# Patient Record
Sex: Female | Born: 1945
Health system: Southern US, Community
[De-identification: ages and names within clinical notes are randomized; demographics above are authoritative.]

## PROBLEM LIST (undated history)

## (undated) DIAGNOSIS — M1990 Unspecified osteoarthritis, unspecified site: Secondary | ICD-10-CM

## (undated) DIAGNOSIS — K219 Gastro-esophageal reflux disease without esophagitis: Secondary | ICD-10-CM

## (undated) DIAGNOSIS — R232 Flushing: Secondary | ICD-10-CM

## (undated) DIAGNOSIS — Z923 Personal history of irradiation: Secondary | ICD-10-CM

## (undated) DIAGNOSIS — E785 Hyperlipidemia, unspecified: Secondary | ICD-10-CM

## (undated) DIAGNOSIS — R7989 Other specified abnormal findings of blood chemistry: Secondary | ICD-10-CM

## (undated) DIAGNOSIS — L409 Psoriasis, unspecified: Secondary | ICD-10-CM

## (undated) DIAGNOSIS — F329 Major depressive disorder, single episode, unspecified: Secondary | ICD-10-CM

## (undated) DIAGNOSIS — C50919 Malignant neoplasm of unspecified site of unspecified female breast: Secondary | ICD-10-CM

## (undated) DIAGNOSIS — Z9289 Personal history of other medical treatment: Secondary | ICD-10-CM

## (undated) DIAGNOSIS — F32A Depression, unspecified: Secondary | ICD-10-CM

## (undated) DIAGNOSIS — I499 Cardiac arrhythmia, unspecified: Secondary | ICD-10-CM

## (undated) DIAGNOSIS — F419 Anxiety disorder, unspecified: Secondary | ICD-10-CM

## (undated) DIAGNOSIS — K589 Irritable bowel syndrome without diarrhea: Secondary | ICD-10-CM

## (undated) DIAGNOSIS — R12 Heartburn: Secondary | ICD-10-CM

## (undated) DIAGNOSIS — I10 Essential (primary) hypertension: Secondary | ICD-10-CM

## (undated) DIAGNOSIS — R61 Generalized hyperhidrosis: Secondary | ICD-10-CM

## (undated) DIAGNOSIS — R0989 Other specified symptoms and signs involving the circulatory and respiratory systems: Secondary | ICD-10-CM

## (undated) DIAGNOSIS — Z9889 Other specified postprocedural states: Secondary | ICD-10-CM

## (undated) HISTORY — PX: ABDOMINAL HYSTERECTOMY: SHX81

## (undated) HISTORY — PX: DILATION AND CURETTAGE OF UTERUS: SHX78

## (undated) HISTORY — DX: Irritable bowel syndrome, unspecified: K58.9

## (undated) HISTORY — PX: UPPER GASTROINTESTINAL ENDOSCOPY: SHX188

## (undated) HISTORY — DX: Personal history of other medical treatment: Z92.89

## (undated) HISTORY — DX: Unspecified osteoarthritis, unspecified site: M19.90

## (undated) HISTORY — DX: Gastro-esophageal reflux disease without esophagitis: K21.9

## (undated) HISTORY — DX: Other specified symptoms and signs involving the circulatory and respiratory systems: R09.89

## (undated) HISTORY — DX: Anxiety disorder, unspecified: F41.9

## (undated) HISTORY — DX: Flushing: R23.2

## (undated) HISTORY — PX: CATARACT EXTRACTION: SUR2

## (undated) HISTORY — DX: Major depressive disorder, single episode, unspecified: F32.9

## (undated) HISTORY — DX: Malignant neoplasm of unspecified site of unspecified female breast: C50.919

## (undated) HISTORY — DX: Depression, unspecified: F32.A

## (undated) HISTORY — DX: Psoriasis, unspecified: L40.9

## (undated) HISTORY — DX: Generalized hyperhidrosis: R61

## (undated) HISTORY — PX: COLONOSCOPY: SHX174

## (undated) HISTORY — PX: EYE SURGERY: SHX253

## (undated) HISTORY — DX: Heartburn: R12

## (undated) HISTORY — DX: Personal history of irradiation: Z92.3

## (undated) HISTORY — DX: Other specified abnormal findings of blood chemistry: R79.89

## (undated) HISTORY — DX: Other specified postprocedural states: Z98.890

## (undated) HISTORY — DX: Hyperlipidemia, unspecified: E78.5

## (undated) HISTORY — PX: TONSILLECTOMY AND ADENOIDECTOMY: SUR1326

## (undated) HISTORY — PX: HAND SURGERY: SHX662

---

## 2002-06-22 ENCOUNTER — Encounter
Admission: RE | Admit: 2002-06-22 | Discharge: 2002-09-16 | Payer: Self-pay | Admitting: Physical Medicine and Rehabilitation

## 2002-10-28 ENCOUNTER — Encounter
Admission: RE | Admit: 2002-10-28 | Discharge: 2002-12-30 | Payer: Self-pay | Admitting: Physical Medicine and Rehabilitation

## 2003-02-02 ENCOUNTER — Ambulatory Visit (HOSPITAL_COMMUNITY): Admission: RE | Admit: 2003-02-02 | Discharge: 2003-02-02 | Payer: Self-pay | Admitting: Gastroenterology

## 2003-12-14 ENCOUNTER — Encounter
Admission: RE | Admit: 2003-12-14 | Discharge: 2004-03-13 | Payer: Self-pay | Admitting: Physical Medicine and Rehabilitation

## 2004-04-25 ENCOUNTER — Encounter (INDEPENDENT_AMBULATORY_CARE_PROVIDER_SITE_OTHER): Payer: Self-pay | Admitting: *Deleted

## 2004-04-25 ENCOUNTER — Ambulatory Visit (HOSPITAL_COMMUNITY): Admission: RE | Admit: 2004-04-25 | Discharge: 2004-04-25 | Payer: Self-pay | Admitting: Gastroenterology

## 2007-06-09 ENCOUNTER — Other Ambulatory Visit: Admission: RE | Admit: 2007-06-09 | Discharge: 2007-06-09 | Payer: Self-pay | Admitting: Family Medicine

## 2007-07-07 ENCOUNTER — Encounter: Admission: RE | Admit: 2007-07-07 | Discharge: 2007-07-07 | Payer: Self-pay | Admitting: Family Medicine

## 2008-08-10 ENCOUNTER — Encounter: Admission: RE | Admit: 2008-08-10 | Discharge: 2008-08-10 | Payer: Self-pay | Admitting: Family Medicine

## 2008-08-16 ENCOUNTER — Encounter: Admission: RE | Admit: 2008-08-16 | Discharge: 2008-08-16 | Payer: Self-pay | Admitting: Family Medicine

## 2008-08-16 ENCOUNTER — Other Ambulatory Visit: Admission: RE | Admit: 2008-08-16 | Discharge: 2008-08-16 | Payer: Self-pay | Admitting: Family Medicine

## 2008-10-19 HISTORY — PX: NM MYOVIEW LTD: HXRAD82

## 2009-02-03 ENCOUNTER — Emergency Department (HOSPITAL_COMMUNITY): Admission: EM | Admit: 2009-02-03 | Discharge: 2009-02-03 | Payer: Self-pay | Admitting: Emergency Medicine

## 2009-08-18 ENCOUNTER — Encounter: Admission: RE | Admit: 2009-08-18 | Discharge: 2009-08-18 | Payer: Self-pay | Admitting: Family Medicine

## 2010-08-03 NOTE — Op Note (Signed)
NAMECLEMENCE, [name]                           ACCOUNT NO.:  [id number]   MEDICAL RECORD NO.:  [id number]                   PATIENT TYPE:  AMB   LOCATION:  ENDO                                 FACILITY:  [facility]   PHYSICIAN:  Graylin Shiver, M.D.                DATE OF BIRTH:  Mar 14, 1946   DATE OF PROCEDURE:  02/02/2003  DATE OF DISCHARGE:                                 OPERATIVE REPORT   PROCEDURE PERFORMED:  Colonoscopy.   INDICATIONS FOR PROCEDURE:  Intermittent rectal bleeding.   Informed consent was obtained after explanation of the risks of bleeding,  infection, and perforation.   PREMEDICATIONS:  Fentanyl 80 mcg  IV, Versed 7 mg IV.   DESCRIPTION OF PROCEDURE:  With the patient in the left lateral decubitus  position, a rectal exam was performed and no masses were felt.  The Olympus  pediatric adjustable colonoscope was inserted into the rectum and advanced  around a very tortuous colon to the cecum.  Cecal landmarks were identified.  The cecum and ascending colon were normal.  The transverse colon normal.  The descending colon, sigmoid and rectum were normal.  The patient tolerated  the procedure well without complications.   IMPRESSION:  Normal colonoscopy to the cecum.                                               Graylin Shiver, M.D.    SFG/MEDQ  D:  02/02/2003  T:  02/03/2003  Job:  [id number]   cc:   Al Decant. Janey Greaser, MD  [address]  [place]  [place] [address]  Fax: [phone]

## 2010-08-03 NOTE — Op Note (Signed)
[name], [name]               ACCOUNT NO.:  [id number]   MEDICAL RECORD NO.:  [id number]          PATIENT TYPE:  AMB   LOCATION:  ENDO                         FACILITY:  [facility]   PHYSICIAN:  Graylin Shiver, M.D.   DATE OF BIRTH:  Mar 11, 1946   DATE OF PROCEDURE:  04/25/2004  DATE OF DISCHARGE:                                 OPERATIVE REPORT   INDICATIONS:  Chest pain, rule out upper GI source.   Informed consent was obtained after explanation of the risks of bleeding,  infection and perforation.   PREMEDICATIONS:  Fentanyl 40 mcg IV, Versed 4 milligrams IV.   PROCEDURE:  With the patient in the left lateral decubitus position, the  Olympus gastroscope was inserted into the oropharynx and passed into the  esophagus. It was advanced down the esophagus and into the stomach and into  the duodenum. The second portion and bulb of the duodenum were normal. The  stomach showed a mild erythematous appearance to the mucosa compatible with  gastritis. No ulcers or erosions were seen. Biopsies were obtained for  histological inspection. No lesions were seen in the fundus or cardia of the  stomach on retroflexion of gastroscope. Scope was straightened and brought  back into the esophagus. The esophagogastric junction was at 40 cm, it  looked normal. The esophageal mucosa looked normal. She tolerated the  procedure well without complications.   IMPRESSION:  Mild gastritis.   I see nothing specifically on this examination to explain the patient's  chest pain. We will continue her on the proton pump inhibitors and also  Zelnorm which she is taking and follow her up in the office to see how she  is doing clinically.      SFG/MEDQ  D:  04/25/2004  T:  04/25/2004  Job:  [id number]   cc:   Al Decant. Janey Greaser, MD  [address]  [place]  [place] [address]  Fax: [phone]

## 2010-09-03 ENCOUNTER — Other Ambulatory Visit: Payer: Self-pay | Admitting: Family Medicine

## 2010-09-03 DIAGNOSIS — Z1231 Encounter for screening mammogram for malignant neoplasm of breast: Secondary | ICD-10-CM

## 2010-09-04 ENCOUNTER — Other Ambulatory Visit: Payer: Self-pay | Admitting: Family Medicine

## 2010-09-04 DIAGNOSIS — M8580 Other specified disorders of bone density and structure, unspecified site: Secondary | ICD-10-CM

## 2010-09-06 ENCOUNTER — Ambulatory Visit
Admission: RE | Admit: 2010-09-06 | Discharge: 2010-09-06 | Disposition: A | Discharge status: Home / Self Care | Payer: BC Managed Care – PPO | Source: Ambulatory Visit | Attending: Family Medicine | Admitting: Family Medicine

## 2010-09-06 DIAGNOSIS — M8580 Other specified disorders of bone density and structure, unspecified site: Secondary | ICD-10-CM

## 2010-09-06 DIAGNOSIS — Z1231 Encounter for screening mammogram for malignant neoplasm of breast: Secondary | ICD-10-CM

## 2011-03-19 DIAGNOSIS — Z923 Personal history of irradiation: Secondary | ICD-10-CM

## 2011-03-19 HISTORY — DX: Personal history of irradiation: Z92.3

## 2011-07-30 ENCOUNTER — Other Ambulatory Visit: Payer: Self-pay | Admitting: Family Medicine

## 2011-07-30 DIAGNOSIS — Z1231 Encounter for screening mammogram for malignant neoplasm of breast: Secondary | ICD-10-CM

## 2011-09-09 ENCOUNTER — Ambulatory Visit
Admission: RE | Admit: 2011-09-09 | Discharge: 2011-09-09 | Disposition: A | Discharge status: Home / Self Care | Payer: BC Managed Care – PPO | Source: Ambulatory Visit | Attending: Family Medicine | Admitting: Family Medicine

## 2011-09-09 DIAGNOSIS — Z1231 Encounter for screening mammogram for malignant neoplasm of breast: Secondary | ICD-10-CM

## 2011-09-11 ENCOUNTER — Other Ambulatory Visit: Payer: Self-pay | Admitting: Family Medicine

## 2011-09-11 DIAGNOSIS — R928 Other abnormal and inconclusive findings on diagnostic imaging of breast: Secondary | ICD-10-CM

## 2011-09-12 ENCOUNTER — Ambulatory Visit
Admission: RE | Admit: 2011-09-12 | Discharge: 2011-09-12 | Disposition: A | Discharge status: Home / Self Care | Payer: BC Managed Care – PPO | Source: Ambulatory Visit | Attending: Family Medicine | Admitting: Family Medicine

## 2011-09-12 ENCOUNTER — Other Ambulatory Visit: Payer: Self-pay | Admitting: Family Medicine

## 2011-09-12 DIAGNOSIS — R928 Other abnormal and inconclusive findings on diagnostic imaging of breast: Secondary | ICD-10-CM

## 2011-09-24 ENCOUNTER — Ambulatory Visit
Admission: RE | Admit: 2011-09-24 | Discharge: 2011-09-24 | Disposition: A | Discharge status: Home / Self Care | Payer: BC Managed Care – PPO | Source: Ambulatory Visit | Attending: Family Medicine | Admitting: Family Medicine

## 2011-09-24 DIAGNOSIS — R928 Other abnormal and inconclusive findings on diagnostic imaging of breast: Secondary | ICD-10-CM

## 2011-09-25 ENCOUNTER — Other Ambulatory Visit: Payer: Self-pay | Admitting: Family Medicine

## 2011-09-25 DIAGNOSIS — R928 Other abnormal and inconclusive findings on diagnostic imaging of breast: Secondary | ICD-10-CM

## 2011-09-26 ENCOUNTER — Ambulatory Visit
Admission: RE | Admit: 2011-09-26 | Discharge: 2011-09-26 | Disposition: A | Discharge status: Home / Self Care | Payer: BC Managed Care – PPO | Source: Ambulatory Visit | Attending: Family Medicine | Admitting: Family Medicine

## 2011-09-26 ENCOUNTER — Other Ambulatory Visit: Payer: Self-pay | Admitting: Family Medicine

## 2011-09-26 DIAGNOSIS — C50912 Malignant neoplasm of unspecified site of left female breast: Secondary | ICD-10-CM

## 2011-09-26 DIAGNOSIS — R928 Other abnormal and inconclusive findings on diagnostic imaging of breast: Secondary | ICD-10-CM

## 2011-09-27 ENCOUNTER — Other Ambulatory Visit: Payer: Self-pay | Admitting: *Deleted

## 2011-09-27 ENCOUNTER — Telehealth: Payer: Self-pay | Admitting: *Deleted

## 2011-09-27 DIAGNOSIS — C50419 Malignant neoplasm of upper-outer quadrant of unspecified female breast: Secondary | ICD-10-CM

## 2011-09-27 NOTE — Telephone Encounter (Signed)
Confirmed [facility] for 10/02/11 at 1200 .  Instructions and contact information given.

## 2011-09-30 ENCOUNTER — Ambulatory Visit
Admission: RE | Admit: 2011-09-30 | Discharge: 2011-09-30 | Disposition: A | Discharge status: Home / Self Care | Payer: BC Managed Care – PPO | Source: Ambulatory Visit | Attending: Family Medicine | Admitting: Family Medicine

## 2011-09-30 DIAGNOSIS — C50912 Malignant neoplasm of unspecified site of left female breast: Secondary | ICD-10-CM

## 2011-09-30 MED ORDER — GADOBENATE DIMEGLUMINE 529 MG/ML IV SOLN
12.0000 mL | Freq: Once | INTRAVENOUS | Status: AC | PRN
  Administered 2011-09-30: 12 mL via INTRAVENOUS

## 2011-10-02 ENCOUNTER — Encounter: Payer: Self-pay | Admitting: *Deleted

## 2011-10-02 ENCOUNTER — Encounter (INDEPENDENT_AMBULATORY_CARE_PROVIDER_SITE_OTHER): Payer: Self-pay | Admitting: General Surgery

## 2011-10-02 ENCOUNTER — Ambulatory Visit: Payer: BC Managed Care – PPO | Attending: General Surgery | Admitting: Physical Therapy

## 2011-10-02 ENCOUNTER — Ambulatory Visit (HOSPITAL_BASED_OUTPATIENT_CLINIC_OR_DEPARTMENT_OTHER): Payer: BC Managed Care – PPO

## 2011-10-02 ENCOUNTER — Other Ambulatory Visit: Payer: BC Managed Care – PPO | Admitting: Lab

## 2011-10-02 ENCOUNTER — Ambulatory Visit: Payer: BC Managed Care – PPO

## 2011-10-02 ENCOUNTER — Telehealth: Payer: Self-pay | Admitting: *Deleted

## 2011-10-02 ENCOUNTER — Encounter: Payer: Self-pay | Admitting: Oncology

## 2011-10-02 ENCOUNTER — Ambulatory Visit (HOSPITAL_BASED_OUTPATIENT_CLINIC_OR_DEPARTMENT_OTHER): Payer: BC Managed Care – PPO | Admitting: Oncology

## 2011-10-02 ENCOUNTER — Ambulatory Visit
Admission: RE | Admit: 2011-10-02 | Discharge: 2011-10-02 | Disposition: A | Discharge status: Home / Self Care | Payer: BC Managed Care – PPO | Source: Ambulatory Visit | Attending: Radiation Oncology | Admitting: Radiation Oncology

## 2011-10-02 ENCOUNTER — Ambulatory Visit (HOSPITAL_BASED_OUTPATIENT_CLINIC_OR_DEPARTMENT_OTHER): Payer: BC Managed Care – PPO | Admitting: General Surgery

## 2011-10-02 VITALS — BP 147/78 | HR 88 | Temp 97.9°F

## 2011-10-02 DIAGNOSIS — IMO0001 Reserved for inherently not codable concepts without codable children: Secondary | ICD-10-CM | POA: Insufficient documentation

## 2011-10-02 DIAGNOSIS — C50919 Malignant neoplasm of unspecified site of unspecified female breast: Secondary | ICD-10-CM | POA: Insufficient documentation

## 2011-10-02 DIAGNOSIS — M25619 Stiffness of unspecified shoulder, not elsewhere classified: Secondary | ICD-10-CM | POA: Insufficient documentation

## 2011-10-02 DIAGNOSIS — C50419 Malignant neoplasm of upper-outer quadrant of unspecified female breast: Secondary | ICD-10-CM

## 2011-10-02 DIAGNOSIS — M25519 Pain in unspecified shoulder: Secondary | ICD-10-CM | POA: Insufficient documentation

## 2011-10-02 LAB — COMPREHENSIVE METABOLIC PANEL WITH GFR
ALT: 10 U/L (ref 0–35)
AST: 15 U/L (ref 0–37)
Albumin: 3.8 g/dL (ref 3.5–5.2)
Alkaline Phosphatase: 73 U/L (ref 39–117)
BUN: 14 mg/dL (ref 6–23)
CO2: 28 meq/L (ref 19–32)
Calcium: 9.4 mg/dL (ref 8.4–10.5)
Chloride: 101 meq/L (ref 96–112)
Creatinine, Ser: 0.8 mg/dL (ref 0.50–1.10)
Glucose, Bld: 92 mg/dL (ref 70–99)
Potassium: 3.7 meq/L (ref 3.5–5.3)
Sodium: 139 meq/L (ref 135–145)
Total Bilirubin: 0.2 mg/dL — ABNORMAL LOW (ref 0.3–1.2)
Total Protein: 7.2 g/dL (ref 6.0–8.3)

## 2011-10-02 LAB — CBC WITH DIFFERENTIAL/PLATELET
EOS%: 6.3 % (ref 0.0–7.0)
Eosinophils Absolute: 0.4 10*3/uL (ref 0.0–0.5)
MCHC: 34 g/dL (ref 31.5–36.0)
MCV: 94.1 fL (ref 79.5–101.0)
Platelets: 293 10*3/uL (ref 145–400)
RBC: 4.3 10*6/uL (ref 3.70–5.45)

## 2011-10-02 MED ORDER — VENLAFAXINE HCL ER 37.5 MG PO CP24: 37.5000 mg | ORAL_CAPSULE | Freq: Every day | ORAL | Status: DC

## 2011-10-02 NOTE — Telephone Encounter (Signed)
Call from pt confirming appt time. Reviewed with pt, to arrive at 12:00pm  appt with Financial, lab then she will be seen by Dr.Khan, Dr. Mitzi Hansen & Dr. Carolynne Edouard. Pt confirmed appt time, verbalized understanding.

## 2011-10-02 NOTE — Telephone Encounter (Signed)
gave patient appointment for 11-05-2011 starting at 1:30pm printed out calendar and gave to the patient

## 2011-10-02 NOTE — Progress Notes (Signed)
[name] [id number] October 01, 1945 66 y.o. 10/02/2011 4:25 PM  CC Dr. Chevis Pretty  Dr. Dorothy Puffer Dr. Catha Gosselin Dr. [name]  REASON FOR CONSULTATION:  66 year old female with new diagnosis of invasive ductal carcinoma grade one of the left breast found on routine screening mammogram. Patient was seen in the Multidisciplinary Breast Clinic for discussion of her treatment options.   STAGE:   Cancer of upper-outer quadrant of female breast   Primary site: Breast (Left)   Staging method: AJCC 7th Edition   Clinical: Stage IA (T66mic, N0, cM0)   Summary: Stage IA (T72mic, N0, cM0)  REFERRING PHYSICIAN: Dr. Chevis Pretty  HISTORY OF PRESENT ILLNESS:  [name] is a 66 y.o. female.  With medical history significant for IBS arthritis ocular disease and gastroesophageal reflux disease. Patient recently presented for an annual screening mammogram on 09/10/2011. On this mammogram patient was found to have a new small cluster of pleomorphic microcalcifications located within the upper outer quadrant of the left breast measuring 3 mm. Because of this biopsy was recommended. Patient went on to have a core needle biopsy performed. The pathology showed invasive ductal carcinoma with mucinous features atypical ductal hyperplasia with calcifications and fibrocystic changes with calcifications. Prognostic markers could not be obtained to 2 small sample size. Patient went on to have MRI of the breasts performed on 09/30/2011. The MRI showed foci of nonspecific enhancement bilaterally there was about biopsy clip and a 1.4 cm postbiopsy hematoma in the upper outer quadrant of the left breast posteriorly no significant residual enhancement was seen in the upper outer quadrant of the left breast no suspicious mass or enhancement in either breast no axillary or internal mammary adenopathy.Patient has been on hormone replacement therapy and it was recommended today that she continue this as well.  Patient is  now seen in the multidisciplinary breast clinic for discussion of treatment options. Her case was presented at the multidisciplinary breast conference all of her pathology and images were reviewed at the conference and add pre-clinic conference.   Past Medical History: Past Medical History  Diagnosis Date  . IBS (irritable bowel syndrome)   . Arthritis   . Breast cancer   . GERD (gastroesophageal reflux disease)   . H/O colonoscopy   . H/O bone density study   . Night sweats   . Sinus complaint   . Heartburn   . Psoriasis   . Anxiety   . Depression   . Hot flashes     Past Surgical History: Past Surgical History  Procedure Date  . Cataract extraction   . Tonsillectomy and adenoidectomy   . Dilation and curettage of uterus   . Abdominal hysterectomy   . Hand surgery     RSD    Family History:Maternal cousin had colon cancer at the age of 34 and a paternal grandmother with breast cancer unknown stage. No family history on file.  Social History History  Substance Use Topics  . Smoking status: Former Smoker -- 1.0 packs/day  . Smokeless tobacco: Not on file  . Alcohol Use: 4.2 oz/week    7 Glasses of wine per week    Allergies: No Known Allergies  Current Medications: Current Outpatient Prescriptions  Medication Sig Dispense Refill  . BIOTIN PO Take 550 mg by mouth.      Marland Kitchen buPROPion (WELLBUTRIN SR) 150 MG 12 hr tablet Take 150 mg by mouth 2 (two) times daily.      . calcium carbonate 200 MG capsule Take 250  mg by mouth 2 (two) times daily with a meal.      . co-enzyme Q-10 30 MG capsule Take 300 mg by mouth daily.      . diclofenac sodium (VOLTAREN) 1 % GEL Apply 1 application topically.      Marland Kitchen estradiol (ESTRACE) 1 MG tablet Take 1 mg by mouth daily.      Marland Kitchen ezetimibe (ZETIA) 10 MG tablet Take 10 mg by mouth daily.      Marland Kitchen gabapentin (NEURONTIN) 100 MG capsule Take 100 mg by mouth as needed.      Marland Kitchen ibuprofen (ADVIL,MOTRIN) 200 MG tablet Take 200 mg by mouth every  6 (six) hours as needed.      Marland Kitchen Linaclotide (LINZESS) 145 MCG CAPS Take by mouth daily.      . Multiple Vitamin (MULTIVITAMIN) tablet Take 1 tablet by mouth daily.      . Omega-3 Fatty Acids (FISH OIL PO) Take 1 each by mouth.      Marland Kitchen omeprazole (PRILOSEC) 20 MG capsule Take 20 mg by mouth daily.      . quinapril-hydrochlorothiazide (ACCURETIC) 10-12.5 MG per tablet Take 1 tablet by mouth daily.      . rifaximin (XIFAXAN) 550 MG TABS Take 550 mg by mouth.      . tretinoin (RETIN-A) 0.05 % cream Apply topically at bedtime.      Marland Kitchen venlafaxine XR (EFFEXOR-XR) 37.5 MG 24 hr capsule Take 1 capsule (37.5 mg total) by mouth daily.  30 capsule  6    OB/GYN History:Menarche at age 9 patient underwent a hysterectomy at age 68 due to endometriosis. After that she began hormone replacement therapy and her discontinuation date is 10/02/2011. Patient has had 2 full term pregnancies starting at the age of 71.  Fertility Discussion:Patient is postmenopausal Prior History of Cancer:No prior history of cancers  Health Maintenance:  Colonoscopy yes Bone DensityYes yes in June 2012 Last PAP smearYes on June 2013  Patient began having her first mammogram starting at the age of 44 and yearly thereafter.  ECOG PERFORMANCE STATUS: 1 - Symptomatic but completely ambulatory  Genetic Counseling/testing: Patient is a postmenopausal female without significant family history of breast cancer and therefore genetic counseling and testing is not recommended at this time.  REVIEW OF SYSTEMS:  Constitutional: positive for night sweats Ears, nose, mouth, throat, and face: positive for patient has 2 retinal surgeries and has a macular hole as well as to cataract extractions she also has some gum disease and she does have some ringing in the ears at times. Respiratory: negative Cardiovascular: negative Gastrointestinal: positive for reflux symptoms and irritable bowel syndrome Integument/breast: positive for breast  tenderness and redness and ecchymosis at the site of biopsy Hematologic/lymphatic: negative Musculoskeletal:positive for arthralgias, back pain and stiff joints Neurological: positive for memory problems, weakness and numbness at times Behavioral/Psych: positive for anxiety and depression Endocrine: negative since coming off of the hormone replacement therapy patient has developed significant hot flashes. Patient also has complex regional pain syndrome (RSD)  PHYSICAL EXAMINATION: Blood pressure 147/78, pulse 88, temperature 97.9 F (36.6 C), temperature source Oral.  ZOX:WRUEA, well developed and anxious SKIN: skin color, texture, turgor are normal [name]: Normocephalic EYES: PERRLA, EOMI, Conjunctiva are pink and non-injected EARS: External ears normal OROPHARYNX:no exudate, no erythema, lips, buccal mucosa, and tongue normal and dentition normal  NECK: supple, no adenopathy, no bruits, no JVD, thyroid normal size, non-tender, without nodularity LYMPH:  no palpable lymphadenopathy, no hepatosplenomegaly BREAST:right breast normal without mass,  skin or nipple changes or axillary nodes, Left breast reveals area of hematoma is palpable. LUNGS: clear to auscultation and percussion HEART: regular rate & rhythm, no murmurs and no gallops ABDOMEN:abdomen soft, non-tender, normal bowel sounds and no masses or organomegaly BACK: Back symmetric, no curvature., No CVA tenderness EXTREMITIES:no edema, no clubbing, no cyanosis  NEURO: alert & oriented x 3 with fluent speech, no focal motor/sensory deficits, gait normal, reflexes normal and symmetric     STUDIES/RESULTS: Mr Breast Bilateral W Wo Contrast  09/30/2011  *RADIOLOGY REPORT*  Clinical Data: New diagnosis left sided breast cancer (small focus IDC with mucinous features and ADH/calcifications at the biopsy site).  BILATERAL BREAST MRI WITH AND WITHOUT CONTRAST  Technique: Multiplanar, multisequence MR images of both breasts were obtained  prior to and following the intravenous administration of 12ml of Multihance.  Three dimensional images were evaluated at the independent DynaCad workstation.  Comparison:  Multiple prior mammograms most recent screening mammogram  09/10/2011  Findings: Foci of nonspecific enhancement are seen bilaterally. Biopsy clip and a 1.4 cm postbiopsy hematoma and are seen in the upper-outer quadrant of the left breast, posteriorly.  No significant residual enhancement is seen in the upper outer quadrant of the left breast. No other suspicious mass or enhancement is seen in either breast.  There is no axillary or internal mammary adenopathy.  IMPRESSION: Known malignancy, left breast without significant residual enhancement seen on MRI.  No MRI specific evidence of malignancy, right breast.  RECOMMENDATION: Recommend treatment planning, left breast.  THREE-DIMENSIONAL MR IMAGE RENDERING ON INDEPENDENT WORKSTATION:  Three-dimensional MR images were rendered by post-processing of the original MR data on an independent workstation.  The three- dimensional MR images were interpreted, and findings were reported in the accompanying complete MRI report for this study.  BI-RADS CATEGORY 6:  Known biopsy-proven malignancy - appropriate action should be taken.  Original Report Authenticated By: Hiram Gash, M.D.   Mm Digital Diag Ltd L  09/12/2011  *RADIOLOGY REPORT*  Clinical Data:  Recall from screening mammography.  DIGITAL DIAGNOSTIC LEFT BREAST PILL MAMMOGRAM  Comparison:  None.  Findings:  IMPRESSION:  RECOMMENDATION:  *RADIOLOGY REPORT*  Clinical Data:  Recall from screening mammography.  DIGITAL DIAGNOSTIC LEFT BREAST MAMMOGRAM  Comparison:  09/09/2011, 09/12/2010, 09/06/2010, 08/18/2009, 08/16/2008 and 08/10/2008, 07/07/2007.  Findings:  There is a new small cluster of pleomorphic microcalcifications located within the upper-outer quadrant of the left breast which measures 3 mm in greatest dimension. Given the change  and appearance tissue sampling is recommended.  I have discussed stereotactic core biopsy with the patient.  This will be scheduled per patient preference.  A second smaller cluster of punctate calcifications is present more posteriorly located which appears stable when compared to prior studies.  IMPRESSION: Tiny (3 mm) cluster of calcifications located within the upper- outer quadrant of the left breast as discussed above.  Tissue sampling is recommended and stereotactic core biopsy will be scheduled.  RECOMMENDATION: Recommend stereotactic core biopsy of left breast calcifications.  BI-RADS CATEGORY 4:  Suspicious abnormality - biopsy should be considered.  Original Report Authenticated By: Rolla Plate, M.D.   Mm Digital Screening  09/10/2011  *RADIOLOGY REPORT*  Clinical Data: Screening.  DIGITAL SCREENING MAMMOGRAM WITH CAD  DIGITAL BREAST TOMOSYNTHESIS  Digital breast tomosynthesis images are acquired in two projections.  These images are reviewed in combination with the digital mammogram, confirming the findings below.  Comparison:  Prior exams  Findings: Two views of each breast demonstrate heterogeneously dense tissue .  In the left breast, calcifications warrant further evaluation with magnified views.  In the right breast, no mass or malignant type calcifications are identified.  Images were processed with CAD.  IMPRESSION: Further evaluation is suggested for calcifications in the left breast.  RECOMMENDATION: Diagnostic mammogram of the left breast. (Code:FI-L-50M)  BI-RADS CATEGORY 0:  Incomplete.  Need additional imaging evaluation and/or prior mammograms for comparison.  Original Report Authenticated By: Valentina Gu Radiologist Eval And Mgmt  09/26/2011  *RADIOLOGY REPORT*  ESTABLISHED PATIENT OFFICE VISIT - LEVEL II ([id number])  Chief Complaint:  The patient returns with her husband today for pathology results of of left breast biopsy.  History:  Calcifications identified in the left breast on  recent screening mammogram.  Biopsy is recommended.The patient reports no problems at the biopsy site.  Exam:  The biopsy site is clean and dry.  A new Band-Aid was applied.  There is no bruising or hematoma.  Pathology: Pathology results show invasive ductal carcinoma with mucinous features.  Atypical ductal hyperplasia with calcifications, and fibrocystic changes with calcifications.  The pathologist notes that the carcinoma identified on biopsy is quite small.  Assessment and Plan:  The patient will be seen in the multidisciplinary breast cancer clinic at [facility] on 10/02/2011.  Breast MRI scheduled for 09/30/2011.  Their questions were answered, and the patient was given an Public house manager.  Original Report Authenticated By: Britta Mccreedy, M.D.     LABS:    Chemistry   No results found for this basename: NA, K, CL, CO2, BUN, CREATININE, GLU   No results found for this basename: CALCIUM, ALKPHOS, AST, ALT, BILITOT      Lab Results  Component Value Date   WBC 6.7 10/02/2011   HGB 13.8 10/02/2011   HCT 40.4 10/02/2011   MCV 94.1 10/02/2011   PLT 293 10/02/2011    PATHOLOGY:  Collection Date: 09/24/2011 Patient Name: [name], [name] Received Date: 09/24/2011 Case No: [id number] Physician cc: Joycelyn Rua, MD MRN #: [id number] Chart #: [id number] DOB: 1945/11/20 Age: 56 Gender: F Client Name: [facility] Physician: Stoney Bang, MD REPORT OF SURGICAL PATHOLOGY ADDITIONAL INFORMATION: PROGNOSTIC INDICATORS - ACIS Results There is insufficient carcinoma present for analysis. All controls stained appropriately Pecola Leisure MD Pathologist, Electronic Signature ( Signed 10/02/2011) CHROMOGENIC IN-SITU HYBRIDIZATION Interpretation: There is insufficient carcinoma present for Her2 analysis. Reference range: Ratio: HER2:CEP17 < 1.8 - gene amplification not observed Ratio: HER2:CEP 17 1.8-2.2 - equivocal result Ratio: HER2:CEP17 > 2.2 -  gene amplification observed Pecola Leisure MD Pathologist, Electronic Signature ( Signed 10/01/2011) FINAL DIAGNOSIS 1 of 3 FINAL for [name], [name] ([id number]) Diagnosis Breast, left, needle core biopsy, UOQ - INVASIVE DUCTAL CARCINOMA WITH MUCINOUS FEATURES. - ATYPICAL HYPERPLASIA WITH ASSOCIATED CALCIFICATION. - FIBROCYSTIC CHANGES WITH ASSOCIATED CALCIFICATION. - SEE COMMENT. Microscopic Comment Although definitive grading of breast carcinoma is best done on excision, the features of the tumor from the left upper outer quadrant needle biopsy are compatible with a grade I breast carcinoma with mucinous features. Very little tumor is present on histologic examination. A breast prognostic profile will be attempted, although, the paucity of tumor may be quantitatively insufficient. The findings are called to [facility] on 09/26/11. Both Dr. Colonel Bald and Dr. Frederica Kuster have seen this case in consultation with agreement that there is invasive ductal carcinoma present. (RAH:gt, 09/26/11) Zandra Abts MD Pathologist, Electronic Signature (Case signed 09/26/2011)    ASSESSMENT    66 year old female with  new diagnosis of small invasive ductal carcinoma grade 1 of the left breast. Patient had screening mammogram performed that showed a small 3 mm area of calcifications. Patient's tumors is grade 1. Prognostic markers were not performed as there was not enough tissue. The ligaments he did arise in the setting of atypical ductal hyperplasia. Patient today is seen in the multidisciplinary breast clinic To discuss her treatment options. She was seen by surgery as well as radiation oncology. Her case was also presented at the multidisciplinary breast conference. The consensus was her guidelines that patient would undergo lumpectomy followed by radiation therapy. If there was enough tissue we would do a prognostic panel on that including Estrogen receptor progesterone receptor HER-2/neu.  Based on on those results I would recommend antiestrogen therapy if the estrogen receptor were positive.  I have discussed the rationale with the patient as well as her family regarding this. Also of note patient does have invasive cancer arising in the setting of atypical ductal hyperplasia. She certainly would be in the high-risk category and even if her prognostic markers were negative or could not be performed due to lack of tissue she still would be eligible for chemoprevention with tamoxifen. And we did extensively discussed the rationale for this.  PLAN:    #1 patient will proceed with lumpectomy and sentinel lymph node biopsy.  #2 once her final pathology is back I will plan on seeing her in followup in my clinic.  #3 patient will be seen by Dr. Mitzi Hansen as well post surgery.  #4 patient is recommended to discontinue hormone replacement therapy.  #5 to treat her hot flashes I recommended a trial of Effexor 37.5 mg on a daily basis. Patient is also on BuSpar and we will wean this down.     Thank you so much for allowing me to participate in the care of [facility]. I will continue to follow up the patient with you and assist in her care.  All questions were answered. The patient knows to call the clinic with any problems, questions or concerns. We can certainly see the patient much sooner if necessary.  I spent 60 minutes counseling the patient face to face. The total time spent in the appointment was 60 minutes.  Drue Second, MD Medical/Oncology [facility] [phone] (beeper) [phone] (Office)  10/02/2011, 4:25 PM

## 2011-10-02 NOTE — Patient Instructions (Addendum)
1. Surgery first  2. I will see you back in 4 weeks

## 2011-10-02 NOTE — Progress Notes (Addendum)
Subjective:     Patient ID: [name], female   DOB: Jan 12, 1946, 66 y.o.   MRN: [id number]  HPI We are asked to see the patient in consultation by Dr. Azucena Kuba to evaluate her for a left breast cancer. The patient is a 66 year old white female who has been having some soreness in the upper outer portion of her left breast off and on for a number of years. She recently went for a routine screening mammogram at which time a 3 mm area of calcification was seen in the upper outer left breast. This was biopsied and came back as invasive ductal cancer of low grade. She denies any discharge from her nipple on either side. She is taking estrogen. She has not had any breast problems in the past. She has not had any family history of breast problems either.  Review of Systems  Constitutional: Negative.   HENT: Negative.   Eyes: Negative.   Respiratory: Negative.   Cardiovascular: Negative.   Gastrointestinal: Negative.   Genitourinary: Negative.   Musculoskeletal: Negative.   Skin: Negative.   Neurological: Negative.   Hematological: Negative.   Psychiatric/Behavioral: Negative.        Objective:   Physical Exam  Constitutional: She is oriented to person, place, and time. She appears well-developed and well-nourished.  HENT:  Head: Normocephalic and atraumatic.  Eyes: Conjunctivae and EOM are normal. Pupils are equal, round, and reactive to light.  Neck: Normal range of motion. Neck supple.  Cardiovascular: Normal rate, regular rhythm and normal heart sounds.   Pulmonary/Chest: Effort normal and breath sounds normal.       There is no significant palpable mass in either breast. There is no palpable axillary or supraclavicular cervical lymphadenopathy.  Abdominal: Soft. Bowel sounds are normal. She exhibits no mass. There is no tenderness.  Musculoskeletal: Normal range of motion.  Lymphadenopathy:    She has no cervical adenopathy.  Neurological: She is alert and oriented to person, place,  and time.  Skin: Skin is warm and dry.  Psychiatric: She has a normal mood and affect. Her behavior is normal.       Assessment:     The patient has what appears to be a very small early stage breast cancer in the upper left breast. I've discussed in detail the options for her treatment and she has elected to have breast conservation and sentinel node mapping. I think this is a good choice. I have discussed her in detail the risks and benefits of the operation to remove the cancer as well as some of the technical aspects and she understands and wishes to proceed.    Plan:     Plan for left breast wire localized lumpectomy and sentinel node mapping

## 2011-10-03 ENCOUNTER — Other Ambulatory Visit (INDEPENDENT_AMBULATORY_CARE_PROVIDER_SITE_OTHER): Payer: Self-pay | Admitting: General Surgery

## 2011-10-03 ENCOUNTER — Telehealth (INDEPENDENT_AMBULATORY_CARE_PROVIDER_SITE_OTHER): Payer: Self-pay | Admitting: General Surgery

## 2011-10-03 DIAGNOSIS — C50419 Malignant neoplasm of upper-outer quadrant of unspecified female breast: Secondary | ICD-10-CM

## 2011-10-03 NOTE — Telephone Encounter (Signed)
Spoke with pt and went ahead and gave her PO appt.  It is on 8/20 at 10:00.  I also sent a reminder card in the mail.  She was fine with this.

## 2011-10-04 ENCOUNTER — Encounter: Payer: Self-pay | Admitting: *Deleted

## 2011-10-04 NOTE — Progress Notes (Signed)
Mailed after appt letter to pt. 

## 2011-10-05 NOTE — Progress Notes (Signed)
[facility] Radiation Oncology NEW PATIENT EVALUATION  Name: [name] MRN: [id number]  Date:   10/02/2011           DOB: 05/25/1945  Status: outpatient   CC: No primary provider on file.  Robyne Askew, MD    REFERRING PHYSICIAN: Robyne Askew, MD   DIAGNOSIS: The encounter diagnosis was Cancer of upper-outer quadrant of female breast.    HISTORY OF PRESENT ILLNESS:  [name] is a 66 y.o. female who is seen today regarding a new diagnosis of invasive ductal carcinoma of the left breast. The patient was noted to have some suspicious microcalcifications within the upper outer quadrant of the left breast on recent screening mammogram. This area was quite small at 3 mm. A biopsy was recommended and the pathology showed an invasive ductal carcinoma with mucinous features. The sample was too small for prognostic markers to be obtained. Therefore these are not available. The patient did proceed with an MRI scan of the breasts bilaterally. This showed some postbiopsy changes consistent with a hematoma. This was present within the upper outer quadrant. No other suspicious masses are present in either breast and no adenopathy. The patient's case was discussed at multidisciplinary breast clinic this morning and she is seen today for discussion of possible adjuvant radiotherapy as part of her overall treatment plan.  PREVIOUS RADIATION THERAPY: No   PAST MEDICAL HISTORY:  has a past medical history of IBS (irritable bowel syndrome); Arthritis; Breast cancer; GERD (gastroesophageal reflux disease); H/O colonoscopy; H/O bone density study; Night sweats; Sinus complaint; Heartburn; Psoriasis; Anxiety; Depression; and Hot flashes.     PAST SURGICAL HISTORY:  Past Surgical History  Procedure Date  . Cataract extraction   . Tonsillectomy and adenoidectomy   . Dilation and curettage of uterus   . Abdominal hysterectomy   . Hand surgery     RSD     FAMILY HISTORY: family  history is not on file.   SOCIAL HISTORY:  reports that she has quit smoking. She does not have any smokeless tobacco history on file. She reports that she drinks about 4.2 ounces of alcohol per week. She reports that she does not use illicit drugs.   ALLERGIES: Review of patient's allergies indicates no known allergies.   MEDICATIONS:  Current Outpatient Prescriptions  Medication Sig Dispense Refill  . BIOTIN PO Take 550 mg by mouth.      Marland Kitchen buPROPion (WELLBUTRIN SR) 150 MG 12 hr tablet Take 150 mg by mouth 2 (two) times daily.      . calcium carbonate 200 MG capsule Take 250 mg by mouth 2 (two) times daily with a meal.      . co-enzyme Q-10 30 MG capsule Take 300 mg by mouth daily.      . diclofenac sodium (VOLTAREN) 1 % GEL Apply 1 application topically.      Marland Kitchen estradiol (ESTRACE) 1 MG tablet Take 1 mg by mouth daily.      Marland Kitchen ezetimibe (ZETIA) 10 MG tablet Take 10 mg by mouth daily.      Marland Kitchen gabapentin (NEURONTIN) 100 MG capsule Take 100 mg by mouth as needed.      Marland Kitchen ibuprofen (ADVIL,MOTRIN) 200 MG tablet Take 200 mg by mouth every 6 (six) hours as needed.      Marland Kitchen Linaclotide (LINZESS) 145 MCG CAPS Take by mouth daily.      . Multiple Vitamin (MULTIVITAMIN) tablet Take 1 tablet by mouth daily.      Marland Kitchen  Omega-3 Fatty Acids (FISH OIL PO) Take 1 each by mouth.      Marland Kitchen omeprazole (PRILOSEC) 20 MG capsule Take 20 mg by mouth daily.      . quinapril-hydrochlorothiazide (ACCURETIC) 10-12.5 MG per tablet Take 1 tablet by mouth daily.      . rifaximin (XIFAXAN) 550 MG TABS Take 550 mg by mouth.      . tretinoin (RETIN-A) 0.05 % cream Apply topically at bedtime.      Marland Kitchen venlafaxine XR (EFFEXOR-XR) 37.5 MG 24 hr capsule Take 1 capsule (37.5 mg total) by mouth daily.  30 capsule  6     REVIEW OF SYSTEMS:  A comprehensive review of systems was negative.    PHYSICAL EXAM:  vitals were not taken for this visit.  General: Well-developed, in no acute distress HEENT: Normocephalic, atraumatic; oral  cavity clear Neck: Supple without any lymphadenopathy Cardiovascular: Regular rate and rhythm Respiratory: Clear to auscultation bilaterally Breasts: Post biopsy change present within the upper outer quadrant of the left breast. No axillary adenopathy on the left. No suspicious findings within the right breast or right axilla. GI: Soft, nontender, normal bowel sounds Extremities: No edema present Neuro: No focal deficits    LABORATORY DATA:  Lab Results  Component Value Date   WBC 6.7 10/02/2011   HGB 13.8 10/02/2011   HCT 40.4 10/02/2011   MCV 94.1 10/02/2011   PLT 293 10/02/2011   Lab Results  Component Value Date   NA 139 10/02/2011   K 3.7 10/02/2011   CL 101 10/02/2011   CO2 28 10/02/2011   Lab Results  Component Value Date   ALT 10 10/02/2011   AST 15 10/02/2011   ALKPHOS 73 10/02/2011   BILITOT 0.2* 10/02/2011      IMPRESSION: Pleasant 66 year old female with a stage I invasive ductal carcinoma of the left breast.   PLAN: The patient's case has been discussed at breast conference and she is seen today in multidisciplinary rest clinic. Our recommendation is that she is a good candidate for breast conservation treatment and the patient is interested in a lumpectomy. I would recommend adjuvant radiotherapy after this treatment. The patient has also seen Dr. Park Breed regarding systemic treatment options. She has taken hormonal replacement therapy and it is recommended that this is discontinued. Further workup will be required on the surgical specimen and Dr. Dionne Milo will be able to make a final decision postoperatively, especially since prognostic markers were not able to be completed on the original biopsy specimens.  I would therefore like to see the patient back postoperatively to review her case at that time and to review her final surgical results. We will then further discuss radiation treatment and coordinate this as appropriate. All of her questions were answered and we did discuss  in some detail what is typical for external beam radiation treatment and the typical side effects and risks.   I spent 60 minutes minutes face to face with the patient and more than 50% of that time was spent in counseling and/or coordination of care.

## 2011-10-07 ENCOUNTER — Telehealth: Payer: Self-pay | Admitting: *Deleted

## 2011-10-07 NOTE — Telephone Encounter (Signed)
Spoke to pt concerning [facility] from 7/17.  Further discussed her treatment care plan and dx.  Informed pt that we will have more information on ER/PR status after surgery.  Confirmed future appts and surgery date.  Encourage pt to call with further needs.  Received verbal understanding.  Contact information given.

## 2011-10-07 NOTE — Telephone Encounter (Signed)
Left vm for pt to return call regarding BMDC from 10/02/11. 

## 2011-10-10 ENCOUNTER — Other Ambulatory Visit (HOSPITAL_COMMUNITY): Payer: BC Managed Care – PPO

## 2011-10-14 MED ORDER — EPOETIN ALFA 20000 UNIT/ML IJ SOLN
INTRAMUSCULAR | Status: AC
  Filled 2011-10-14: qty 1

## 2011-10-15 ENCOUNTER — Encounter (HOSPITAL_BASED_OUTPATIENT_CLINIC_OR_DEPARTMENT_OTHER): Payer: Self-pay | Admitting: *Deleted

## 2011-10-15 NOTE — Progress Notes (Signed)
To come in for bmet- Sees dr little for palpitations-called for ekg

## 2011-10-17 ENCOUNTER — Encounter (HOSPITAL_BASED_OUTPATIENT_CLINIC_OR_DEPARTMENT_OTHER)
Admission: RE | Admit: 2011-10-17 | Discharge: 2011-10-17 | Disposition: A | Discharge status: Home / Self Care | Payer: BC Managed Care – PPO | Source: Ambulatory Visit | Attending: General Surgery | Admitting: General Surgery

## 2011-10-17 LAB — BASIC METABOLIC PANEL
Calcium: 9.2 mg/dL (ref 8.4–10.5)
Chloride: 98 mEq/L (ref 96–112)
Creatinine, Ser: 0.73 mg/dL (ref 0.50–1.10)
GFR calc Af Amer: >90 mL/min (ref >90)
Sodium: 137 mEq/L (ref 135–145)

## 2011-10-18 ENCOUNTER — Encounter (HOSPITAL_BASED_OUTPATIENT_CLINIC_OR_DEPARTMENT_OTHER): Payer: Self-pay | Admitting: *Deleted

## 2011-10-18 ENCOUNTER — Encounter (HOSPITAL_BASED_OUTPATIENT_CLINIC_OR_DEPARTMENT_OTHER)
Admission: RE | Disposition: A | Discharge status: Home / Self Care | Payer: Self-pay | Source: Ambulatory Visit | Attending: General Surgery

## 2011-10-18 ENCOUNTER — Ambulatory Visit (HOSPITAL_BASED_OUTPATIENT_CLINIC_OR_DEPARTMENT_OTHER)
Admission: RE | Admit: 2011-10-18 | Discharge: 2011-10-18 | Disposition: A | Discharge status: Home / Self Care | Payer: BC Managed Care – PPO | Source: Ambulatory Visit | Attending: General Surgery | Admitting: General Surgery

## 2011-10-18 ENCOUNTER — Ambulatory Visit
Admission: RE | Admit: 2011-10-18 | Discharge: 2011-10-18 | Disposition: A | Discharge status: Home / Self Care | Payer: BC Managed Care – PPO | Source: Ambulatory Visit | Attending: General Surgery | Admitting: General Surgery

## 2011-10-18 ENCOUNTER — Ambulatory Visit (HOSPITAL_BASED_OUTPATIENT_CLINIC_OR_DEPARTMENT_OTHER): Payer: BC Managed Care – PPO | Admitting: Certified Registered"

## 2011-10-18 ENCOUNTER — Ambulatory Visit (HOSPITAL_COMMUNITY)
Admission: RE | Admit: 2011-10-18 | Discharge: 2011-10-18 | Disposition: A | Discharge status: Home / Self Care | Payer: BC Managed Care – PPO | Source: Ambulatory Visit | Attending: General Surgery | Admitting: General Surgery

## 2011-10-18 ENCOUNTER — Encounter (HOSPITAL_BASED_OUTPATIENT_CLINIC_OR_DEPARTMENT_OTHER): Payer: Self-pay | Admitting: Certified Registered"

## 2011-10-18 DIAGNOSIS — C50419 Malignant neoplasm of upper-outer quadrant of unspecified female breast: Secondary | ICD-10-CM

## 2011-10-18 DIAGNOSIS — C50919 Malignant neoplasm of unspecified site of unspecified female breast: Secondary | ICD-10-CM

## 2011-10-18 DIAGNOSIS — K219 Gastro-esophageal reflux disease without esophagitis: Secondary | ICD-10-CM | POA: Insufficient documentation

## 2011-10-18 DIAGNOSIS — I10 Essential (primary) hypertension: Secondary | ICD-10-CM | POA: Insufficient documentation

## 2011-10-18 DIAGNOSIS — D0590 Unspecified type of carcinoma in situ of unspecified breast: Secondary | ICD-10-CM

## 2011-10-18 HISTORY — DX: Essential (primary) hypertension: I10

## 2011-10-18 HISTORY — PX: BREAST SURGERY: SHX581

## 2011-10-18 HISTORY — DX: Cardiac arrhythmia, unspecified: I49.9

## 2011-10-18 HISTORY — DX: Malignant neoplasm of unspecified site of unspecified female breast: C50.919

## 2011-10-18 LAB — POCT HEMOGLOBIN-HEMACUE: Hemoglobin: 13 g/dL (ref 12.0–15.0)

## 2011-10-18 SURGERY — BREAST LUMPECTOMY WITH NEEDLE LOCALIZATION AND AXILLARY SENTINEL LYMPH NODE BX
Anesthesia: General | Site: Breast | Laterality: Left | Wound class: Clean

## 2011-10-18 MED ORDER — ONDANSETRON HCL 4 MG/2ML IJ SOLN
INTRAMUSCULAR | Status: DC | PRN
  Administered 2011-10-18: 4 mg via INTRAVENOUS

## 2011-10-18 MED ORDER — MIDAZOLAM HCL 2 MG/2ML IJ SOLN: 0.5000 mg | Freq: Once | INTRAMUSCULAR | Status: DC | PRN

## 2011-10-18 MED ORDER — TECHNETIUM TC 99M SULFUR COLLOID FILTERED
1.0000 | Freq: Once | INTRAVENOUS | Status: AC | PRN
  Administered 2011-10-18: 1 via INTRADERMAL

## 2011-10-18 MED ORDER — LACTATED RINGERS IV SOLN
INTRAVENOUS | Status: DC
  Administered 2011-10-18 (×2): via INTRAVENOUS

## 2011-10-18 MED ORDER — MEPERIDINE HCL 25 MG/ML IJ SOLN: 6.2500 mg | INTRAMUSCULAR | Status: DC | PRN

## 2011-10-18 MED ORDER — FENTANYL CITRATE 0.05 MG/ML IJ SOLN
INTRAMUSCULAR | Status: DC | PRN
  Administered 2011-10-18: 50 ug via INTRAVENOUS

## 2011-10-18 MED ORDER — LIDOCAINE HCL (CARDIAC) 20 MG/ML IV SOLN
INTRAVENOUS | Status: DC | PRN
  Administered 2011-10-18: 40 mg via INTRAVENOUS

## 2011-10-18 MED ORDER — FENTANYL CITRATE 0.05 MG/ML IJ SOLN
50.0000 ug | INTRAMUSCULAR | Status: DC | PRN
  Administered 2011-10-18: 100 ug via INTRAVENOUS

## 2011-10-18 MED ORDER — HYDROCODONE-ACETAMINOPHEN 5-325 MG PO TABS: 1.0000 | ORAL_TABLET | Freq: Four times a day (QID) | ORAL | Status: AC | PRN

## 2011-10-18 MED ORDER — PROPOFOL 10 MG/ML IV EMUL
INTRAVENOUS | Status: DC | PRN
  Administered 2011-10-18: 130 mg via INTRAVENOUS

## 2011-10-18 MED ORDER — HYDROMORPHONE HCL PF 1 MG/ML IJ SOLN
0.2500 mg | INTRAMUSCULAR | Status: DC | PRN
  Administered 2011-10-18: 0.5 mg via INTRAVENOUS
  Administered 2011-10-18: 0.25 mg via INTRAVENOUS
  Administered 2011-10-18 (×2): 0.5 mg via INTRAVENOUS

## 2011-10-18 MED ORDER — CHLORHEXIDINE GLUCONATE 4 % EX LIQD: 1.0000 "application " | Freq: Once | CUTANEOUS | Status: DC

## 2011-10-18 MED ORDER — SODIUM CHLORIDE 0.9 % IJ SOLN
INTRAMUSCULAR | Status: DC | PRN
  Administered 2011-10-18: 10:00:00 via INTRAMUSCULAR

## 2011-10-18 MED ORDER — BUPIVACAINE HCL (PF) 0.25 % IJ SOLN
INTRAMUSCULAR | Status: DC | PRN
  Administered 2011-10-18: 25 mL

## 2011-10-18 MED ORDER — PROMETHAZINE HCL 25 MG/ML IJ SOLN: 6.2500 mg | INTRAMUSCULAR | Status: DC | PRN

## 2011-10-18 MED ORDER — CEFAZOLIN SODIUM-DEXTROSE 2-3 GM-% IV SOLR
2.0000 g | INTRAVENOUS | Status: AC
  Administered 2011-10-18: 2 g via INTRAVENOUS

## 2011-10-18 MED ORDER — DEXAMETHASONE SODIUM PHOSPHATE 4 MG/ML IJ SOLN
INTRAMUSCULAR | Status: DC | PRN
  Administered 2011-10-18: 8 mg via INTRAVENOUS

## 2011-10-18 MED ORDER — MIDAZOLAM HCL 2 MG/2ML IJ SOLN
1.0000 mg | INTRAMUSCULAR | Status: DC | PRN
  Administered 2011-10-18: 2 mg via INTRAVENOUS

## 2011-10-18 MED ORDER — SUCCINYLCHOLINE CHLORIDE 20 MG/ML IJ SOLN
INTRAMUSCULAR | Status: DC | PRN
  Administered 2011-10-18: 100 mg via INTRAVENOUS

## 2011-10-18 SURGICAL SUPPLY — 52 items
ADH SKN CLS APL DERMABOND .7 (GAUZE/BANDAGES/DRESSINGS) ×2
APPLIER CLIP 11 MED OPEN (CLIP) ×2
APR CLP MED 11 20 MLT OPN (CLIP) ×1
BLADE SURG 10 STRL SS (BLADE) ×2 IMPLANT
BLADE SURG 15 STRL LF DISP TIS (BLADE) ×1 IMPLANT
BLADE SURG 15 STRL SS (BLADE) ×2
CANISTER SUCTION 1200CC (MISCELLANEOUS) ×2 IMPLANT
CHLORAPREP W/TINT 26ML (MISCELLANEOUS) ×2 IMPLANT
CLIP APPLIE 11 MED OPEN (CLIP) IMPLANT
CLOTH BEACON ORANGE TIMEOUT ST (SAFETY) ×2 IMPLANT
COVER MAYO STAND STRL (DRAPES) ×2 IMPLANT
COVER PROBE W GEL 5X96 (DRAPES) ×2 IMPLANT
COVER TABLE BACK 60X90 (DRAPES) ×2 IMPLANT
DECANTER SPIKE VIAL GLASS SM (MISCELLANEOUS) ×1 IMPLANT
DERMABOND ADVANCED (GAUZE/BANDAGES/DRESSINGS) ×2
DERMABOND ADVANCED .7 DNX12 (GAUZE/BANDAGES/DRESSINGS) ×2 IMPLANT
DEVICE DUBIN W/COMP PLATE 8390 (MISCELLANEOUS) ×2 IMPLANT
DRAIN CHANNEL 19F RND (DRAIN) IMPLANT
DRAPE LAPAROSCOPIC ABDOMINAL (DRAPES) ×2 IMPLANT
DRAPE UTILITY XL STRL (DRAPES) ×2 IMPLANT
ELECT COATED BLADE 2.86 ST (ELECTRODE) ×3 IMPLANT
ELECT REM PT RETURN 9FT ADLT (ELECTROSURGICAL) ×2
ELECTRODE REM PT RTRN 9FT ADLT (ELECTROSURGICAL) ×1 IMPLANT
EVACUATOR SILICONE 100CC (DRAIN) IMPLANT
GLOVE BIO SURGEON STRL SZ7.5 (GLOVE) ×3 IMPLANT
GLOVE ECLIPSE 6.5 STRL STRAW (GLOVE) ×1 IMPLANT
GOWN PREVENTION PLUS XLARGE (GOWN DISPOSABLE) ×3 IMPLANT
KIT MARKER MARGIN INK (KITS) ×1 IMPLANT
NDL HYPO 25X1 1.5 SAFETY (NEEDLE) ×2 IMPLANT
NDL SAFETY ECLIPSE 18X1.5 (NEEDLE) ×1 IMPLANT
NEEDLE HYPO 18GX1.5 SHARP (NEEDLE) ×2
NEEDLE HYPO 25X1 1.5 SAFETY (NEEDLE) ×4 IMPLANT
NS IRRIG 1000ML POUR BTL (IV SOLUTION) ×2 IMPLANT
PACK BASIN DAY SURGERY FS (CUSTOM PROCEDURE TRAY) ×2 IMPLANT
PENCIL BUTTON HOLSTER BLD 10FT (ELECTRODE) ×3 IMPLANT
PIN SAFETY STERILE (MISCELLANEOUS) IMPLANT
SLEEVE SCD COMPRESS KNEE MED (MISCELLANEOUS) ×2 IMPLANT
SPONGE LAP 18X18 X RAY DECT (DISPOSABLE) ×2 IMPLANT
SPONGE LAP 4X18 X RAY DECT (DISPOSABLE) IMPLANT
STAPLER VISISTAT 35W (STAPLE) IMPLANT
SUT ETHILON 3 0 FSL (SUTURE) IMPLANT
SUT MON AB 4-0 PC3 18 (SUTURE) ×4 IMPLANT
SUT SILK 3 0 PS 1 (SUTURE) IMPLANT
SUT VIC AB 3-0 54X BRD REEL (SUTURE) ×2 IMPLANT
SUT VIC AB 3-0 BRD 54 (SUTURE) ×4
SUT VICRYL 3-0 CR8 SH (SUTURE) ×2 IMPLANT
SYR CONTROL 10ML LL (SYRINGE) ×4 IMPLANT
TOWEL OR 17X24 6PK STRL BLUE (TOWEL DISPOSABLE) ×3 IMPLANT
TOWEL OR NON WOVEN STRL DISP B (DISPOSABLE) ×2 IMPLANT
TUBE CONNECTING 20X1/4 (TUBING) ×2 IMPLANT
WATER STERILE IRR 1000ML POUR (IV SOLUTION) ×1 IMPLANT
YANKAUER SUCT BULB TIP NO VENT (SUCTIONS) ×2 IMPLANT

## 2011-10-18 NOTE — Transfer of Care (Signed)
Immediate Anesthesia Transfer of Care Note  Patient: [name]  Procedure(s) Performed: Procedure(s) (LRB): BREAST LUMPECTOMY WITH NEEDLE LOCALIZATION AND AXILLARY SENTINEL LYMPH NODE BX (Left)  Patient Location: PACU  Anesthesia Type: General  Level of Consciousness: awake  Airway & Oxygen Therapy: Patient Spontanous Breathing and Patient connected to face mask oxygen  Post-op Assessment: Report given to PACU RN and Post -op Vital signs reviewed and stable  Post vital signs: Reviewed and stable  Complications: No apparent anesthesia complications

## 2011-10-18 NOTE — Anesthesia Postprocedure Evaluation (Signed)
  Anesthesia Post-op Note  Patient: [name]  Procedure(s) Performed: Procedure(s) (LRB): BREAST LUMPECTOMY WITH NEEDLE LOCALIZATION AND AXILLARY SENTINEL LYMPH NODE BX (Left)  Patient Location: PACU  Anesthesia Type: General  Level of Consciousness: awake, alert  and oriented  Airway and Oxygen Therapy: Patient Spontanous Breathing  Post-op Pain: mild  Post-op Assessment: Post-op Vital signs reviewed, Patient's Cardiovascular Status Stable, Respiratory Function Stable, Patent Airway, No signs of Nausea or vomiting and Pain level controlled  Post-op Vital Signs: Reviewed and stable  Complications: No apparent anesthesia complications

## 2011-10-18 NOTE — Anesthesia Procedure Notes (Signed)
Procedure Name: Intubation Performed by: Lance Coon Pre-anesthesia Checklist: Patient identified, Timeout performed, Emergency Drugs available, Suction available and Patient being monitored Patient Re-evaluated:Patient Re-evaluated prior to inductionOxygen Delivery Method: Circle system utilized Preoxygenation: Pre-oxygenation with 100% oxygen Intubation Type: IV induction Ventilation: Mask ventilation without difficulty Laryngoscope Size: Mac and 3 Grade View: Grade I Tube size: 7.0 mm Number of attempts: 1 Airway Equipment and Method: Stylet and LTA kit utilized Placement Confirmation: ETT inserted through vocal cords under direct vision,  breath sounds checked- equal and bilateral and positive ETCO2 Secured at: 22 cm Tube secured with: Tape Dental Injury: Teeth and Oropharynx as per pre-operative assessment

## 2011-10-18 NOTE — Interval H&P Note (Signed)
History and Physical Interval Note:  10/18/2011 9:37 AM  [name]  has presented today for surgery, with the diagnosis of left invasive ductal carcinoma  The various methods of treatment have been discussed with the patient and family. After consideration of risks, benefits and other options for treatment, the patient has consented to  Procedure(s) (LRB): BREAST LUMPECTOMY WITH NEEDLE LOCALIZATION AND AXILLARY SENTINEL LYMPH NODE BX (Left) as a surgical intervention .  The patient's history has been reviewed, patient examined, no change in status, stable for surgery.  I have reviewed the patient's chart and labs.  Questions were answered to the patient's satisfaction.     TOTH III,PAUL S

## 2011-10-18 NOTE — Anesthesia Preprocedure Evaluation (Signed)
Anesthesia Evaluation  Patient identified by MRN, date of birth, ID band Patient awake    Reviewed: Allergy & Precautions, H&P , NPO status , Patient's Chart, lab work & pertinent test results  History of Anesthesia Complications Negative for: history of anesthetic complications  Airway Mallampati: II TM Distance: >3 FB Neck ROM: Full    Dental  (+) Teeth Intact, Caps, Implants and Dental Advisory Given   Pulmonary neg pulmonary ROS,  breath sounds clear to auscultation  Pulmonary exam normal       Cardiovascular hypertension, Pt. on medications - dysrhythmias Rhythm:Regular Rate:Normal     Neuro/Psych negative neurological ROS  negative psych ROS   GI/Hepatic Neg liver ROS, GERD-  Medicated and Poorly Controlled,  Endo/Other  negative endocrine ROS  Renal/GU negative Renal ROS     Musculoskeletal   Abdominal   Peds  Hematology negative hematology ROS (+)   Anesthesia Other Findings Breast cancer  Reproductive/Obstetrics                           Anesthesia Physical Anesthesia Plan  ASA: II  Anesthesia Plan: General   Post-op Pain Management:    Induction: Intravenous  Airway Management Planned: Oral ETT  Additional Equipment:   Intra-op Plan:   Post-operative Plan: Extubation in OR  Informed Consent: I have reviewed the patients History and Physical, chart, labs and discussed the procedure including the risks, benefits and alternatives for the proposed anesthesia with the patient or authorized representative who has indicated his/her understanding and acceptance.   Dental advisory given  Plan Discussed with: CRNA and Surgeon  Anesthesia Plan Comments: (Plan routine monitors, GETA)        Anesthesia Quick Evaluation

## 2011-10-18 NOTE — Progress Notes (Signed)
Post procedure VSS stable tol procedure well.

## 2011-10-18 NOTE — H&P (View-Only) (Signed)
Subjective:     Patient ID: [name], female   DOB: 02/13/1946, 66 y.o.   MRN: [id number]  HPI We are asked to see the patient in consultation by Dr. Reid to evaluate her for a left breast cancer. The patient is a 66-year-old white female who has been having some soreness in the upper outer portion of her left breast off and on for a number of years. She recently went for a routine screening mammogram at which time a 3 mm area of calcification was seen in the upper outer left breast. This was biopsied and came back as invasive ductal cancer of low grade. She denies any discharge from her nipple on either side. She is taking estrogen. She has not had any breast problems in the past. She has not had any family history of breast problems either.  Review of Systems  Constitutional: Negative.   HENT: Negative.   Eyes: Negative.   Respiratory: Negative.   Cardiovascular: Negative.   Gastrointestinal: Negative.   Genitourinary: Negative.   Musculoskeletal: Negative.   Skin: Negative.   Neurological: Negative.   Hematological: Negative.   Psychiatric/Behavioral: Negative.        Objective:   Physical Exam  Constitutional: She is oriented to person, place, and time. She appears well-developed and well-nourished.  HENT:  Head: Normocephalic and atraumatic.  Eyes: Conjunctivae and EOM are normal. Pupils are equal, round, and reactive to light.  Neck: Normal range of motion. Neck supple.  Cardiovascular: Normal rate, regular rhythm and normal heart sounds.   Pulmonary/Chest: Effort normal and breath sounds normal.       There is no significant palpable mass in either breast. There is no palpable axillary or supraclavicular cervical lymphadenopathy.  Abdominal: Soft. Bowel sounds are normal. She exhibits no mass. There is no tenderness.  Musculoskeletal: Normal range of motion.  Lymphadenopathy:    She has no cervical adenopathy.  Neurological: She is alert and oriented to person, place,  and time.  Skin: Skin is warm and dry.  Psychiatric: She has a normal mood and affect. Her behavior is normal.       Assessment:     The patient has what appears to be a very small early stage breast cancer in the upper left breast. I've discussed in detail the options for her treatment and she has elected to have breast conservation and sentinel node mapping. I think this is a good choice. I have discussed her in detail the risks and benefits of the operation to remove the cancer as well as some of the technical aspects and she understands and wishes to proceed.    Plan:     Plan for left breast wire localized lumpectomy and sentinel node mapping      

## 2011-10-18 NOTE — Op Note (Signed)
10/18/2011  11:32 AM  PATIENT:  [name]  66 y.o. female  PRE-OPERATIVE DIAGNOSIS:  left invasive ductal carcinoma  POST-OPERATIVE DIAGNOSIS:  left invasive ductal carcinoma  PROCEDURE:  Procedure(s) (LRB): BREAST LUMPECTOMY WITH NEEDLE LOCALIZATION AND AXILLARY SENTINEL LYMPH NODE BX (Left)  SURGEON:  Surgeon(s) and Role:    * Robyne Askew, MD - Primary  PHYSICIAN ASSISTANT:   ASSISTANTS: none   ANESTHESIA:   general  EBL:  Total I/O In: 1000 [I.V.:1000] Out: -   BLOOD ADMINISTERED:none  DRAINS: none   LOCAL MEDICATIONS USED:  MARCAINE     SPECIMEN:  Source of Specimen:  left breast tissue and sentinel nodes X 2  DISPOSITION OF SPECIMEN:  PATHOLOGY  COUNTS:  YES  TOURNIQUET:  * No tourniquets in log *  DICTATION: .Dragon Dictation After informed consent was obtained the patient was brought to the operating room and placed in the supine position on the operating room table. After adequate induction of general anesthesia the patient's left breast, chest, and axilla were prepped with ChloraPrep, allowed to dry, and draped in usual sterile fashion. Earlier in the day the patient underwent injection of 1 mCi of technetium sulfur colloid in the subareolar position. Also earlier in the day the patient underwent a wire localization procedure and the wire was entering the left breast in the upper outer quadrant and headed medially. At this point, 2 cc of methylene blue and 3 cc of injectable saline were also injected in the subareolar position and the breast was massaged for several minutes. The neoprobe device was used to identify a hot spot in the left axilla. A small transverse incision was made with a 15 blade knife overlying this hot spot. This incision was carried through the skin and subcutaneous tissue sharply with the electrocautery until the axilla was entered. A [place] retractor was deployed. Blunt hemostat dissection was carried out using the neoprobe to direct  the dissection until a hot blue lymph node was identified. This lymph node was excised sharply with the electrocautery. Ex vivo counts on this lymph node were around 800. This was sent as sentinel node #1. A second hot lymph node that was not blue was identified. It was also excised using the electrocautery and then the lymphatics were clamped with hemostats divided and ligated with 3-0 Vicryl ties. Ex vivo counts on this lymph node were around 500. This was sent as sentinel node #2. No other hot blue or palpable lymph nodes were identified in the left axilla. The wound was infiltrated with quarter percent Marcaine. The deep layer the wound was closed with interrupted 3-0 Vicryl stitches. The skin was then closed with interrupted 4 Monocryl subcuticular stitches. Attention was then turned to the left breast. A transversely oriented curvilinear incision was made in the upper outer quadrant overlying the path of the wire. This incision was carried through the skin and subcutaneous tissue sharply with the electrocautery. Once into the breast tissue the path of the wire could be palpated. A circular portion of breast tissue was excised sharply around the path of the wire using the electrocautery. This dissection was carried all the way to the chest wall. Once the specimen was removed it was oriented according to the assigned pain colors. A specimen radiograph was obtained that showed the clip and wire to be in the center of the specimen. The specimen was then sent to pathology for further evaluation. Hemostasis was achieved using the Bovie electrocautery. One was infiltrated with quarter  percent Marcaine. The wound is irrigated with copious amounts saline. The deep layer the wound was then closed with interrupted 3-0 Vicryl stitches. The skin was then closed with interrupted 4 Mako subcuticular stitches. Dermabond dressings were applied. The patient tolerated the procedure well. At the end of the case on needle sponge  and instrument counts were correct. The patient was then awakened and taken to recovery in stable condition.  PLAN OF CARE: Discharge to home after PACU  PATIENT DISPOSITION:  PACU - hemodynamically stable.   Delay start of Pharmacological VTE agent (>24hrs) due to surgical blood loss or risk of bleeding: not applicable

## 2011-10-22 ENCOUNTER — Telehealth: Payer: Self-pay | Admitting: *Deleted

## 2011-10-22 NOTE — Telephone Encounter (Signed)
Spoke to pt concerning ABC for PT.  Informed pt she will need clearance from Dr. Carolynne Edouard.  Pt also request to switch to Dr. Michell Heinrich for radiation from Dr. Mitzi Hansen.  Pt relate she really liked Dr. Mitzi Hansen, but she would feel more comfortable with a female doctor.  I called Clydie Braun in rad onc with pt request.  Pt to see Dr. Michell Heinrich on 8/21 at 2:00.  Gave pt date and time.  Also referred pt to R2R.  Pt denies further needs or concerns.  Encourage pt to with further questions.  Contact information given.

## 2011-10-28 ENCOUNTER — Telehealth (INDEPENDENT_AMBULATORY_CARE_PROVIDER_SITE_OTHER): Payer: Self-pay | Admitting: General Surgery

## 2011-10-28 NOTE — Telephone Encounter (Signed)
Spoke with pt and let her know that her nodes looked negative and that all the margins were clean.

## 2011-10-28 NOTE — Telephone Encounter (Signed)
Message copied by Littie Deeds on Mon Oct 28, 2011  3:10 PM ------      Message from: Caleen Essex III      Created: Mon Oct 28, 2011  2:23 PM       Nodes are neg and margins are clean

## 2011-10-31 ENCOUNTER — Telehealth (INDEPENDENT_AMBULATORY_CARE_PROVIDER_SITE_OTHER): Payer: Self-pay | Admitting: General Surgery

## 2011-10-31 NOTE — Telephone Encounter (Signed)
Pt called to ask if okay to rub lotion into her healing wound.  She has been standing in the shower to keep it clean.  Advised her to hold off on applying anything to the surgical site until seen next week by her surgeon.  She understands and will wait.

## 2011-11-05 ENCOUNTER — Encounter (INDEPENDENT_AMBULATORY_CARE_PROVIDER_SITE_OTHER): Payer: Self-pay | Admitting: General Surgery

## 2011-11-05 ENCOUNTER — Ambulatory Visit (INDEPENDENT_AMBULATORY_CARE_PROVIDER_SITE_OTHER): Payer: BC Managed Care – PPO | Admitting: General Surgery

## 2011-11-05 ENCOUNTER — Encounter: Payer: Self-pay | Admitting: Oncology

## 2011-11-05 ENCOUNTER — Telehealth: Payer: Self-pay | Admitting: *Deleted

## 2011-11-05 ENCOUNTER — Ambulatory Visit (HOSPITAL_BASED_OUTPATIENT_CLINIC_OR_DEPARTMENT_OTHER): Payer: BC Managed Care – PPO | Admitting: Oncology

## 2011-11-05 VITALS — BP 138/72 | HR 76 | Temp 97.8°F | Ht 64.0 in | Wt 134.4 lb

## 2011-11-05 VITALS — BP 131/73 | HR 83 | Temp 98.9°F | Resp 20 | Ht 64.0 in | Wt 135.9 lb

## 2011-11-05 DIAGNOSIS — C50419 Malignant neoplasm of upper-outer quadrant of unspecified female breast: Secondary | ICD-10-CM

## 2011-11-05 DIAGNOSIS — Z170 Estrogen receptor positive status [ER+]: Secondary | ICD-10-CM

## 2011-11-05 MED ORDER — CEPHALEXIN 500 MG PO CAPS: 500.0000 mg | ORAL_CAPSULE | Freq: Four times a day (QID) | ORAL | Status: AC

## 2011-11-05 NOTE — Telephone Encounter (Signed)
Faxed over referral to [name] gave patient appointment for 02-06-2012 starting at 9:30am printed out calendar and gave to the patient

## 2011-11-05 NOTE — Patient Instructions (Addendum)
Proceed with radiation therapy first  Refer to Dwaine Gale  I will see you back in 3 months

## 2011-11-05 NOTE — Progress Notes (Signed)
OFFICE PROGRESS NOTE  CC  [place], [place] [address] [place] [address] Dr. [name]  DIAGNOSIS: Invasive ductal carcinoma of the left breast (T1aN0) stage I  PRIOR THERAPY: 1. S/p Lumpectomy with sentinel node biopsy with the final showing an ER+ T1aN0 invasive ductal carcinoma with DCIS  2. Radiation therapy post lumpectomy to begin in the next few weeks  CURRENT THERAPY:proceed with radiation therapy  INTERVAL HISTORY: [name] 66 y.o. female returns for follow up visit after her lumpectomy. Post op she is doing well. She has no complaints except for pain. She was seen by Dr. Michell Heinrich for RT.  MEDICAL HISTORY: Past Medical History  Diagnosis Date  . IBS (irritable bowel syndrome)   . Arthritis   . Breast cancer   . GERD (gastroesophageal reflux disease)   . H/O colonoscopy   . H/O bone density study   . Night sweats   . Sinus complaint   . Heartburn   . Psoriasis   . Anxiety   . Depression   . Hot flashes   . Dysrhythmia     palpitations  . Hypertension     ALLERGIES:   has no known allergies.  MEDICATIONS:  Current Outpatient Prescriptions  Medication Sig Dispense Refill  . BIOTIN PO Take 550 mg by mouth.      . calcium carbonate 200 MG capsule Take 250 mg by mouth 2 (two) times daily with a meal.      . co-enzyme Q-10 30 MG capsule Take 300 mg by mouth daily.      . diclofenac sodium (VOLTAREN) 1 % GEL Apply 1 application topically.      Marland Kitchen ezetimibe (ZETIA) 10 MG tablet Take 10 mg by mouth daily.      Marland Kitchen gabapentin (NEURONTIN) 100 MG capsule Take 100 mg by mouth as needed.      Marland Kitchen ibuprofen (ADVIL,MOTRIN) 200 MG tablet Take 200 mg by mouth every 6 (six) hours as needed.      Marland Kitchen Linaclotide (LINZESS) 145 MCG CAPS Take by mouth daily.      . Multiple Vitamin (MULTIVITAMIN) tablet Take 1 tablet by mouth daily.      . Omega-3 Fatty Acids (FISH OIL PO) Take 1 each by mouth.      Marland Kitchen omeprazole (PRILOSEC) 20 MG capsule Take 20 mg by mouth  daily.      . quinapril-hydrochlorothiazide (ACCURETIC) 10-12.5 MG per tablet Take 1 tablet by mouth daily.      . rifaximin (XIFAXAN) 550 MG TABS Take 550 mg by mouth.      . tretinoin (RETIN-A) 0.05 % cream Apply topically at bedtime.      Marland Kitchen venlafaxine XR (EFFEXOR-XR) 37.5 MG 24 hr capsule Take 1 capsule (37.5 mg total) by mouth daily.  30 capsule  6    SURGICAL HISTORY:  Past Surgical History  Procedure Date  . Cataract extraction   . Tonsillectomy and adenoidectomy   . Dilation and curettage of uterus   . Abdominal hysterectomy   . Hand surgery     RSD  . Colonoscopy   . Upper gastrointestinal endoscopy   . Breast surgery 10/18/2011    LT br lumpectomy    REVIEW OF SYSTEMS:  A comprehensive review of systems was negative.   PHYSICAL EXAMINATION: General appearance: alert, cooperative and appears stated age Lymph nodes: Cervical, supraclavicular, and axillary nodes normal. Resp: clear to auscultation bilaterally Cardio: regular rate and rhythm GI: soft, non-tender; bowel sounds normal; no masses,  no organomegaly Extremities: extremities normal, atraumatic, no cyanosis or edema Neurologic: Grossly normal Left breat incision healing well  ECOG PERFORMANCE STATUS: 0 - Asymptomatic  Blood pressure 131/73, pulse 83, temperature 98.9 F (37.2 C), temperature source Oral, resp. rate 20, height 5\' 4"  (1.626 m), weight 135 lb 14.4 oz (61.644 kg).  LABORATORY DATA: Lab Results  Component Value Date   WBC 6.7 10/02/2011   HGB 13.0 10/18/2011   HCT 40.4 10/02/2011   MCV 94.1 10/02/2011   PLT 293 10/02/2011      Chemistry      Component Value Date/Time   NA 137 10/17/2011 1502   K 3.2* 10/17/2011 1502   CL 98 10/17/2011 1502   CO2 29 10/17/2011 1502   BUN 11 10/17/2011 1502   CREATININE 0.73 10/17/2011 1502      Component Value Date/Time   CALCIUM 9.2 10/17/2011 1502   ALKPHOS 73 10/02/2011 1603   AST 15 10/02/2011 1603   ALT 10 10/02/2011 1603   BILITOT 0.2* 10/02/2011 1603      ADDITIONAL INFORMATION: PROGNOSTIC INDICATORS - ACIS Results There is insufficient carcinoma present for analysis. All controls stained appropriately Pecola Leisure MD Pathologist, Electronic Signature ( Signed 10/02/2011) CHROMOGENIC IN-SITU HYBRIDIZATION Interpretation: There is insufficient carcinoma present for Her2 analysis. Reference range: Ratio: HER2:CEP17 < 1.8 - gene amplification not observed Ratio: HER2:CEP 17 1.8-2.2 - equivocal result Ratio: HER2:CEP17 > 2.2 - gene amplification observed Pecola Leisure MD Pathologist, Electronic Signature ( Signed 10/01/2011) FINAL DIAGNOSIS 1 of 3 FINAL for [name], [name] ([id number]) Diagnosis Breast, left, needle core biopsy, UOQ - INVASIVE DUCTAL CARCINOMA WITH MUCINOUS FEATURES. - ATYPICAL HYPERPLASIA WITH ASSOCIATED CALCIFICATION. - FIBROCYSTIC CHANGES WITH ASSOCIATED CALCIFICATION. - SEE COMMENT. Microscopic Comment Although definitive grading of breast carcinoma is best done on excision, the features of the tumor from the left upper outer quadrant needle biopsy are compatible with a grade I breast carcinoma with mucinous features. Very little tumor is present on histologic examination. A breast prognostic profile will be attempted, although, the paucity of tumor may be quantitatively insufficient. The findings are called to [facility] on 09/26/11. Both Dr. Colonel Bald and Dr. Frederica Kuster have seen this case in consultation with agreement that there is invasive ductal carcinoma present. (RAH:gt, 09/26/11) Zandra Abts MD Pathologist, Electronic Signature (Case signed 09/26/2011) Specimen G  ADDITIONAL INFORMATION: 3. PROGNOSTIC INDICATORS - ACIS Results IMMUNOHISTOCHEMICAL AND MORPHOMETRIC ANALYSIS BY THE AUTOMATED CELLULAR IMAGING SYSTEM (ACIS) Estrogen Receptor (Negative, <1%): 99%, STRONG STAINING INTENSITY Progesterone Receptor (Negative, <1%): 100%, STRONG STAINING INTENSIT Y All controls stained  appropriately Pecola Leisure MD Pathologist, Electronic Signature ( Signed 10/23/2011) FINAL DIAGNOSIS Diagnosis 1. Lymph node, sentinel, biopsy, Left #1 - THERE IS NO EVIDENCE OF CARCINOMA IN 1 OF 1 LYMPH NODE (0/1). 2. Lymph node, sentinel, biopsy, Left #2 - THERE IS NO EVIDENCE OF CARCINOMA IN 1 OF 1 LYMPH NODE (0/1). 3. Breast, lumpectomy, Left - DUCTAL CARCINOMA IN SITU WITH ASSOCIATED MUCIN, LOW GRADE, SPANNING 1.2 CM. - THE SURGICAL RESECTION MARGINS ARE NEGATIVE FOR DUCTAL CARCINOMA. - SEE ONCOLOGY TABLE BELOW. Microscopic Comment 3. BREAST, INVASIVE TUMOR, WITH LYMPH NODE SAMPLING (Including data from patient's previous core biopsy, [id number]) 1 of 3 FINAL for Twombly, Brendolyn ([id number]) Microscopic Comment(continued) Specimen, including laterality: Left breast Procedure: Needle localized lumpectomy Grade: I Tubule formation: 1 Nuclear pleomorphism: 1 Mitotic: 1 Tumor size (glass slide measurement): less than 0.1 cm Margins: Negative for carcinoma Invasive, distance to closest margin: Greater than 0.2 cm to all  margins In-situ, distance to closest margin: Greater than 0.2 cm to all margins Lymphovascular invasion: Not identified Ductal carcinoma in situ: Present Grade: Low grade Extensive intraductal component: Yes Lobular neoplasia: Not identified Tumor focality: Unifocal Treatment effect: N/A Extent of tumor: Confined to breast parenchyma Lymph nodes: # examined: 2 Lymph nodes with metastasis: 0 Breast prognostic profile: Estrogen and progesterone receptor studies will be performed on the current case and the results reported separately. Non-neoplastic breast: Healing biopsy site TNM: pT79mi, pN0 Comments: The current specimen contains low grade ductal carcinoma in situ associated with extracellular mucin. In addition, there are scattered acellular pools of mucin. However, no carcinoma cell are identified within these pools The size and grade of the invasive  component are based on the patient's previous core biopsy. Of note, the entire localized area of the current specimen has been submitted for histologic evaluation. (JBK:kh 10-21-11)  RADIOGRAPHIC STUDIES:  Nm Sentinel Node Inj-no Rpt (breast)  10/18/2011  CLINICAL DATA: left breast cancer   Sulfur colloid was injected intradermally by the nuclear medicine  technologist for breast cancer sentinel node localization.     Mm Breast Surgical Specimen  10/18/2011  *RADIOLOGY REPORT*  Clinical Data:  Mucinous carcinoma, left breast.  NEEDLE LOCALIZATION WITH MAMMOGRAPHIC GUIDANCE AND SPECIMEN RADIOGRAPH  Comparison:  Previous exams  Patient presents for needle localization prior to left lumpectomy. I met with the patient and we discussed the procedure of needle localization including benefits and alternatives. We discussed the high likelihood of a successful procedure. We discussed the risks of the procedure, including infection, bleeding, tissue injury, and further surgery. Informed, written consent was given.  Using mammographic guidance, sterile technique, 2% lidocaine and a 7 cm modified Kopans needle, the clip localized using a lateral approach.  The films are marked for Dr. Carolynne Edouard.  Specimen radiograph was performed at [facility], and confirms the clip, residual calcifications and wire are present in the tissue sample.  The specimen is marked for pathology.  IMPRESSION: Needle localization left breast.  No apparent complications.  Original Report Authenticated By: Hiram Gash, M.D.   Mm Breast Wire Localization Left  10/18/2011  *RADIOLOGY REPORT*  Clinical Data:  Mucinous carcinoma, left breast.  NEEDLE LOCALIZATION WITH MAMMOGRAPHIC GUIDANCE AND SPECIMEN RADIOGRAPH  Comparison:  Previous exams  Patient presents for needle localization prior to left lumpectomy. I met with the patient and we discussed the procedure of needle localization including benefits and alternatives. We discussed the high  likelihood of a successful procedure. We discussed the risks of the procedure, including infection, bleeding, tissue injury, and further surgery. Informed, written consent was given.  Using mammographic guidance, sterile technique, 2% lidocaine and a 7 cm modified Kopans needle, the clip localized using a lateral approach.  The films are marked for Dr. Carolynne Edouard.  Specimen radiograph was performed at [facility], and confirms the clip, residual calcifications and wire are present in the tissue sample.  The specimen is marked for pathology.  IMPRESSION: Needle localization left breast.  No apparent complications.  Original Report Authenticated By: Hiram Gash, M.D.    ASSESSMENT: 67 year old female with invasive ductal carcinoma of the left breast s/p left lumpectomy. The final path showed minimla focus of invasion with the remaining area primarily DCIS, ER+ PR+.   PLAN: proceed with radiation first. The we will plan anti-estorgen therapy with Tamoxifen or an AI.   All questions were answered. The patient knows to call the clinic with any problems, questions or concerns. We  can certainly see the patient much sooner if necessary.  I spent 20 minutes counseling the patient face to face. The total time spent in the appointment was 30 minutes.    Drue Second, MD Medical/Oncology [facility] [phone] (beeper) [phone] (Office)  11/05/2011, 1:51 PM

## 2011-11-05 NOTE — Patient Instructions (Signed)
Physical therapy class Continue regular self exams

## 2011-11-06 ENCOUNTER — Telehealth (INDEPENDENT_AMBULATORY_CARE_PROVIDER_SITE_OTHER): Payer: Self-pay | Admitting: General Surgery

## 2011-11-06 ENCOUNTER — Ambulatory Visit
Admission: RE | Admit: 2011-11-06 | Discharge: 2011-11-06 | Disposition: A | Discharge status: Home / Self Care | Payer: BC Managed Care – PPO | Source: Ambulatory Visit | Attending: Radiation Oncology | Admitting: Radiation Oncology

## 2011-11-06 ENCOUNTER — Encounter: Payer: Self-pay | Admitting: Radiation Oncology

## 2011-11-06 VITALS — BP 130/75 | HR 85 | Temp 97.4°F | Resp 18 | Ht 64.0 in | Wt 135.2 lb

## 2011-11-06 DIAGNOSIS — C50419 Malignant neoplasm of upper-outer quadrant of unspecified female breast: Secondary | ICD-10-CM | POA: Insufficient documentation

## 2011-11-06 DIAGNOSIS — Z79899 Other long term (current) drug therapy: Secondary | ICD-10-CM | POA: Insufficient documentation

## 2011-11-06 DIAGNOSIS — Z170 Estrogen receptor positive status [ER+]: Secondary | ICD-10-CM | POA: Insufficient documentation

## 2011-11-06 DIAGNOSIS — L259 Unspecified contact dermatitis, unspecified cause: Secondary | ICD-10-CM | POA: Insufficient documentation

## 2011-11-06 DIAGNOSIS — Z510 Encounter for antineoplastic radiation therapy: Secondary | ICD-10-CM | POA: Insufficient documentation

## 2011-11-06 NOTE — Telephone Encounter (Signed)
Pt wanted to update Dr. Carolynne Edouard:  Dr. Welton Flakes has started her on Cephalexin 500 mg QID for increased inflammation at surgical site.

## 2011-11-06 NOTE — Progress Notes (Signed)
Subjective:     Patient ID: [name], female   DOB: 13-Feb-1946, 66 y.o.   MRN: [id number]  HPI The pt is a 66 yo wf who is about 2 weeks out from a left breast lumpectomy and negative sentinel node mapping. She has done well and only complains of some soreness and redness of the left breast.  Review of Systems     Objective:   Physical Exam On exam her left breast and axillary incisions are healing nicely. She does have some redness centered around the nipple and areola which i believe is a reaction to the nuclear tracer and not an infection    Assessment:     2 weeks s/p left lumpectomy and sentinel node mapping    Plan:     At this point she is doing well. i would hold off on abx for now unless the redness worsens. Will plan to see her back in a couple weeks

## 2011-11-06 NOTE — Progress Notes (Signed)
66 year old female. Post menopausal.   09/10/2011 mammogram revealed abnormality. Pathology from biopsy showed invasive ductal carcinoma with mucinous features atypical ductal hyperplasia with calcifications and fibrocystic changes with calcification. 1.4 cm post biopsy hematoma occurred. 10/18/2011 left breast lumpectomy with needle localization and axillary sentinel lymph node biopsy. Pathology from lumpectomy confirmed ductal carcinoma in situ node negative er, pr positive  NKDA No hx of radiation therapy  No indication of a pacemaker

## 2011-11-06 NOTE — Progress Notes (Signed)
See progress noted under physician encounter.  

## 2011-11-06 NOTE — Progress Notes (Signed)
Patient presented to the clinic today accompanied by her husband for a consultation with Dr. Michell Cameron to discuss the role of radiation therapy in the treatment of breast ca. Patient is alert and oriented to person, place, and time. No distress noted. Steady gait noted. Pleasant affect noted. Patient denies pain at this time. However, patient reports an occasional sharp shooting brief pain in affected breast but, understands this is related to healing from surgery. Patient reports that it feels like her left nipple is inverted and pulling; she verbalized Dr. Carolynne Cameron is aware. Also, patient reports her left breast is warm to the touch. Patient denies nipple discharge. Patient denies nausea,vomiting, headache or dizziness. Reported all findings to Dr. Michell Cameron.       Follow up with [name] in three months Scheduled to se Dr. Park Breed 02/06/2012.

## 2011-11-06 NOTE — Progress Notes (Signed)
Submitted complete PATIENT MEASURE OF DISTRESS worksheet with a score of 7 submitted to social work. Patient denies need to see social worker today.

## 2011-11-07 NOTE — Progress Notes (Signed)
Department of Radiation Oncology  Phone:  [phone] Fax:        [phone]   Name: [name]   DOB: [birth year]  MRN: [id number]    Date: 11/07/2011  Follow Up Visit Note  Diagnosis: T1aN0 Invasive Ductal Carcinoma of the left breast  Interval History: [name] presents today for routine followup.  She was originally seen and multidisciplinary clinic by Dr. Mitzi Cameron and I had treated several of her friends suggest retransferred my service. She underwent a lumpectomy on 10/18/2011 which showed only ductal carcinoma in situ. This was ER and PR positive. 2 lymph nodes were negative for metastatic disease. The DCIS spanning 1.2 cm. Her original biopsy had a focus of invasive carcinoma. She has been seen by Dr. Carolynne Cameron and is on antibiotics for some redness around her nipple related to her sentinel lymph node injections. She has some fatigue and feels as though she is having a difficult.time getting restarted after surgery. She husband have a trip planned with some friends to [place] the last week of September and she wondered if she could start treatment the first week of October. She has met with Dr. Welton Cameron has appointment to see her in November and an appointment to see Dr. Carolynne Cameron in November as well. She has an occasional sharp shooting pain in her left breast and feels that her left nipple is inverted but other than that really has no complaints today. She is accompanied by her husband.  Allergies: No Known Allergies  Medications:  Current Outpatient Prescriptions  Medication Sig Dispense Refill  . BIOTIN PO Take 550 mg by mouth.      . calcium carbonate 200 MG capsule Take 250 mg by mouth 2 (two) times daily with a meal.      . cephALEXin (KEFLEX) 500 MG capsule Take 1 capsule (500 mg total) by mouth 4 (four) times daily.  40 capsule  0  . co-enzyme Q-10 30 MG capsule Take 300 mg by mouth daily.      . diclofenac sodium (VOLTAREN) 1 % GEL Apply 1 application topically.      Marland Kitchen  ezetimibe (ZETIA) 10 MG tablet Take 10 mg by mouth daily.      Marland Kitchen gabapentin (NEURONTIN) 100 MG capsule Take 100 mg by mouth as needed.      Marland Kitchen ibuprofen (ADVIL,MOTRIN) 200 MG tablet Take 200 mg by mouth every 6 (six) hours as needed.      Marland Kitchen Linaclotide (LINZESS) 145 MCG CAPS Take by mouth daily.      . Multiple Vitamin (MULTIVITAMIN) tablet Take 1 tablet by mouth daily.      . Omega-3 Fatty Acids (FISH OIL PO) Take 1 each by mouth.      Marland Kitchen omeprazole (PRILOSEC) 20 MG capsule Take 20 mg by mouth daily.      . quinapril-hydrochlorothiazide (ACCURETIC) 10-12.5 MG per tablet Take 1 tablet by mouth daily.      . rifaximin (XIFAXAN) 550 MG TABS Take 550 mg by mouth.      . tretinoin (RETIN-A) 0.05 % cream Apply topically at bedtime.      Marland Kitchen venlafaxine XR (EFFEXOR-XR) 37.5 MG 24 hr capsule Take 1 capsule (37.5 mg total) by mouth daily.  30 capsule  6    Physical Exam:   height is 5\' 4"  (1.626 m) and weight is 135 lb 3.2 oz (61.326 kg). Her oral temperature is 97.4 F (36.3 C). Her blood pressure is 130/75 and her pulse is 85. Her respiration is  18 and oxygen saturation is 100%.  She is a pleasant female in no distress sitting comfortably examining table. She has some redness around her left nipple. There is some swelling here or all which looks normal postsurgical changes. There is no evidence of infection. She really has a great cosmetic result.  IMPRESSION: [name] is a 66 y.o. female with a minimally invasive T1 A. N0 invasive ductal carcinoma left breast status post lumpectomy  PLAN:  [name] looks great and is healing well from surgery. This trip was very important to them and I think it is fine for them to go ahead and travel. We will schedule her for simulation right before she leaves and will have some time to plan will she is on vacation. We discussed the process of simulation. We discussed the use of breath hold for cardiac sparing. We discussed the placement of tattoos for daily localization. We  discussed possible side effects of treatment including but not limited to skin redness , breast swelling, lung rib and heart damage. We discussed the possibility of secondary malignancies. She has signed informed consent and agree to proceed forward.    Catherine Hare, MD

## 2011-11-08 NOTE — Addendum Note (Signed)
Encounter addended by: Delynn Flavin, RN on: 11/08/2011  7:03 PM<BR>     Documentation filed: Charges VN

## 2011-11-08 NOTE — Addendum Note (Signed)
Encounter addended by: Draya Felker Mintz Jahron Hunsinger, RN on: 11/08/2011  7:07 PM<BR>     Documentation filed: Charges VN

## 2011-11-14 ENCOUNTER — Telehealth (INDEPENDENT_AMBULATORY_CARE_PROVIDER_SITE_OTHER): Payer: Self-pay

## 2011-11-14 NOTE — Telephone Encounter (Signed)
The patient called regarding her redness around the nipple from the nuc med injection.  She is on Cephalexin and it's getting better.  She wants to know how long to give it.  I spoke to Dr Carolynne Edouard and he advised it will take several months to clear up.  I advised the pt.

## 2011-11-26 ENCOUNTER — Ambulatory Visit: Payer: BC Managed Care – PPO | Attending: Oncology | Admitting: Physical Therapy

## 2011-11-26 DIAGNOSIS — M25519 Pain in unspecified shoulder: Secondary | ICD-10-CM | POA: Insufficient documentation

## 2011-11-26 DIAGNOSIS — IMO0001 Reserved for inherently not codable concepts without codable children: Secondary | ICD-10-CM | POA: Insufficient documentation

## 2011-11-26 DIAGNOSIS — C50919 Malignant neoplasm of unspecified site of unspecified female breast: Secondary | ICD-10-CM | POA: Insufficient documentation

## 2011-11-26 DIAGNOSIS — M25619 Stiffness of unspecified shoulder, not elsewhere classified: Secondary | ICD-10-CM | POA: Insufficient documentation

## 2011-11-28 ENCOUNTER — Ambulatory Visit: Payer: BC Managed Care – PPO | Admitting: Physical Therapy

## 2011-12-04 ENCOUNTER — Ambulatory Visit: Payer: BC Managed Care – PPO

## 2011-12-09 ENCOUNTER — Ambulatory Visit: Payer: BC Managed Care – PPO

## 2011-12-10 ENCOUNTER — Ambulatory Visit
Admission: RE | Admit: 2011-12-10 | Discharge: 2011-12-10 | Disposition: A | Discharge status: Home / Self Care | Payer: BC Managed Care – PPO | Source: Ambulatory Visit | Attending: Radiation Oncology | Admitting: Radiation Oncology

## 2011-12-10 DIAGNOSIS — C50419 Malignant neoplasm of upper-outer quadrant of unspecified female breast: Secondary | ICD-10-CM

## 2011-12-10 NOTE — Progress Notes (Signed)
Name: [name]   MRN: [id number]  Date:  12/10/2011  DOB: 1945/10/21  Status:outpatient    DIAGNOSIS: Breast cancer.  CONSENT VERIFIED: yes   SET UP: Patient is setup supine   IMMOBILIZATION:  The following immobilization was used:Custom Moldable Pillow, breast board.   NARRATIVE: [name] was brought to the CT Simulation planning suite.  Identity was confirmed.  All relevant records and images related to the planned course of therapy were reviewed.  Then, the patient was positioned in a stable reproducible clinical set-up for radiation therapy.  Wires were placed to delineate the clinical extent of breast tissue. A wire was placed on the scar as well.  CT images were obtained.  An isocenter was placed. I verfied the location of her heart outside the tangent fields. I did not feel she would be aided by breath hold. Skin markings were placed.  The CT images were loaded into the planning software where the target and avoidance structures were contoured.  The radiation prescription was entered and confirmed. The patient was discharged in stable condition and tolerated simulation well.    TREATMENT PLANNING NOTE:  Treatment planning then occurred. I have requested : MLC's, isodose plan, basic dose calculation  3D simulation was performed with [facility] requested of heart, lungs and tumor cavity.   3 complex treatment devices were formed today

## 2011-12-17 ENCOUNTER — Encounter: Payer: Self-pay | Admitting: Radiation Oncology

## 2011-12-19 ENCOUNTER — Telehealth: Payer: Self-pay | Admitting: Emergency Medicine

## 2011-12-19 NOTE — Telephone Encounter (Signed)
Patient left voicemail asking if labs had been collected or ordered for a thyroid panel.  Patient also inquiring if she can receive a flu shot.  Patient also had a question concerning cream to apply on her nipple prior to radiation.   Samantha with Dr Michell Heinrich to call patient regarding concerns with radiation.

## 2011-12-19 NOTE — Telephone Encounter (Signed)
We did not draw thyroid panels  She can have the flu shot

## 2011-12-20 ENCOUNTER — Telehealth: Payer: Self-pay | Admitting: Radiation Oncology

## 2011-12-20 NOTE — Telephone Encounter (Signed)
Returned patient call. Patient called to question appropriate lotion to use on breast while under treatment. Informed patient that a post sim education session would be scheduled for a time following her initial treatment where lotion would be provided and she would be educated reference potential side effects. Patient reports that she was given a cream by her dermatologist but, wants to know if it would be OK to apply to her breast. Encouraged patient to bring said cream to appointment on Tuesday so that Dr. Michell Heinrich could advise. Patient verbalized understanding. Encouraged patient to contact staff with future needs. Routed message to Dr. Michell Heinrich.

## 2011-12-23 ENCOUNTER — Ambulatory Visit
Admission: RE | Admit: 2011-12-23 | Discharge: 2011-12-23 | Disposition: A | Discharge status: Home / Self Care | Payer: BC Managed Care – PPO | Source: Ambulatory Visit | Attending: Radiation Oncology | Admitting: Radiation Oncology

## 2011-12-23 ENCOUNTER — Encounter: Payer: Self-pay | Admitting: Radiation Oncology

## 2011-12-23 ENCOUNTER — Other Ambulatory Visit: Payer: Self-pay | Admitting: Emergency Medicine

## 2011-12-24 ENCOUNTER — Encounter: Payer: Self-pay | Admitting: Radiation Oncology

## 2011-12-24 ENCOUNTER — Ambulatory Visit
Admission: RE | Admit: 2011-12-24 | Discharge: 2011-12-24 | Disposition: A | Discharge status: Home / Self Care | Payer: BC Managed Care – PPO | Source: Ambulatory Visit | Attending: Radiation Oncology | Admitting: Radiation Oncology

## 2011-12-24 ENCOUNTER — Telehealth: Payer: Self-pay | Admitting: Oncology

## 2011-12-24 VITALS — BP 151/70 | HR 79 | Resp 18 | Wt 139.0 lb

## 2011-12-24 DIAGNOSIS — C50419 Malignant neoplasm of upper-outer quadrant of unspecified female breast: Secondary | ICD-10-CM

## 2011-12-24 NOTE — Telephone Encounter (Signed)
S/w the pt and she is aware of the nutrition appt in oct

## 2011-12-24 NOTE — Progress Notes (Signed)
Received patient in the clinic today following initial treatment. Patient is alert and oriented to person, place, and time. No distress noted. Steady gait noted. Pleasant affect noted. Patient denies pain at this time. Blood pressure elevated. Patient reports taking bp medication as directed. Patient does report that she has been very stressed today and fearful of the unknowns of what to expect with treatment. Normalized feelings and attempted to provide comfort. Patient has no complaints at this time. Reported all findings to Dr. Michell Heinrich.

## 2011-12-24 NOTE — Progress Notes (Signed)
Weekly Management Note Current Dose: 2.67  Gy  Projected Dose: 52.72 Gy   Narrative:  The patient presents for routine under treatment assessment.  CBCT/MVCT images/Port film x-rays were reviewed.  The chart was checked. Hot flashes are severely bothersome. Was anxious about treatment today but feels better afterwards. Had some lotions given by her dermatologist for her nipples. Wonders if she can use them during treatment.   Physical Findings: Weight: 139 lb (63.05 kg). Unchanged  Impression:  The patient is tolerating radiation.  Plan:  Continue treatment as planned. RN education performed. Hold off on lotion until after treatment. Referred for sheet study re: hot flashes.

## 2011-12-25 ENCOUNTER — Ambulatory Visit
Admission: RE | Admit: 2011-12-25 | Discharge: 2011-12-25 | Disposition: A | Discharge status: Home / Self Care | Payer: BC Managed Care – PPO | Source: Ambulatory Visit | Attending: Radiation Oncology | Admitting: Radiation Oncology

## 2011-12-26 ENCOUNTER — Ambulatory Visit
Admission: RE | Admit: 2011-12-26 | Discharge: 2011-12-26 | Disposition: A | Discharge status: Home / Self Care | Payer: BC Managed Care – PPO | Source: Ambulatory Visit | Attending: Radiation Oncology | Admitting: Radiation Oncology

## 2011-12-26 ENCOUNTER — Ambulatory Visit: Payer: BC Managed Care – PPO | Attending: Oncology

## 2011-12-26 DIAGNOSIS — M25519 Pain in unspecified shoulder: Secondary | ICD-10-CM | POA: Insufficient documentation

## 2011-12-26 DIAGNOSIS — M25619 Stiffness of unspecified shoulder, not elsewhere classified: Secondary | ICD-10-CM | POA: Insufficient documentation

## 2011-12-26 DIAGNOSIS — IMO0001 Reserved for inherently not codable concepts without codable children: Secondary | ICD-10-CM | POA: Insufficient documentation

## 2011-12-26 DIAGNOSIS — C50919 Malignant neoplasm of unspecified site of unspecified female breast: Secondary | ICD-10-CM | POA: Insufficient documentation

## 2011-12-27 ENCOUNTER — Telehealth: Payer: Self-pay | Admitting: Emergency Medicine

## 2011-12-27 ENCOUNTER — Ambulatory Visit
Admission: RE | Admit: 2011-12-27 | Discharge: 2011-12-27 | Disposition: A | Discharge status: Home / Self Care | Payer: BC Managed Care – PPO | Source: Ambulatory Visit | Attending: Radiation Oncology | Admitting: Radiation Oncology

## 2011-12-27 NOTE — Telephone Encounter (Signed)
Patient calling to request that her Effexor dose be increased now that she has started radiation.  Message forwarded to MD for advise.

## 2011-12-27 NOTE — Telephone Encounter (Signed)
Ok to increase the dose 75 mg daily

## 2011-12-30 ENCOUNTER — Ambulatory Visit
Admission: RE | Admit: 2011-12-30 | Discharge: 2011-12-30 | Disposition: A | Discharge status: Home / Self Care | Payer: BC Managed Care – PPO | Source: Ambulatory Visit | Attending: Radiation Oncology | Admitting: Radiation Oncology

## 2011-12-30 MED ORDER — ALRA NON-METALLIC DEODORANT (RAD-ONC)
1.0000 "application " | Freq: Once | TOPICAL | Status: AC
  Administered 2011-12-30: 1 via TOPICAL

## 2011-12-30 MED ORDER — RADIAPLEXRX EX GEL
Freq: Once | CUTANEOUS | Status: AC
  Administered 2011-12-30: 13:00:00 via TOPICAL

## 2011-12-30 NOTE — Progress Notes (Signed)
Late entry from 12/24/2011 at 1530. Patient presented to the clinic today unaccompanied for post sim education with Sam, Charity fundraiser. Patient is alert and oriented to person, place, and time. No distress noted. Steady gait noted. Pleasant affect noted. Patient denies pain at this time. Oriented patient to staff and routine of the clinic. Provided patient with RADIATION THERAPY AND YOU handbook then, reviewed pertinent information. Provided patient with Radiaplex and Alra then, educated upon use. Educated patient on potential side effects and management such as, skin changes and fatigue. 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 Encouraged patient to call with needs. Patient verbalized understanding of all things reviewed.

## 2011-12-31 ENCOUNTER — Encounter: Payer: Self-pay | Admitting: Radiation Oncology

## 2011-12-31 ENCOUNTER — Ambulatory Visit
Admission: RE | Admit: 2011-12-31 | Discharge: 2011-12-31 | Disposition: A | Discharge status: Home / Self Care | Payer: BC Managed Care – PPO | Source: Ambulatory Visit | Attending: Radiation Oncology | Admitting: Radiation Oncology

## 2011-12-31 VITALS — BP 129/73 | HR 88 | Resp 18 | Wt 136.0 lb

## 2011-12-31 DIAGNOSIS — C50419 Malignant neoplasm of upper-outer quadrant of unspecified female breast: Secondary | ICD-10-CM

## 2011-12-31 NOTE — Progress Notes (Signed)
Weekly Management Note Current Dose:  16.02 Gy  Projected Dose: 52.72 Gy   Narrative:  The patient presents for routine under treatment assessment.  CBCT/MVCT images/Port film x-rays were reviewed.  The chart was checked. Doing well except hot flashes.  Would like to double effexor. Asked about Lindi skin care products.   Physical Findings: Weight: 136 lb (61.689 kg). Unchanged skin.   Impression:  The patient is tolerating radiation.  Plan:  Continue treatment as planned. OK to try Lindi products. Double effexor.

## 2011-12-31 NOTE — Progress Notes (Signed)
Patient presents to the clinic today unaccompanied for PUT with Dr. Michell Heinrich. Patient is alert and oriented to person, place, and time. No distress noted. Steady gait noted. Pleasant affect noted. Patient denies pain at this time. No skin changes noted. Patient reports using radiaplex and alra as directed. Patient reports fatigue. After discussion it has been decided that the combination of stress, hot flashes, decreased exercise, and decreased sleep due to hot flashes are the contributing factors to the fatigue at this point. Left arm lymphedema sleeve noted. Reported all findings to Dr. Michell Heinrich.

## 2012-01-01 ENCOUNTER — Encounter: Payer: Self-pay | Admitting: Nutrition

## 2012-01-01 ENCOUNTER — Ambulatory Visit
Admission: RE | Admit: 2012-01-01 | Discharge: 2012-01-01 | Disposition: A | Discharge status: Home / Self Care | Payer: BC Managed Care – PPO | Source: Ambulatory Visit | Attending: Radiation Oncology | Admitting: Radiation Oncology

## 2012-01-01 NOTE — Progress Notes (Signed)
Patient attended breast cancer nutrition class entitled food for your fight on Tuesday, 12/31/2011. Patient received diet education on eating healthfully during treatment for breast cancer.

## 2012-01-02 ENCOUNTER — Other Ambulatory Visit: Payer: Self-pay | Admitting: *Deleted

## 2012-01-02 ENCOUNTER — Ambulatory Visit
Admission: RE | Admit: 2012-01-02 | Discharge: 2012-01-02 | Disposition: A | Discharge status: Home / Self Care | Payer: BC Managed Care – PPO | Source: Ambulatory Visit | Attending: Radiation Oncology | Admitting: Radiation Oncology

## 2012-01-02 ENCOUNTER — Encounter: Payer: Self-pay | Admitting: Radiation Oncology

## 2012-01-02 MED ORDER — VENLAFAXINE HCL ER 75 MG PO CP24: 75.0000 mg | ORAL_CAPSULE | Freq: Every day | ORAL | Status: DC

## 2012-01-02 NOTE — Telephone Encounter (Signed)
Call from [facility], Rx for Effexor increase not received. Note on 12/27/11- ok to increase Effexor 75mg . Rx resent

## 2012-01-02 NOTE — Progress Notes (Signed)
Name: [name]   MRN: [id number]  Date:  01/02/2012   DOB: 06/26/1945  Status:outpatient    DIAGNOSIS: Breast cancer.  CONSENT VERIFIED: yes   SET UP: Patient is setup supine   IMMOBILIZATION:  The following immobilization was used:Custom Moldable Pillow, breast board.   NARRATIVE: [name] underwent complex simulation and treatment planning for her boost treatment today.  Her tumor volume was outlined on the planning CT scan. The depth of her cavity was 3.4 cm.    12  MeV electrons will be prescribed to the 90% Isodose line.   A block will be used for beam modification purposes.  A special port plan is requested.

## 2012-01-03 ENCOUNTER — Ambulatory Visit
Admission: RE | Admit: 2012-01-03 | Discharge: 2012-01-03 | Disposition: A | Discharge status: Home / Self Care | Payer: BC Managed Care – PPO | Source: Ambulatory Visit | Attending: Radiation Oncology | Admitting: Radiation Oncology

## 2012-01-03 ENCOUNTER — Other Ambulatory Visit: Payer: Self-pay | Admitting: *Deleted

## 2012-01-03 MED ORDER — VENLAFAXINE HCL 37.5 MG PO TABS: ORAL_TABLET | ORAL | Status: DC

## 2012-01-06 ENCOUNTER — Telehealth: Payer: Self-pay | Admitting: *Deleted

## 2012-01-06 ENCOUNTER — Ambulatory Visit
Admission: RE | Admit: 2012-01-06 | Discharge: 2012-01-06 | Disposition: A | Discharge status: Home / Self Care | Payer: BC Managed Care – PPO | Source: Ambulatory Visit | Attending: Radiation Oncology | Admitting: Radiation Oncology

## 2012-01-06 NOTE — Telephone Encounter (Signed)
Pt called with concerns about Effexor 37.5 mg Reviewed with pt her Effexor 37.5 mg was changed 01/03/12. Pt  to take 1-2 tablets daily. With 2 refills.  Pt's pharmacy is aware, medication should be ready for pick up. No further questions

## 2012-01-07 ENCOUNTER — Encounter: Payer: Self-pay | Admitting: Radiation Oncology

## 2012-01-07 ENCOUNTER — Ambulatory Visit
Admission: RE | Admit: 2012-01-07 | Discharge: 2012-01-07 | Disposition: A | Discharge status: Home / Self Care | Payer: BC Managed Care – PPO | Source: Ambulatory Visit | Attending: Radiation Oncology | Admitting: Radiation Oncology

## 2012-01-07 VITALS — BP 130/61 | HR 89 | Temp 98.9°F | Resp 20 | Wt 137.1 lb

## 2012-01-07 DIAGNOSIS — C50419 Malignant neoplasm of upper-outer quadrant of unspecified female breast: Secondary | ICD-10-CM

## 2012-01-07 NOTE — Progress Notes (Signed)
Patient here for weekly rad txs=11/21 completed, alert,oriented x3, increased her effexor today, can take 1-2 tabs of 37.5mg  ,patient took 2 today and feels grrat,no drowsiness, slept well, hotflashes still, breast with erythema start of dermatitis, no c/o of itching, 4:07 PM

## 2012-01-07 NOTE — Progress Notes (Signed)
Weekly Management Note Current Dose:29.37  Gy  Projected Dose: 52.72 Gy   Narrative:  The patient presents for routine under treatment assessment.  CBCT/MVCT images/Port film x-rays were reviewed.  The chart was checked. Slept for 4 hours with increased dose of effexor this am. Took hot bath and skin is more pink and irritated. Using radiaplex.   Physical Findings: Weight: 137 lb 1.6 oz (62.188 kg). Slightly pink left breast.   Impression:  The patient is tolerating radiation.  Plan:  Continue treatment as planned. Continue radiaplex.

## 2012-01-08 ENCOUNTER — Ambulatory Visit: Payer: BC Managed Care – PPO

## 2012-01-08 ENCOUNTER — Ambulatory Visit
Admission: RE | Admit: 2012-01-08 | Discharge: 2012-01-08 | Disposition: A | Discharge status: Home / Self Care | Payer: BC Managed Care – PPO | Source: Ambulatory Visit | Attending: Radiation Oncology | Admitting: Radiation Oncology

## 2012-01-09 ENCOUNTER — Ambulatory Visit: Payer: BC Managed Care – PPO

## 2012-01-09 ENCOUNTER — Ambulatory Visit
Admission: RE | Admit: 2012-01-09 | Discharge: 2012-01-09 | Disposition: A | Discharge status: Home / Self Care | Payer: BC Managed Care – PPO | Source: Ambulatory Visit | Attending: Radiation Oncology | Admitting: Radiation Oncology

## 2012-01-10 ENCOUNTER — Ambulatory Visit
Admission: RE | Admit: 2012-01-10 | Discharge: 2012-01-10 | Disposition: A | Discharge status: Home / Self Care | Payer: BC Managed Care – PPO | Source: Ambulatory Visit | Attending: Radiation Oncology | Admitting: Radiation Oncology

## 2012-01-10 ENCOUNTER — Ambulatory Visit: Payer: BC Managed Care – PPO

## 2012-01-13 ENCOUNTER — Ambulatory Visit
Admission: RE | Admit: 2012-01-13 | Discharge: 2012-01-13 | Disposition: A | Discharge status: Home / Self Care | Payer: BC Managed Care – PPO | Source: Ambulatory Visit | Attending: Radiation Oncology | Admitting: Radiation Oncology

## 2012-01-13 ENCOUNTER — Ambulatory Visit: Payer: BC Managed Care – PPO | Admitting: Nutrition

## 2012-01-13 ENCOUNTER — Ambulatory Visit: Payer: BC Managed Care – PPO

## 2012-01-13 NOTE — Progress Notes (Signed)
[name] is a 66 year old female patient of Dr.Khan diagnosed with breast cancer.  Past medical history includes IBS, GERD, anxiety, depression, and hypertension.  Medications include biotin, calcium carbonate, coenzyme Q10, multivitamin, omega-3 fatty acids, Prilosec, and Effexor.  Labs were reviewed.  Height: 64 inches. Weight: 137.1 pounds. Usual body weight: 135 pounds. BMI 23.52.  Patient recently attended the breast cancer nutrition class. She reports the class was very beneficial. She would like clarification on specific questions that she has regarding eating during breast cancer treatment. She verbalizes that many people are giving her unsolicited advice. She is confused regarding information that they have told her. She would like to eat a healthy diet.  Nutrition diagnosis: Food and nutrition related knowledge deficit related to diagnosis of breast cancer and associated treatments as evidenced by no prior need for nutrition related information.  Intervention: I educated patient on the importance of increasing plant-based foods to include fruits vegetables and whole grains. I've educated her on strategies for choosing lower sodium foods. I've reviewed label reading with her. I have answered her questions and provided additional fact sheets for patient to refer to as needed. Patient able to teach back strategies for planning a healthy menu. She has my contact information.  Monitoring, evaluation, goals: Patient will tolerate a healthy plant-based diet to promote weight maintenance throughout treatment.  Next visit: Patient will contact me for questions or concerns

## 2012-01-14 ENCOUNTER — Ambulatory Visit: Payer: BC Managed Care – PPO

## 2012-01-14 ENCOUNTER — Ambulatory Visit
Admission: RE | Admit: 2012-01-14 | Discharge: 2012-01-14 | Disposition: A | Discharge status: Home / Self Care | Payer: BC Managed Care – PPO | Source: Ambulatory Visit | Attending: Radiation Oncology | Admitting: Radiation Oncology

## 2012-01-14 ENCOUNTER — Encounter: Payer: Self-pay | Admitting: Radiation Oncology

## 2012-01-14 VITALS — BP 130/65 | HR 82 | Temp 98.2°F | Resp 20 | Wt 136.5 lb

## 2012-01-14 DIAGNOSIS — C50419 Malignant neoplasm of upper-outer quadrant of unspecified female breast: Secondary | ICD-10-CM

## 2012-01-14 MED ORDER — BIAFINE EX EMUL
Freq: Two times a day (BID) | CUTANEOUS | Status: DC
Start: 2012-01-14 — End: 2012-01-15
  Administered 2012-01-14: 16:00:00 via TOPICAL

## 2012-01-14 NOTE — Progress Notes (Signed)
Pt denies pain, does c/o fatigue, loss of appetite occasionally. She c/o hot flashes and chills. Effexor dose was increased which has been somewhat helpful. Applying Radiaplex to left breast tx area.

## 2012-01-14 NOTE — Progress Notes (Signed)
Weekly Management Note Current Dose: 42.72  Gy  Projected Dose: 52.72 Gy   Narrative:  The patient presents for routine under treatment assessment.  CBCT/MVCT images/Port film x-rays were reviewed.  The chart was checked. Doing well treatment wise.  Tired and upset about hot flashes. Husband is not being supportive.   Physical Findings: Weight: 136 lb 8 oz (61.916 kg). Dermatitis on outer aspects of breast. Patient is tearful.  Impression:  The patient is tolerating radiation.  Plan:  Continue treatment as planned. Switch to biafene. Ask SW to see.

## 2012-01-15 ENCOUNTER — Ambulatory Visit
Admission: RE | Admit: 2012-01-15 | Discharge: 2012-01-15 | Disposition: A | Discharge status: Home / Self Care | Payer: BC Managed Care – PPO | Source: Ambulatory Visit | Attending: Radiation Oncology | Admitting: Radiation Oncology

## 2012-01-15 ENCOUNTER — Ambulatory Visit: Payer: BC Managed Care – PPO

## 2012-01-16 ENCOUNTER — Ambulatory Visit: Payer: BC Managed Care – PPO

## 2012-01-16 ENCOUNTER — Ambulatory Visit
Admission: RE | Admit: 2012-01-16 | Discharge: 2012-01-16 | Disposition: A | Discharge status: Home / Self Care | Payer: BC Managed Care – PPO | Source: Ambulatory Visit | Attending: Radiation Oncology | Admitting: Radiation Oncology

## 2012-01-17 ENCOUNTER — Ambulatory Visit: Payer: BC Managed Care – PPO

## 2012-01-17 ENCOUNTER — Ambulatory Visit
Admission: RE | Admit: 2012-01-17 | Discharge: 2012-01-17 | Disposition: A | Discharge status: Home / Self Care | Payer: BC Managed Care – PPO | Source: Ambulatory Visit | Attending: Radiation Oncology | Admitting: Radiation Oncology

## 2012-01-17 ENCOUNTER — Encounter: Payer: Self-pay | Admitting: *Deleted

## 2012-01-17 NOTE — Progress Notes (Signed)
[facility] Brief Psychosocial Assessment Clinical Social Work  Clinical Social Work was referred by radiation oncologist for assessment of psychosocial needs.  Clinical Social Worker met with patient in office at [facility] to offer support and assess for needs.  Pt expressed feeling overwhelmed and anxious.  Pt's stated her primary concerns were medical bills and sleep.  Pt stated she is unable to sleep due to the "number of thoughts running through her head."  CSW and pt discussed relaxation techniques and CSW provided pt with a relaxation/mindfulness CD to listen to before she goes to bed.  CSW and pt also discussed other support programs at [facility].  Pt stated she had attended the breast cancer support group once before, and had been connected with a reach to recovery volunteer.  CSW and pt processed pt's feeling associated with her financial stresses, relationships, and importance of self-care.  Pt was agreeable to CSW making a referral to [facility] for continued counseling.  Pt also started she planned to attend breast cancer support group, and would consider participating in the Tai Chi and Yoga offered at [facility].  CSW offered additional support and encouraged pt to call with any questions or concerns.             Tamala Julian, MSW, LCSW Clinical Social Worker [facility] [phone]

## 2012-01-20 ENCOUNTER — Ambulatory Visit
Admission: RE | Admit: 2012-01-20 | Discharge: 2012-01-20 | Disposition: A | Discharge status: Home / Self Care | Payer: BC Managed Care – PPO | Source: Ambulatory Visit | Attending: Radiation Oncology | Admitting: Radiation Oncology

## 2012-01-20 ENCOUNTER — Ambulatory Visit: Payer: BC Managed Care – PPO

## 2012-01-21 ENCOUNTER — Ambulatory Visit: Payer: BC Managed Care – PPO

## 2012-01-21 ENCOUNTER — Ambulatory Visit
Admission: RE | Admit: 2012-01-21 | Discharge: 2012-01-21 | Disposition: A | Discharge status: Home / Self Care | Payer: BC Managed Care – PPO | Source: Ambulatory Visit | Attending: Radiation Oncology | Admitting: Radiation Oncology

## 2012-01-21 ENCOUNTER — Encounter: Payer: Self-pay | Admitting: Radiation Oncology

## 2012-01-21 VITALS — BP 123/60 | HR 78 | Resp 18 | Wt 135.4 lb

## 2012-01-21 DIAGNOSIS — C50419 Malignant neoplasm of upper-outer quadrant of unspecified female breast: Secondary | ICD-10-CM

## 2012-01-21 NOTE — Progress Notes (Signed)
Patient presents to the clinic today accompanied by her daughter for an under treat visit with Dr. Roselind Messier on her last day. Patient is alert and oriented to person, place, and time. No distress noted. Steady gait noted. Pleasant affect noted. Patient denies pain at this time. Hyperpigmentation of left/treated breast note with dermatitis on chest wall and dry desquamation under left axilla. Instructed patient to continue to use biafine cream and neosporin under left axilla. Patient reports fatigue. Provided patient with one month follow up appointment card. Provided patient with and reviewed [facility] and [facility] flyer. Patient verbalized understanding of all reviewed. Reported all findings to Dr. Roselind Messier.

## 2012-01-21 NOTE — Progress Notes (Signed)
[facility]    Radiation Oncology [address] [place]     [name], M.D. [place], [place] [address]               [name], M.D., Ph.D. Phone: [phone]      [name], M.D. Fax: [phone]      [name], M.D., Ph.D.         [name], M.D.         [name], M.D Weekly Treatment Management Note  Name: [name]     MRN: [id number]        CSN: [id number] Date: 01/21/2012      DOB: 1945/08/03  CC: Catherine Kim, PA         [place]    Status: Outpatient  Diagnosis: The encounter diagnosis was Cancer of upper-outer quadrant of female breast.  Current Dose: 5272 cGy  Current Fraction: 21  Planned Dose: 5272 cGy  Narrative: [name] was seen today for weekly treatment management. The chart was checked and port films  were reviewed. She completes her radiation therapy today. She does have some soreness along the lateral chest wall region near the axilla. She reports minimal discomfort in the actual central breast area.  Review of patient's allergies indicates no known allergies.  Current Outpatient Prescriptions  Medication Sig Dispense Refill  . BIOTIN PO Take 550 mg by mouth.      . calcium carbonate 200 MG capsule Take 250 mg by mouth 2 (two) times daily with a meal.      . co-enzyme Q-10 30 MG capsule Take 300 mg by mouth daily.      . diclofenac sodium (VOLTAREN) 1 % GEL Apply 1 application topically.      Marland Kitchen emollient (BIAFINE) cream Apply topically as needed.      . ezetimibe (ZETIA) 10 MG tablet Take 10 mg by mouth daily.      Marland Kitchen gabapentin (NEURONTIN) 100 MG capsule Take 100 mg by mouth as needed.      Marland Kitchen ibuprofen (ADVIL,MOTRIN) 200 MG tablet Take 200 mg by mouth every 6 (six) hours as needed.      Marland Kitchen Linaclotide (LINZESS) 145 MCG CAPS Take by mouth daily.      . Multiple Vitamin (MULTIVITAMIN) tablet Take 1 tablet by mouth daily.      . non-metallic deodorant Thornton Papas) MISC Apply 1 application topically daily as  needed.      . Omega-3 Fatty Acids (FISH OIL PO) Take 1 each by mouth.      Marland Kitchen omeprazole (PRILOSEC) 20 MG capsule Take 20 mg by mouth daily.      . quinapril-hydrochlorothiazide (ACCURETIC) 10-12.5 MG per tablet Take 1 tablet by mouth daily.      . rifaximin (XIFAXAN) 550 MG TABS Take 550 mg by mouth.      . tretinoin (RETIN-A) 0.05 % cream Apply topically at bedtime.      Marland Kitchen venlafaxine (EFFEXOR) 37.5 MG tablet 37.5 mg. Take 1-2 tablets daily or as directed,   Just doubled dose today to 75mg , total daily      . Wound Cleansers (RADIAPLEX EX) Apply topically.       Labs:  Lab Results  Component Value Date   WBC 6.7 10/02/2011   HGB 13.0 10/18/2011   HCT 40.4 10/02/2011   MCV 94.1 10/02/2011   PLT 293 10/02/2011   Lab Results  Component Value Date   CREATININE 0.73 10/17/2011   BUN 11  10/17/2011   NA 137 10/17/2011   K 3.2* 10/17/2011   CL 98 10/17/2011   CO2 29 10/17/2011   Lab Results  Component Value Date   ALT 10 10/02/2011   AST 15 10/02/2011   BILITOT 0.2* 10/02/2011    Physical Examination:  weight is 135 lb 6.4 oz (61.417 kg). Her blood pressure is 123/60 and her pulse is 78. Her respiration is 18.    Wt Readings from Last 3 Encounters:  01/21/12 135 lb 6.4 oz (61.417 kg)  01/14/12 136 lb 8 oz (61.916 kg)  01/07/12 137 lb 1.6 oz (62.188 kg)     Lungs - Normal respiratory effort, chest expands symmetrically. Lungs are clear to auscultation, no crackles or wheezes.  Heart has regular rhythm and rate  Abdomen is soft and non tender with normal bowel sounds The left breast area shows hyperpigmentation changes and erythema. There is no moist desquamation noted.  Assessment:  Patient tolerated treatments well  Plan: Routine followup with Dr. Michell Heinrich in one month

## 2012-01-22 ENCOUNTER — Ambulatory Visit (INDEPENDENT_AMBULATORY_CARE_PROVIDER_SITE_OTHER): Payer: BC Managed Care – PPO | Admitting: Licensed Clinical Social Worker

## 2012-01-22 DIAGNOSIS — F331 Major depressive disorder, recurrent, moderate: Secondary | ICD-10-CM

## 2012-01-23 NOTE — Progress Notes (Signed)
  Radiation Oncology         [phone]) [phone] ________________________________  Name: [name] MRN: [id number]  Date: 01/21/2012  DOB: [birth year]  End of Treatment Note  Diagnosis:  T1aN0 Invasive Ductal Carcinoma of the left breast  Indication for treatment:  Curative       Radiation treatment dates:   12/24/2011-01/21/2012  Site/dose:    Left breast / 42.72 Wallace Cullens @ 2.67 Wallace Cullens per fraction x 16 fractions Left breast boost / 10 Gray at 2 Gray per fraction x 5 fractions  Beams/energy:  Opposed Tangents / 6 MV photons En face / 12 MeV electrons   Narrative: The patient tolerated radiation treatment relatively well.   She struggled with hot flashes and anxiety.   Plan: The patient has completed radiation treatment. The patient will return to radiation oncology clinic for routine followup in one month. I advised them to call or return sooner if they have any questions or concerns related to their recovery or treatment.  ------------------------------------------------  [name], MD

## 2012-01-23 NOTE — Progress Notes (Signed)
  Radiation Oncology         [phone] ________________________________  Name: [name] MRN: [id number]  Date: 12/23/2011  DOB: 06/15/1945  Simulation Verification Note  Status: outpatient  NARRATIVE: The patient was brought to the treatment unit and placed in the planned treatment position. The clinical setup was verified. Then port films were obtained and uploaded to the radiation oncology medical record software.  The treatment beams were carefully compared against the planned radiation fields. The position location and shape of the radiation fields was reviewed. The targeted volume of tissue appears appropriately covered by the radiation beams. Organs at risk appear to be excluded as planned.  Based on my personal review, I approved the simulation verification. The patient's treatment will proceed as planned.  ------------------------------------------------  [name], MD

## 2012-01-29 ENCOUNTER — Ambulatory Visit: Payer: BC Managed Care – PPO | Admitting: Licensed Clinical Social Worker

## 2012-01-30 ENCOUNTER — Telehealth: Payer: Self-pay | Admitting: Radiation Oncology

## 2012-01-30 NOTE — Telephone Encounter (Signed)
Phoned patient at home number listed in demographics to inform her that Dr. Michell Heinrich emailed "release" to [facility] for her to participate in [facility] class. Patient states,"well I haven't heard from them." Encouraged patient to contact [facility] facility later today and check status. Patient reports that she continues to have an area of breakdown under her arm. Encouraged patient to continue with provided cream bid and apply neosporin to area under axilla. Encouraged patient to call with future needs. Patient verbalized understanding of all things reviewed and expressed appreciation for the call.

## 2012-02-03 ENCOUNTER — Ambulatory Visit (INDEPENDENT_AMBULATORY_CARE_PROVIDER_SITE_OTHER): Payer: BC Managed Care – PPO | Admitting: Licensed Clinical Social Worker

## 2012-02-03 DIAGNOSIS — F331 Major depressive disorder, recurrent, moderate: Secondary | ICD-10-CM

## 2012-02-04 ENCOUNTER — Encounter (INDEPENDENT_AMBULATORY_CARE_PROVIDER_SITE_OTHER): Payer: Self-pay | Admitting: General Surgery

## 2012-02-04 ENCOUNTER — Ambulatory Visit (INDEPENDENT_AMBULATORY_CARE_PROVIDER_SITE_OTHER): Payer: BC Managed Care – PPO | Admitting: General Surgery

## 2012-02-04 VITALS — BP 126/74 | HR 70 | Temp 97.8°F | Resp 16 | Ht 64.0 in | Wt 135.0 lb

## 2012-02-04 DIAGNOSIS — C50419 Malignant neoplasm of upper-outer quadrant of unspecified female breast: Secondary | ICD-10-CM

## 2012-02-04 NOTE — Patient Instructions (Signed)
Plan for physical therapy Continue regular self exams

## 2012-02-04 NOTE — Progress Notes (Signed)
Subjective:     Patient ID: [name], female   DOB: 06/03/1945, 66 y.o.   MRN: [id number]  HPI The patient is a 66 year old white female who is 3 months status post left breast lumpectomy and negative sentinel node biopsy for a T1 MIC N0 left breast cancer. She was strongly ER and PR positive. She finished radiation therapy last Tuesday. She still has some sensitivity around her nipple. She also still has tenderness in the left breast. She is planning to meet with Dr. Park Breed next week to talk about antiestrogen therapy. She is already experiencing a lot of hot flashes.  Review of Systems  Constitutional: Negative.   HENT: Negative.   Eyes: Negative.   Respiratory: Negative.   Cardiovascular: Negative.   Gastrointestinal: Negative.   Genitourinary: Negative.   Musculoskeletal: Negative.   Skin: Negative.   Neurological: Negative.   Hematological: Negative.   Psychiatric/Behavioral: Negative.        Objective:   Physical Exam  Constitutional: She is oriented to person, place, and time. She appears well-developed and well-nourished.  HENT:  Head: Normocephalic and atraumatic.  Eyes: Conjunctivae normal and EOM are normal. Pupils are equal, round, and reactive to light.  Neck: Normal range of motion. Neck supple.  Cardiovascular: Normal rate, regular rhythm and normal heart sounds.   Pulmonary/Chest: Effort normal and breath sounds normal.       The patient has some fullness beneath her left breast incision. Her skin is healing well after radiation therapy. There is no palpable mass in either breast. There is no palpable axillary or supraclavicular cervical lymphadenopathy.  Abdominal: Soft. Bowel sounds are normal. She exhibits no mass. There is no tenderness.  Musculoskeletal: Normal range of motion.  Lymphadenopathy:    She has no cervical adenopathy.  Neurological: She is alert and oriented to person, place, and time.  Skin: Skin is warm and dry.  Psychiatric: She has a  normal mood and affect. Her behavior is normal.       Assessment:     3 months status post left breast lumpectomy for a microscopically invasive cancer.    Plan:     At this point she will talk with Dr. Park Breed about starting into estrogen therapy. She will continue to do regular self exams. We will plan to see her back in 3 months.

## 2012-02-06 ENCOUNTER — Telehealth: Payer: Self-pay | Admitting: *Deleted

## 2012-02-06 ENCOUNTER — Other Ambulatory Visit: Payer: BC Managed Care – PPO | Admitting: Lab

## 2012-02-06 ENCOUNTER — Ambulatory Visit (HOSPITAL_BASED_OUTPATIENT_CLINIC_OR_DEPARTMENT_OTHER): Payer: BC Managed Care – PPO | Admitting: Oncology

## 2012-02-06 ENCOUNTER — Encounter: Payer: Self-pay | Admitting: Oncology

## 2012-02-06 ENCOUNTER — Ambulatory Visit (HOSPITAL_COMMUNITY)
Admission: RE | Admit: 2012-02-06 | Discharge: 2012-02-06 | Disposition: A | Discharge status: Home / Self Care | Payer: BC Managed Care – PPO | Source: Ambulatory Visit | Attending: Oncology | Admitting: Oncology

## 2012-02-06 VITALS — BP 129/69 | HR 82 | Temp 98.2°F | Resp 20 | Ht 64.0 in | Wt 137.2 lb

## 2012-02-06 DIAGNOSIS — R0602 Shortness of breath: Secondary | ICD-10-CM | POA: Insufficient documentation

## 2012-02-06 DIAGNOSIS — Z170 Estrogen receptor positive status [ER+]: Secondary | ICD-10-CM

## 2012-02-06 DIAGNOSIS — C50419 Malignant neoplasm of upper-outer quadrant of unspecified female breast: Secondary | ICD-10-CM | POA: Insufficient documentation

## 2012-02-06 NOTE — Progress Notes (Signed)
OFFICE PROGRESS NOTE  CC  [place] [address] [place] [address] Dr. Lurline Cameron  DIAGNOSIS: Invasive ductal carcinoma of the left breast (T1aN0) stage I  PRIOR THERAPY: 1. S/p Lumpectomy with sentinel node biopsy with the final showing an ER+ T1aN0 invasive ductal carcinoma with DCIS  #2 patient is now status post radiation therapy completed on 01/21/2012.  #3 eventually she will begin antiestrogen therapy consisting of Arimidex 1 mg daily.  CURRENT THERAPY: Patient will begin Arimidex 1 mg daily after Thanksgiving.  INTERVAL HISTORY: [name] 66 y.o. female returns for follow up visit over. Overall she is doing well without any significant complaints she has no nausea vomiting she does have some fatigue she has also complaining of having difficulty taking deep breaths. I do think that she has significant amount of anxiety. She has been doing a lot of reading about her breast cancer. We again went over her pathology as well as treatment options. I do think after the visit she felt much better and relieved. She is experiencing hot flashes. She is on Effexor 75 mg daily for the last 2 weeks. I have recommended that she continue taking this dose for now and hopefully her hot flashes will settle down. We did discuss the possibility of her exercising including doing yoga as well as doing some acupuncture which may help her in terms of her anxiety as well as the hot flashes. Remainder of the 10 point review of systems is unremarkable. MEDICAL HISTORY: Past Medical History  Diagnosis Date  . IBS (irritable bowel syndrome)   . Arthritis   . Breast cancer   . GERD (gastroesophageal reflux disease)   . H/O colonoscopy   . H/O bone density study   . Night sweats   . Sinus complaint   . Heartburn   . Psoriasis   . Anxiety   . Depression   . Hot flashes   . Dysrhythmia     palpitations  . Hypertension     ALLERGIES:   has no known  allergies.  MEDICATIONS:  Current Outpatient Prescriptions  Medication Sig Dispense Refill  . BIOTIN PO Take 550 mg by mouth.      . calcium carbonate 200 MG capsule Take 250 mg by mouth 2 (two) times daily with a meal.      . diclofenac sodium (VOLTAREN) 1 % GEL Apply 1 application topically.      Marland Kitchen emollient (BIAFINE) cream Apply topically as needed.      . ezetimibe (ZETIA) 10 MG tablet Take 10 mg by mouth daily.      Marland Kitchen gabapentin (NEURONTIN) 100 MG capsule Take 100 mg by mouth as needed.      Marland Kitchen ibuprofen (ADVIL,MOTRIN) 200 MG tablet Take 200 mg by mouth every 6 (six) hours as needed.      Marland Kitchen Linaclotide (LINZESS) 145 MCG CAPS Take by mouth daily.      . Multiple Vitamin (MULTIVITAMIN) tablet Take 1 tablet by mouth daily.      . non-metallic deodorant [name]) MISC Apply 1 application topically daily as needed.      . Omega-3 Fatty Acids (FISH OIL PO) Take 1 each by mouth.      Marland Kitchen omeprazole (PRILOSEC) 20 MG capsule Take 20 mg by mouth daily.      . quinapril-hydrochlorothiazide (ACCURETIC) 10-12.5 MG per tablet Take 1 tablet by mouth daily.      . rifaximin (XIFAXAN) 550 MG TABS Take 550 mg by mouth.      Marland Kitchen  tretinoin (RETIN-A) 0.05 % cream Apply topically at bedtime.      Marland Kitchen venlafaxine (EFFEXOR) 37.5 MG tablet 37.5 mg. Take 1-2 tablets daily or as directed,   Just doubled dose today to 75mg , total daily      . [name] (RADIAPLEX EX) Apply topically.      Marland Kitchen co-enzyme Q-10 30 MG capsule Take 300 mg by mouth daily.        SURGICAL HISTORY:  Past Surgical History  Procedure Date  . Cataract extraction   . Tonsillectomy and adenoidectomy   . Dilation and curettage of uterus   . Abdominal hysterectomy   . Hand surgery     RSD  . Colonoscopy   . Upper gastrointestinal endoscopy   . Breast surgery 10/18/2011    LT br lumpectomy    REVIEW OF SYSTEMS:  A comprehensive review of systems was negative.   PHYSICAL EXAMINATION: General appearance: alert, cooperative and appears  stated age Lymph nodes: Cervical, supraclavicular, and axillary nodes normal. Resp: clear to auscultation bilaterally Cardio: regular rate and rhythm GI: soft, non-tender; bowel sounds normal; no masses,  no organomegaly Extremities: extremities normal, atraumatic, no cyanosis or edema Neurologic: Grossly normal Left breat incision healing well  ECOG PERFORMANCE STATUS: 0 - Asymptomatic  Blood pressure 129/69, pulse 82, temperature 98.2 F (36.8 C), resp. rate 20, height 5\' 4"  (1.626 m), weight 137 lb 3.2 oz (62.234 kg).  LABORATORY DATA: Lab Results  Component Value Date   WBC 6.7 10/02/2011   HGB 13.0 10/18/2011   HCT 40.4 10/02/2011   MCV 94.1 10/02/2011   PLT 293 10/02/2011      Chemistry      Component Value Date/Time   NA 137 10/17/2011 1502   K 3.2* 10/17/2011 1502   CL 98 10/17/2011 1502   CO2 29 10/17/2011 1502   BUN 11 10/17/2011 1502   CREATININE 0.73 10/17/2011 1502      Component Value Date/Time   CALCIUM 9.2 10/17/2011 1502   ALKPHOS 73 10/02/2011 1603   AST 15 10/02/2011 1603   ALT 10 10/02/2011 1603   BILITOT 0.2* 10/02/2011 1603     ADDITIONAL INFORMATION: PROGNOSTIC INDICATORS - ACIS Results There is insufficient carcinoma present for analysis. All controls stained appropriately Catherine Leisure MD Pathologist, Electronic Signature ( Signed 10/02/2011) CHROMOGENIC IN-SITU HYBRIDIZATION Interpretation: There is insufficient carcinoma present for Her2 analysis. Reference range: Ratio: HER2:CEP17 < 1.8 - gene amplification not observed Ratio: HER2:CEP 17 1.8-2.2 - equivocal result Ratio: HER2:CEP17 > 2.2 - gene amplification observed Catherine Leisure MD Pathologist, Electronic Signature ( Signed 10/01/2011) FINAL DIAGNOSIS 1 of 3 FINAL for [name] ([id number]) Diagnosis Breast, left, needle core biopsy, UOQ - INVASIVE DUCTAL CARCINOMA WITH MUCINOUS FEATURES. - ATYPICAL HYPERPLASIA WITH ASSOCIATED CALCIFICATION. - FIBROCYSTIC CHANGES WITH ASSOCIATED  CALCIFICATION. - SEE COMMENT. Microscopic Comment Although definitive grading of breast carcinoma is best done on excision, the features of the tumor from the left upper outer quadrant needle biopsy are compatible with a grade I breast carcinoma with mucinous features. Very little tumor is present on histologic examination. A breast prognostic profile will be attempted, although, the paucity of tumor may be quantitatively insufficient. The findings are called to [facility] on 09/26/11. Both Dr. Colonel Bald and Dr. Frederica Kuster have seen this case in consultation with agreement that there is invasive ductal carcinoma present. (RAH:gt, 09/26/11) Zandra Abts MD Pathologist, Electronic Signature (Case signed 09/26/2011) Specimen G  ADDITIONAL INFORMATION: 3. PROGNOSTIC INDICATORS - ACIS Results IMMUNOHISTOCHEMICAL  AND MORPHOMETRIC ANALYSIS BY THE AUTOMATED CELLULAR IMAGING SYSTEM (ACIS) Estrogen Receptor (Negative, <1%): 99%, STRONG STAINING INTENSITY Progesterone Receptor (Negative, <1%): 100%, STRONG STAINING INTENSIT Y All controls stained appropriately Catherine Leisure MD Pathologist, Electronic Signature ( Signed 10/23/2011) FINAL DIAGNOSIS Diagnosis 1. Lymph node, sentinel, biopsy, Left #1 - THERE IS NO EVIDENCE OF CARCINOMA IN 1 OF 1 LYMPH NODE (0/1). 2. Lymph node, sentinel, biopsy, Left #2 - THERE IS NO EVIDENCE OF CARCINOMA IN 1 OF 1 LYMPH NODE (0/1). 3. Breast, lumpectomy, Left - DUCTAL CARCINOMA IN SITU WITH ASSOCIATED MUCIN, LOW GRADE, SPANNING 1.2 CM. - THE SURGICAL RESECTION MARGINS ARE NEGATIVE FOR DUCTAL CARCINOMA. - SEE ONCOLOGY TABLE BELOW. Microscopic Comment 3. BREAST, INVASIVE TUMOR, WITH LYMPH NODE SAMPLING (Including data from patient's previous core biopsy, [id number]) 1 of 3 FINAL for Graffius, Tria ([id number]) Microscopic Comment(continued) Specimen, including laterality: Left breast Procedure: Needle localized lumpectomy Grade: I Tubule  formation: 1 Nuclear pleomorphism: 1 Mitotic: 1 Tumor size (glass slide measurement): less than 0.1 cm Margins: Negative for carcinoma Invasive, distance to closest margin: Greater than 0.2 cm to all margins In-situ, distance to closest margin: Greater than 0.2 cm to all margins Lymphovascular invasion: Not identified Ductal carcinoma in situ: Present Grade: Low grade Extensive intraductal component: Yes Lobular neoplasia: Not identified Tumor focality: Unifocal Treatment effect: N/A Extent of tumor: Confined to breast parenchyma Lymph nodes: # examined: 2 Lymph nodes with metastasis: 0 Breast prognostic profile: Estrogen and progesterone receptor studies will be performed on the current case and the results reported separately. Non-neoplastic breast: Healing biopsy site TNM: [id number], pN0 Comments: The current specimen contains low grade ductal carcinoma in situ associated with extracellular mucin. In addition, there are scattered acellular pools of mucin. However, no carcinoma cell are identified within these pools The size and grade of the invasive component are based on the patient's previous core biopsy. Of note, the entire localized area of the current specimen has been submitted for histologic evaluation. (JBK:kh 10-21-11)  RADIOGRAPHIC STUDIES:  Nm Sentinel Node Inj-no Rpt (breast)  10/18/2011  CLINICAL DATA: left breast cancer   Sulfur colloid was injected intradermally by the nuclear medicine  technologist for breast cancer sentinel node localization.     Mm Breast Surgical Specimen  10/18/2011  *RADIOLOGY REPORT*  Clinical Data:  Mucinous carcinoma, left breast.  NEEDLE LOCALIZATION WITH MAMMOGRAPHIC GUIDANCE AND SPECIMEN RADIOGRAPH  Comparison:  Previous exams  Patient presents for needle localization prior to left lumpectomy. I met with the patient and we discussed the procedure of needle localization including benefits and alternatives. We discussed the high likelihood of a  successful procedure. We discussed the risks of the procedure, including infection, bleeding, tissue injury, and further surgery. Informed, written consent was given.  Using mammographic guidance, sterile technique, 2% lidocaine and a 7 cm modified Kopans needle, the clip localized using a lateral approach.  The films are marked for Dr. Carolynne Edouard.  Specimen radiograph was performed [facility], and confirms the clip, residual calcifications and wire are present in the tissue sample.  The specimen is marked for pathology.  IMPRESSION: Needle localization left breast.  No apparent complications.  Original Report Authenticated By: Hiram Gash, M.D.   Mm Breast Wire Localization Left  10/18/2011  *RADIOLOGY REPORT*  Clinical Data:  Mucinous carcinoma, left breast.  NEEDLE LOCALIZATION WITH MAMMOGRAPHIC GUIDANCE AND SPECIMEN RADIOGRAPH  Comparison:  Previous exams  Patient presents for needle localization prior to left lumpectomy. I met with the patient and we discussed the  procedure of needle localization including benefits and alternatives. We discussed the high likelihood of a successful procedure. We discussed the risks of the procedure, including infection, bleeding, tissue injury, and further surgery. Informed, written consent was given.  Using mammographic guidance, sterile technique, 2% lidocaine and a 7 cm modified Kopans needle, the clip localized using a lateral approach.  The films are marked for Dr. Carolynne Edouard.  Specimen radiograph was performed at [facility], and confirms the clip, residual calcifications and wire are present in the tissue sample.  The specimen is marked for pathology.  IMPRESSION: Needle localization left breast.  No apparent complications.  Original Report Authenticated By: Hiram Gash, M.D.    ASSESSMENT: 66 year old female with:  #1 invasive ductal carcinoma of the left breast s/p left lumpectomy. The final path showed minimla focus of invasion with the remaining area  primarily DCIS, ER+ PR+.  #2 patient has now completed radiation therapy as of November of 5 2013.  #3 patient and I discussed antiestrogen therapy adjuvantly. Since patient had invasive disease she certainly would be able to receive either tamoxifen or any of the aromatase inhibitors. Patient is concerned about being fatigued and she complains of having some shortness of breath. I do think that she has significant amount of anxiety. She had a lot of questions which we discussed today for about 40 minutes. Because of her fatigue and anxiety and just not feeling well we will plan on not starting her on antiestrogen therapy until after Thanksgiving.   PLAN:  #1 we will obtain a chest x-ray.  #2 we discussed healthy diet and exercise program.  #3 she will return in about 3-4 weeks' time for followup and we will at that time started her on antiestrogen therapy with most likely arimidex1 milligram daily. Since she is concerned about doing tamoxifen  All questions were answered. The patient knows to call the clinic with any problems, questions or concerns. We can certainly see the patient much sooner if necessary.  I spent 40 minutes counseling the patient face to face. The total time spent in the appointment was 30 minutes.    Drue Second, MD Medical/Oncology [facility] [phone] (beeper) [phone] (Office)  02/06/2012, 1:41 PM

## 2012-02-06 NOTE — Patient Instructions (Addendum)
Chest x-ray  Continue effexor  Begin good exercise and eating healthy program

## 2012-02-06 NOTE — Telephone Encounter (Signed)
Message copied by Cooper Render on Thu Feb 06, 2012  5:04 PM ------      Message from: Victorino December      Created: Thu Feb 06, 2012  2:39 PM       Call patient: chest x-ray normal

## 2012-02-06 NOTE — Telephone Encounter (Signed)
Gave patient appointment for walk in chest x-ray   Gave patient appointment for 02-24-2012

## 2012-02-06 NOTE — Telephone Encounter (Signed)
Per MD, notified pt Chest Xray normal. Pt verbalized understanding 

## 2012-02-11 ENCOUNTER — Ambulatory Visit: Payer: BC Managed Care – PPO | Admitting: Physical Therapy

## 2012-02-12 ENCOUNTER — Ambulatory Visit: Payer: BC Managed Care – PPO | Admitting: Licensed Clinical Social Worker

## 2012-02-24 ENCOUNTER — Ambulatory Visit (HOSPITAL_BASED_OUTPATIENT_CLINIC_OR_DEPARTMENT_OTHER): Payer: BC Managed Care – PPO | Admitting: Oncology

## 2012-02-24 ENCOUNTER — Encounter: Payer: Self-pay | Admitting: Oncology

## 2012-02-24 ENCOUNTER — Ambulatory Visit (INDEPENDENT_AMBULATORY_CARE_PROVIDER_SITE_OTHER): Payer: BC Managed Care – PPO | Admitting: Licensed Clinical Social Worker

## 2012-02-24 ENCOUNTER — Other Ambulatory Visit (HOSPITAL_BASED_OUTPATIENT_CLINIC_OR_DEPARTMENT_OTHER): Payer: BC Managed Care – PPO | Admitting: Lab

## 2012-02-24 ENCOUNTER — Telehealth: Payer: Self-pay | Admitting: *Deleted

## 2012-02-24 VITALS — BP 139/72 | HR 78 | Temp 97.9°F | Resp 20 | Ht 64.0 in | Wt 138.0 lb

## 2012-02-24 DIAGNOSIS — Z170 Estrogen receptor positive status [ER+]: Secondary | ICD-10-CM

## 2012-02-24 DIAGNOSIS — F331 Major depressive disorder, recurrent, moderate: Secondary | ICD-10-CM

## 2012-02-24 DIAGNOSIS — C50419 Malignant neoplasm of upper-outer quadrant of unspecified female breast: Secondary | ICD-10-CM

## 2012-02-24 LAB — CBC WITH DIFFERENTIAL/PLATELET
BASO%: 1 % (ref 0.0–2.0)
Basophils Absolute: 0.1 10*3/uL (ref 0.0–0.1)
EOS%: 7.9 % — ABNORMAL HIGH (ref 0.0–7.0)
HGB: 13.9 g/dL (ref 11.6–15.9)
MCH: 33 pg (ref 25.1–34.0)
MCHC: 35 g/dL (ref 31.5–36.0)
MCV: 94.4 fL (ref 79.5–101.0)
MONO%: 10 % (ref 0.0–14.0)
NEUT%: 58.4 % (ref 38.4–76.8)
RDW: 13.2 % (ref 11.2–14.5)

## 2012-02-24 LAB — COMPREHENSIVE METABOLIC PANEL (CC13)
ALT: 17 U/L (ref 0–55)
Albumin: 3.7 g/dL (ref 3.5–5.0)
CO2: 29 mEq/L (ref 22–29)
Calcium: 9.5 mg/dL (ref 8.4–10.4)
Glucose: 81 mg/dl (ref 70–99)
Potassium: 3.8 mEq/L (ref 3.5–5.1)
Total Bilirubin: 0.4 mg/dL (ref 0.20–1.20)
Total Protein: 6.6 g/dL (ref 6.4–8.3)

## 2012-02-24 MED ORDER — TAMOXIFEN CITRATE 20 MG PO TABS: 20.0000 mg | ORAL_TABLET | Freq: Every day | ORAL | Status: DC

## 2012-02-24 NOTE — Patient Instructions (Addendum)
Proceed with tamoxifen 20 mg daily  I will see you back in 3 months. But call us if you need to be seen sooner  Tamoxifen oral tablet What is this medicine? TAMOXIFEN (ta MOX i fen) blocks the effects of estrogen. It is commonly used to treat breast cancer. It is also used to decrease the chance of breast cancer coming back in women who have received treatment for the disease. It may also help prevent breast cancer in women who have a high risk of developing breast cancer. This medicine may be used for other purposes; ask your health care provider or pharmacist if you have questions. What should I tell my health care provider before I take this medicine? They need to know if you have any of these conditions: -blood clots -blood disease -cataracts or impaired eyesight -endometriosis -high calcium levels -high cholesterol -irregular menstrual cycles -liver disease -stroke -uterine fibroids -an unusual or allergic reaction to tamoxifen, other medicines, foods, dyes, or preservatives -pregnant or trying to get pregnant -breast-feeding How should I use this medicine? Take this medicine by mouth with a glass of water. Follow the directions on the prescription label. You can take it with or without food. Take your medicine at regular intervals. Do not take your medicine more often than directed. Do not stop taking except on your doctor's advice. A special MedGuide will be given to you by the pharmacist with each prescription and refill. Be sure to read this information carefully each time. Talk to your pediatrician regarding the use of this medicine in children. While this drug may be prescribed for selected conditions, precautions do apply. Overdosage: If you think you have taken too much of this medicine contact a poison control center or emergency room at once. NOTE: This medicine is only for you. Do not share this medicine with others. What if I miss a dose? If you miss a dose, take it as  soon as you can. If it is almost time for your next dose, take only that dose. Do not take double or extra doses. What may interact with this medicine? -aminoglutethimide -bromocriptine -chemotherapy drugs -female hormones, like estrogens and birth control pills -letrozole -medroxyprogesterone -phenobarbital -rifampin -warfarin This list may not describe all possible interactions. Give your health care provider a list of all the medicines, herbs, non-prescription drugs, or dietary supplements you use. Also tell them if you smoke, drink alcohol, or use illegal drugs. Some items may interact with your medicine. What should I watch for while using this medicine? Visit your doctor or health care professional for regular checks on your progress. You will need regular pelvic exams, breast exams, and mammograms. If you are taking this medicine to reduce your risk of getting breast cancer, you should know that this medicine does not prevent all types of breast cancer. If breast cancer or other problems occur, there is no guarantee that it will be found at an early stage. Do not become pregnant while taking this medicine or for 2 months after stopping this medicine. Stop taking this medicine if you get pregnant or think you are pregnant and contact your doctor. This medicine may harm your unborn baby. Women who can possibly become pregnant should use birth control methods that do not use hormones during tamoxifen treatment and for 2 months after therapy has stopped. Talk with your health care provider for birth control advice. Do not breast feed while taking this medicine. What side effects may I notice from receiving this medicine? Side  effects that you should report to your doctor or health care professional as soon as possible: -changes in vision (blurred vision) -changes in your menstrual cycle -difficulty breathing or shortness of breath -difficulty walking or talking -new breast  lumps -numbness -pelvic pain or pressure -redness, blistering, peeling or loosening of the skin, including inside the mouth -skin rash or itching (hives) -sudden chest pain -swelling of lips, face, or tongue -swelling, pain or tenderness in your calf or leg -unusual bruising or bleeding -vaginal discharge that is bloody, brown, or rust -weakness -yellowing of the whites of the eyes or skin Side effects that usually do not require medical attention (report to your doctor or health care professional if they continue or are bothersome): -fatigue -hair loss, although uncommon and is usually mild -headache -hot flashes -impotence (in men) -nausea, vomiting (mild) -vaginal discharge (white or clear) This list may not describe all possible side effects. Call your doctor for medical advice about side effects. You may report side effects to FDA at 1-800-FDA-1088. Where should I keep my medicine? Keep out of the reach of children. Store at room temperature between 20 and 25 degrees C (68 and 77 degrees F). Protect from light. Keep container tightly closed. Throw away any unused medicine after the expiration date. NOTE: This sheet is a summary. It may not cover all possible information. If you have questions about this medicine, talk to your doctor, pharmacist, or health care provider.  2012, [employer]. (11/19/2007 12:01:56 PM)

## 2012-02-24 NOTE — Progress Notes (Signed)
OFFICE PROGRESS NOTE  CC  [place] [address] [place] [address] Dr. [name]  DIAGNOSIS: Invasive ductal carcinoma of the left breast (T1aN0) stage I  PRIOR THERAPY: 1. S/p Lumpectomy with sentinel node biopsy with the final showing an ER+ T1aN0 invasive ductal carcinoma with DCIS  #2 patient is now status post radiation therapy completed on 01/21/2012.  #3 eventually she will begin antiestrogen therapy consisting of tamoxifen 20 mg daily  CURRENT THERAPY: Tamoxifen 20 mg daily to begin today 02/24/2012  INTERVAL HISTORY: [name] 66 y.o. female returns for follow up visit. Overall she is doing well. She's been having a little bit of nausea in the morning. She otherwise denies aches pains fevers chills night sweats. She does have her hot flashes. She has started acupuncture. She is also on Effexor she does have a little bit of postnasal drip. Her husband is sick at home and question is whether she may be getting a viral illness. We discussed starting tamoxifen instead of arimidex. She understands the risks and benefits. Remainder of the 10 point review of systems is negative. MEDICAL HISTORY: Past Medical History  Diagnosis Date  . IBS (irritable bowel syndrome)   . Arthritis   . Breast cancer   . GERD (gastroesophageal reflux disease)   . H/O colonoscopy   . H/O bone density study   . Night sweats   . Sinus complaint   . Heartburn   . Psoriasis   . Anxiety   . Depression   . Hot flashes   . Dysrhythmia     palpitations  . Hypertension     ALLERGIES:   has no known allergies.  MEDICATIONS:  Current Outpatient Prescriptions  Medication Sig Dispense Refill  . BIOTIN PO Take 550 mg by mouth.      . calcium carbonate 200 MG capsule Take 250 mg by mouth 2 (two) times daily with a meal.      . co-enzyme Q-10 30 MG capsule Take 300 mg by mouth daily.      . diclofenac sodium (VOLTAREN) 1 % GEL Apply 1 application topically.      Marland Kitchen  emollient (BIAFINE) cream Apply topically as needed.      . ezetimibe (ZETIA) 10 MG tablet Take 10 mg by mouth daily.      Marland Kitchen ibuprofen (ADVIL,MOTRIN) 200 MG tablet Take 200 mg by mouth every 6 (six) hours as needed.      Marland Kitchen Linaclotide (LINZESS) 145 MCG CAPS Take by mouth daily.      . Multiple Vitamin (MULTIVITAMIN) tablet Take 1 tablet by mouth daily.      . non-metallic deodorant Thornton Papas) MISC Apply 1 application topically daily as needed.      . Omega-3 Fatty Acids (FISH OIL PO) Take 1 each by mouth.      Marland Kitchen omeprazole (PRILOSEC) 20 MG capsule Take 20 mg by mouth daily.      . quinapril-hydrochlorothiazide (ACCURETIC) 10-12.5 MG per tablet Take 1 tablet by mouth daily.      Marland Kitchen tretinoin (RETIN-A) 0.05 % cream Apply topically at bedtime.      Marland Kitchen venlafaxine (EFFEXOR) 37.5 MG tablet 37.5 mg. Take 1-2 tablets daily or as directed,   Just doubled dose today to 75mg , total daily      . Wound Cleansers (RADIAPLEX EX) Apply topically.      . gabapentin (NEURONTIN) 100 MG capsule Take 100 mg by mouth as needed.      . rifaximin (  XIFAXAN) 550 MG TABS Take 550 mg by mouth.        SURGICAL HISTORY:  Past Surgical History  Procedure Date  . Cataract extraction   . Tonsillectomy and adenoidectomy   . Dilation and curettage of uterus   . Abdominal hysterectomy   . Hand surgery     RSD  . Colonoscopy   . Upper gastrointestinal endoscopy   . Breast surgery 10/18/2011    LT br lumpectomy    REVIEW OF SYSTEMS:  A comprehensive review of systems was negative.   PHYSICAL EXAMINATION: General appearance: alert, cooperative and appears stated age Lymph nodes: Cervical, supraclavicular, and axillary nodes normal. Resp: clear to auscultation bilaterally Cardio: regular rate and rhythm GI: soft, non-tender; bowel sounds normal; no masses,  no organomegaly Extremities: extremities normal, atraumatic, no cyanosis or edema Neurologic: Grossly normal Left breat incision healing well  ECOG PERFORMANCE  STATUS: 0 - Asymptomatic  Blood pressure 139/72, pulse 78, temperature 97.9 F (36.6 C), temperature source Oral, resp. rate 20, height 5\' 4"  (1.626 m), weight 138 lb (62.596 kg).  LABORATORY DATA: Lab Results  Component Value Date   WBC 6.7 10/02/2011   HGB 13.0 10/18/2011   HCT 40.4 10/02/2011   MCV 94.1 10/02/2011   PLT 293 10/02/2011      Chemistry      Component Value Date/Time   NA 137 10/17/2011 1502   K 3.2* 10/17/2011 1502   CL 98 10/17/2011 1502   CO2 29 10/17/2011 1502   BUN 11 10/17/2011 1502   CREATININE 0.73 10/17/2011 1502      Component Value Date/Time   CALCIUM 9.2 10/17/2011 1502   ALKPHOS 73 10/02/2011 1603   AST 15 10/02/2011 1603   ALT 10 10/02/2011 1603   BILITOT 0.2* 10/02/2011 1603     ADDITIONAL INFORMATION: PROGNOSTIC INDICATORS - ACIS Results There is insufficient carcinoma present for analysis. All controls stained appropriately Pecola Leisure MD Pathologist, Electronic Signature ( Signed 10/02/2011) CHROMOGENIC IN-SITU HYBRIDIZATION Interpretation: There is insufficient carcinoma present for Her2 analysis. Reference range: Ratio: HER2:CEP17 < 1.8 - gene amplification not observed Ratio: HER2:CEP 17 1.8-2.2 - equivocal result Ratio: HER2:CEP17 > 2.2 - gene amplification observed Pecola Leisure MD Pathologist, Electronic Signature ( Signed 10/01/2011) FINAL DIAGNOSIS 1 of 3 FINAL for Cameron, Catherine ([id number]) Diagnosis Breast, left, needle core biopsy, UOQ - INVASIVE DUCTAL CARCINOMA WITH MUCINOUS FEATURES. - ATYPICAL HYPERPLASIA WITH ASSOCIATED CALCIFICATION. - FIBROCYSTIC CHANGES WITH ASSOCIATED CALCIFICATION. - SEE COMMENT. Microscopic Comment Although definitive grading of breast carcinoma is best done on excision, the features of the tumor from the left upper outer quadrant needle biopsy are compatible with a grade I breast carcinoma with mucinous features. Very little tumor is present on histologic examination. A breast prognostic profile will be  attempted, although, the paucity of tumor may be quantitatively insufficient. The findings are called to [facility] on 09/26/11. Both Dr. Colonel Bald and Dr. Frederica Kuster have seen this case in consultation with agreement that there is invasive ductal carcinoma present. (RAH:gt, 09/26/11) Zandra Abts MD Pathologist, Electronic Signature (Case signed 09/26/2011) Specimen G  ADDITIONAL INFORMATION: 3. PROGNOSTIC INDICATORS - ACIS Results IMMUNOHISTOCHEMICAL AND MORPHOMETRIC ANALYSIS BY THE AUTOMATED CELLULAR IMAGING SYSTEM (ACIS) Estrogen Receptor (Negative, <1%): 99%, STRONG STAINING INTENSITY Progesterone Receptor (Negative, <1%): 100%, STRONG STAINING INTENSIT Y All controls stained appropriately Pecola Leisure MD Pathologist, Electronic Signature ( Signed 10/23/2011) FINAL DIAGNOSIS Diagnosis 1. Lymph node, sentinel, biopsy, Left #1 - THERE IS NO EVIDENCE OF CARCINOMA IN  1 OF 1 LYMPH NODE (0/1). 2. Lymph node, sentinel, biopsy, Left #2 - THERE IS NO EVIDENCE OF CARCINOMA IN 1 OF 1 LYMPH NODE (0/1). 3. Breast, lumpectomy, Left - DUCTAL CARCINOMA IN SITU WITH ASSOCIATED MUCIN, LOW GRADE, SPANNING 1.2 CM. - THE SURGICAL RESECTION MARGINS ARE NEGATIVE FOR DUCTAL CARCINOMA. - SEE ONCOLOGY TABLE BELOW. Microscopic Comment 3. BREAST, INVASIVE TUMOR, WITH LYMPH NODE SAMPLING (Including data from patient's previous core biopsy, [id number]) 1 of 3 FINAL for Fleener, Anda ([id number]) Microscopic Comment(continued) Specimen, including laterality: Left breast Procedure: Needle localized lumpectomy Grade: I Tubule formation: 1 Nuclear pleomorphism: 1 Mitotic: 1 Tumor size (glass slide measurement): less than 0.1 cm Margins: Negative for carcinoma Invasive, distance to closest margin: Greater than 0.2 cm to all margins In-situ, distance to closest margin: Greater than 0.2 cm to all margins Lymphovascular invasion: Not identified Ductal carcinoma in situ: Present Grade:  Low grade Extensive intraductal component: Yes Lobular neoplasia: Not identified Tumor focality: Unifocal Treatment effect: N/A Extent of tumor: Confined to breast parenchyma Lymph nodes: # examined: 2 Lymph nodes with metastasis: 0 Breast prognostic profile: Estrogen and progesterone receptor studies will be performed on the current case and the results reported separately. Non-neoplastic breast: Healing biopsy site TNM: pT32mi, pN0 Comments: The current specimen contains low grade ductal carcinoma in situ associated with extracellular mucin. In addition, there are scattered acellular pools of mucin. However, no carcinoma cell are identified within these pools The size and grade of the invasive component are based on the patient's previous core biopsy. Of note, the entire localized area of the current specimen has been submitted for histologic evaluation. (JBK:kh 10-21-11)  RADIOGRAPHIC STUDIES:  Nm Sentinel Node Inj-no Rpt (breast)  10/18/2011  CLINICAL DATA: left breast cancer   Sulfur colloid was injected intradermally by the nuclear medicine  technologist for breast cancer sentinel node localization.     Mm Breast Surgical Specimen  10/18/2011  *RADIOLOGY REPORT*  Clinical Data:  Mucinous carcinoma, left breast.  NEEDLE LOCALIZATION WITH MAMMOGRAPHIC GUIDANCE AND SPECIMEN RADIOGRAPH  Comparison:  Previous exams  Patient presents for needle localization prior to left lumpectomy. I met with the patient and we discussed the procedure of needle localization including benefits and alternatives. We discussed the high likelihood of a successful procedure. We discussed the risks of the procedure, including infection, bleeding, tissue injury, and further surgery. Informed, written consent was given.  Using mammographic guidance, sterile technique, 2% lidocaine and a 7 cm modified Kopans needle, the clip localized using a lateral approach.  The films are marked for Dr. Carolynne Edouard.  Specimen radiograph was  performed [facility], and confirms the clip, residual calcifications and wire are present in the tissue sample.  The specimen is marked for pathology.  IMPRESSION: Needle localization left breast.  No apparent complications.  Original Report Authenticated By: Hiram Gash, M.D.   Mm Breast Wire Localization Left  10/18/2011  *RADIOLOGY REPORT*  Clinical Data:  Mucinous carcinoma, left breast.  NEEDLE LOCALIZATION WITH MAMMOGRAPHIC GUIDANCE AND SPECIMEN RADIOGRAPH  Comparison:  Previous exams  Patient presents for needle localization prior to left lumpectomy. I met with the patient and we discussed the procedure of needle localization including benefits and alternatives. We discussed the high likelihood of a successful procedure. We discussed the risks of the procedure, including infection, bleeding, tissue injury, and further surgery. Informed, written consent was given.  Using mammographic guidance, sterile technique, 2% lidocaine and a 7 cm modified Kopans needle, the clip localized using a lateral  approach.  The films are marked for Dr. Carolynne Edouard.  Specimen radiograph was performed at [facility], and confirms the clip, residual calcifications and wire are present in the tissue sample.  The specimen is marked for pathology.  IMPRESSION: Needle localization left breast.  No apparent complications.  Original Report Authenticated By: Hiram Gash, M.D.    ASSESSMENT: 66 year old female with:  #1 invasive ductal carcinoma of the left breast s/p left lumpectomy. The final path showed minimla focus of invasion with the remaining area primarily DCIS, ER+ PR+.  #2 patient has now completed radiation therapy as of November of 5 2013.  #3 patient will now begin tamoxifen 20 mg daily adjuvantly for stage I breast cancer. She understands the rationale risks and benefits.  PLAN:  #1 patient will proceed with tamoxifen 20 mg daily. Risks and benefits of tamoxifen were discussed with her clearly.  #2  she is taking Effexor and that seems to be helping. She will also begin acupuncture to see if that helps with her hot flashes.  #3 she will return in 3 months time for followup or sooner if need arises.  All questions were answered. The patient knows to call the clinic with any problems, questions or concerns. We can certainly see the patient much sooner if necessary.  I spent 25 minutes counseling the patient face to face. The total time spent in the appointment was 30 minutes.    Drue Second, MD Medical/Oncology [facility] [phone] (beeper) [phone] (Office)  02/24/2012, 9:34 AM

## 2012-02-24 NOTE — Telephone Encounter (Signed)
Gave patient appointment for 06-08-2012 starting at 2:00pm

## 2012-02-28 ENCOUNTER — Ambulatory Visit: Payer: BC Managed Care – PPO | Admitting: Oncology

## 2012-02-28 ENCOUNTER — Other Ambulatory Visit: Payer: BC Managed Care – PPO | Admitting: Lab

## 2012-02-28 ENCOUNTER — Ambulatory Visit: Payer: BC Managed Care – PPO | Admitting: Radiation Oncology

## 2012-03-05 ENCOUNTER — Telehealth: Payer: Self-pay | Admitting: *Deleted

## 2012-03-05 NOTE — Telephone Encounter (Signed)
Patient called asking if there is any problem with her having an ingrown toenail looked at today.  En route to see doctor about toenail and just remembered she was told during radiation with the lymphedema not to do anything done to her cuticles.  No problem with lymphedema for several weeks.  Denies any swelling to feet.  Currently taking tamoxifen and effexor.  Completed RT three weeks ago.  Informed her the ingrown toenail may be treated as needed.

## 2012-03-06 ENCOUNTER — Ambulatory Visit
Admission: RE | Admit: 2012-03-06 | Discharge: 2012-03-06 | Disposition: A | Discharge status: Home / Self Care | Payer: BC Managed Care – PPO | Source: Ambulatory Visit | Attending: Radiation Oncology | Admitting: Radiation Oncology

## 2012-03-06 VITALS — BP 141/64 | HR 79 | Temp 98.6°F | Wt 136.6 lb

## 2012-03-06 DIAGNOSIS — C50419 Malignant neoplasm of upper-outer quadrant of unspecified female breast: Secondary | ICD-10-CM

## 2012-03-06 NOTE — Progress Notes (Signed)
Department of Radiation Oncology  Phone:  [phone] Fax:        [phone]   Name: [name]   DOB: 1946-01-07  MRN: [id number]    Date: 03/06/2012  Follow Up Visit Note  Diagnosis: T1a N0 Invasive left breast cancer  Interval since last radiation: 6 weeks  Interval History: [name] presents today for routine followup.  She is feeling well and doing well tamoxifen about a week ago and has a little bit and nausea. She's not sure that we can cause by the tamoxifen. She feels very blessed and is ready to move on. She is called about setting up for the live strong program and is interested in our finding her new normal survivorship program. She has appointment Dr. Welton Flakes in March.  Allergies: No Known Allergies  Medications:  Current Outpatient Prescriptions  Medication Sig Dispense Refill  . BIOTIN PO Take 550 mg by mouth.      . calcium carbonate 200 MG capsule Take 250 mg by mouth 2 (two) times daily with a meal.      . co-enzyme Q-10 30 MG capsule Take 300 mg by mouth daily.      . diclofenac sodium (VOLTAREN) 1 % GEL Apply 1 application topically.      Marland Kitchen emollient (BIAFINE) cream Apply topically as needed.      . ezetimibe (ZETIA) 10 MG tablet Take 10 mg by mouth daily.      Marland Kitchen gabapentin (NEURONTIN) 100 MG capsule Take 100 mg by mouth as needed.      Marland Kitchen ibuprofen (ADVIL,MOTRIN) 200 MG tablet Take 200 mg by mouth every 6 (six) hours as needed.      Marland Kitchen Linaclotide (LINZESS) 145 MCG CAPS Take by mouth daily.      . Multiple Vitamin (MULTIVITAMIN) tablet Take 1 tablet by mouth daily.      . non-metallic deodorant Thornton Papas) MISC Apply 1 application topically daily as needed.      . Omega-3 Fatty Acids (FISH OIL PO) Take 1 each by mouth.      Marland Kitchen omeprazole (PRILOSEC) 20 MG capsule Take 20 mg by mouth daily.      . quinapril-hydrochlorothiazide (ACCURETIC) 10-12.5 MG per tablet Take 1 tablet by mouth daily.      . rifaximin (XIFAXAN) 550 MG TABS Take 550 mg by mouth.      .  tamoxifen (NOLVADEX) 20 MG tablet Take 1 tablet (20 mg total) by mouth daily.  90 tablet  12  . tretinoin (RETIN-A) 0.05 % cream Apply topically at bedtime.      Marland Kitchen venlafaxine (EFFEXOR) 37.5 MG tablet 37.5 mg. Take 1-2 tablets daily or as directed,   Just doubled dose today to 75mg , total daily      . Wound Cleansers (RADIAPLEX EX) Apply topically.        Physical Exam:   weight is 136 lb 9.6 oz (61.961 kg). Her temperature is 98.6 F (37 C). Her blood pressure is 141/64 and her pulse is 79.  She has a good cosmetic outcome. She has some faint hyperpigmentation laterally. Her nipple is slightly everted on the left side. She has fairly good symmetry.  IMPRESSION: [name] is a 66 y.o. female status post breast conservation for very early stage left breast cancer with resolving acute effects of treatment  PLAN:  [name] looks great. I will plan on seeing her back in about 6 months just to make sure she is doing okay. She will continue her lotion. We discussed  sun protection. She will continue on her tamoxifen. She knows to contact us with any questions or concerns. I think she's on the way to survivorship and is plugged in to the right programs.  [name], MD

## 2012-03-06 NOTE — Progress Notes (Signed)
Patient here for routine one month follow up completion of left breast radiation.Tolerated well.Redness diminished, no peeling.Started tamoxifen one week ago without side effects except for some nausea with out known cause.Appetite good.Taking effexor for hot flashes.

## 2012-03-16 ENCOUNTER — Ambulatory Visit (INDEPENDENT_AMBULATORY_CARE_PROVIDER_SITE_OTHER): Payer: BC Managed Care – PPO | Admitting: Licensed Clinical Social Worker

## 2012-03-16 DIAGNOSIS — F331 Major depressive disorder, recurrent, moderate: Secondary | ICD-10-CM

## 2012-04-08 ENCOUNTER — Encounter: Payer: Self-pay | Admitting: *Deleted

## 2012-04-08 NOTE — Progress Notes (Signed)
Mailed [employer] paperwork to pt.

## 2012-04-09 ENCOUNTER — Telehealth: Payer: Self-pay | Admitting: Medical Oncology

## 2012-04-09 NOTE — Telephone Encounter (Signed)
Patient called requesting to know if [employer] form had been faxed to [facility], informed pt per note in chart that it was mailed to her as of 04/08/12, patient stated she needed to have the form faxed and would have [employer] fax over to our office another form for [place] to fax.

## 2012-04-28 ENCOUNTER — Encounter (INDEPENDENT_AMBULATORY_CARE_PROVIDER_SITE_OTHER): Payer: Self-pay | Admitting: General Surgery

## 2012-04-28 ENCOUNTER — Ambulatory Visit (INDEPENDENT_AMBULATORY_CARE_PROVIDER_SITE_OTHER): Payer: BC Managed Care – PPO | Admitting: General Surgery

## 2012-04-28 VITALS — BP 132/62 | HR 68 | Temp 97.0°F | Resp 16 | Ht 64.0 in | Wt 140.0 lb

## 2012-04-28 DIAGNOSIS — C50419 Malignant neoplasm of upper-outer quadrant of unspecified female breast: Secondary | ICD-10-CM

## 2012-04-28 NOTE — Patient Instructions (Signed)
Continue regular self exams Continue tamoxifen 

## 2012-04-28 NOTE — Progress Notes (Signed)
Subjective:     Patient ID: [name], female   DOB: 1945-04-23, 67 y.o.   MRN: [id number]  HPI The patient is a 67 year old white female who is 6 months status post left breast lumpectomy and negative sentinel node biopsy for a T1 microscopic N0 left breast cancer. Since her last visit she has completed radiation therapy and is started taking tamoxifen. She is having a lot of problems with the hot flashes and really does not like taking the tamoxifen. She is also felt some soreness along the lumpectomy incision that she is concerned about. She denies any discharge from her nipple. She is scheduled for her next mammogram in about 3 months to  Review of Systems  Constitutional: Negative.   HENT: Negative.   Eyes: Negative.   Respiratory: Negative.   Cardiovascular: Negative.   Gastrointestinal: Negative.   Endocrine: Negative.   Genitourinary: Negative.   Musculoskeletal: Negative.   Skin: Negative.   Allergic/Immunologic: Negative.   Neurological: Negative.   Hematological: Negative.   Psychiatric/Behavioral: Negative.        Objective:   Physical Exam  Constitutional: She is oriented to person, place, and time. She appears well-developed and well-nourished.  HENT:  Head: Normocephalic and atraumatic.  Eyes: Conjunctivae and EOM are normal. Pupils are equal, round, and reactive to light.  Neck: Normal range of motion. Neck supple.  Cardiovascular: Normal rate, regular rhythm and normal heart sounds.   Pulmonary/Chest: Effort normal and breath sounds normal.  She has a thickened scar in the upper outer left breast secondary to radiation. Otherwise there is no palpable mass in either breast. There is no palpable axillary supraclavicular cervical lymphadenopathy.  Abdominal: Soft. Bowel sounds are normal. She exhibits no mass. There is no tenderness.  Musculoskeletal: Normal range of motion.  Lymphadenopathy:    She has no cervical adenopathy.  Neurological: She is alert and  oriented to person, place, and time.  Skin: Skin is warm and dry.  Psychiatric: She has a normal mood and affect. Her behavior is normal.       Assessment:     The patient is 6 months status post left breast lumpectomy for a left breast cancer     Plan:     At this point she will continue to do regular self exams. She will continue to take tamoxifen. She will talk to her oncologist about other strategies to lessen the hot flashes. We will plan to see her back in about 3 months.

## 2012-04-29 ENCOUNTER — Other Ambulatory Visit: Payer: Self-pay | Admitting: *Deleted

## 2012-04-29 MED ORDER — VENLAFAXINE HCL 37.5 MG PO TABS: ORAL_TABLET | ORAL | Status: DC

## 2012-05-06 ENCOUNTER — Ambulatory Visit (INDEPENDENT_AMBULATORY_CARE_PROVIDER_SITE_OTHER): Payer: Self-pay | Admitting: General Surgery

## 2012-05-28 ENCOUNTER — Encounter: Payer: Self-pay | Admitting: Radiation Oncology

## 2012-05-28 DIAGNOSIS — C50919 Malignant neoplasm of unspecified site of unspecified female breast: Secondary | ICD-10-CM | POA: Insufficient documentation

## 2012-05-28 DIAGNOSIS — Z923 Personal history of irradiation: Secondary | ICD-10-CM | POA: Insufficient documentation

## 2012-06-04 ENCOUNTER — Ambulatory Visit: Payer: BC Managed Care – PPO | Admitting: Radiation Oncology

## 2012-06-08 ENCOUNTER — Other Ambulatory Visit (HOSPITAL_BASED_OUTPATIENT_CLINIC_OR_DEPARTMENT_OTHER): Payer: BC Managed Care – PPO | Admitting: Lab

## 2012-06-08 ENCOUNTER — Ambulatory Visit (HOSPITAL_BASED_OUTPATIENT_CLINIC_OR_DEPARTMENT_OTHER): Payer: BC Managed Care – PPO | Admitting: Adult Health

## 2012-06-08 ENCOUNTER — Telehealth: Payer: Self-pay | Admitting: *Deleted

## 2012-06-08 ENCOUNTER — Encounter: Payer: Self-pay | Admitting: Adult Health

## 2012-06-08 VITALS — BP 128/69 | HR 76 | Temp 97.9°F | Resp 20 | Ht 64.0 in | Wt 138.5 lb

## 2012-06-08 DIAGNOSIS — C50419 Malignant neoplasm of upper-outer quadrant of unspecified female breast: Secondary | ICD-10-CM

## 2012-06-08 DIAGNOSIS — N951 Menopausal and female climacteric states: Secondary | ICD-10-CM

## 2012-06-08 DIAGNOSIS — R232 Flushing: Secondary | ICD-10-CM

## 2012-06-08 DIAGNOSIS — C50412 Malignant neoplasm of upper-outer quadrant of left female breast: Secondary | ICD-10-CM

## 2012-06-08 DIAGNOSIS — Z170 Estrogen receptor positive status [ER+]: Secondary | ICD-10-CM

## 2012-06-08 LAB — COMPREHENSIVE METABOLIC PANEL (CC13)
ALT: 10 U/L (ref 0–55)
AST: 13 U/L (ref 5–34)
Albumin: 3.4 g/dL — ABNORMAL LOW (ref 3.5–5.0)
BUN: 13.1 mg/dL (ref 7.0–26.0)
Calcium: 9 mg/dL (ref 8.4–10.4)
Chloride: 103 mEq/L (ref 98–107)
Potassium: 3.9 mEq/L (ref 3.5–5.1)

## 2012-06-08 LAB — CBC WITH DIFFERENTIAL/PLATELET
BASO%: 1 % (ref 0.0–2.0)
Basophils Absolute: 0.1 10*3/uL (ref 0.0–0.1)
EOS%: 4.4 % (ref 0.0–7.0)
HGB: 13.1 g/dL (ref 11.6–15.9)
MCH: 32.3 pg (ref 25.1–34.0)
RDW: 13.3 % (ref 11.2–14.5)
WBC: 5.5 10*3/uL (ref 3.9–10.3)
lymph#: 1.3 10*3/uL (ref 0.9–3.3)

## 2012-06-08 MED ORDER — GABAPENTIN 300 MG PO CAPS: 300.0000 mg | ORAL_CAPSULE | Freq: Every day | ORAL | Status: DC

## 2012-06-08 MED ORDER — VENLAFAXINE HCL ER 75 MG PO CP24: 75.0000 mg | ORAL_CAPSULE | Freq: Every day | ORAL | Status: DC

## 2012-06-08 NOTE — Progress Notes (Signed)
OFFICE PROGRESS NOTE  CC  [name] [address] [place] [address] Dr. Lurline Cameron  DIAGNOSIS: Invasive ductal carcinoma of the left breast (T1aN0) stage I  PRIOR THERAPY: 1. S/p Lumpectomy with sentinel node biopsy with the final showing an ER+ T1aN0 invasive ductal carcinoma with DCIS  #2 patient is now status post radiation therapy completed on 01/21/2012.  #3 eventually she will begin antiestrogen therapy consisting of tamoxifen 20 mg daily  CURRENT THERAPY: Tamoxifen 20 mg daily to begin today 02/24/2012  INTERVAL HISTORY: [name] 67 y.o. female returns for follow up visit.  She is having a difficult time with hot flashes and would like another suggestion for anything to take to help.  She is fatigued, and having a difficult time dealing with her husband regarding the level of activity she is able to do per day.  She has been going to [facility] and wants to know if there are any free counseling sessions she can partake in.  She has occasional leg cramps, and nausea, that's relieved with food.  She also has occasional difficulty breathing that occurs every few days, and lasts a breath long.    MEDICAL HISTORY: Past Medical History  Diagnosis Date  . IBS (irritable bowel syndrome)   . Arthritis   . Breast cancer 10/18/2011    dcis, left breast, ER/PR +  . GERD (gastroesophageal reflux disease)   . H/O colonoscopy   . H/O bone density study   . Night sweats   . Sinus complaint   . Heartburn   . Psoriasis   . Anxiety   . Depression   . Hot flashes   . Dysrhythmia     palpitations  . Hypertension   . History of radiation therapy 12/24/11 -01/21/12    left breast    ALLERGIES:  has No Known Allergies.  MEDICATIONS:  Current Outpatient Prescriptions  Medication Sig Dispense Refill  . BIOTIN PO Take 550 mg by mouth.      . ezetimibe (ZETIA) 10 MG tablet Take 10 mg by mouth daily.      Marland Kitchen ibuprofen (ADVIL,MOTRIN) 200 MG tablet Take 200 mg by  mouth every 6 (six) hours as needed.      Marland Kitchen Linaclotide (LINZESS) 145 MCG CAPS Take by mouth daily.      . Multiple Vitamin (MULTIVITAMIN) tablet Take 1 tablet by mouth daily.      . non-metallic deodorant Thornton Papas) MISC Apply 1 application topically daily as needed.      . Omega-3 Fatty Acids (FISH OIL PO) Take 1 each by mouth.      Marland Kitchen omeprazole (PRILOSEC) 20 MG capsule Take 20 mg by mouth daily.      . quinapril-hydrochlorothiazide (ACCURETIC) 10-12.5 MG per tablet Take 1 tablet by mouth daily.      . rifaximin (XIFAXAN) 550 MG TABS Take 550 mg by mouth.      . tamoxifen (NOLVADEX) 20 MG tablet Take 1 tablet (20 mg total) by mouth daily.  90 tablet  12  . tretinoin (RETIN-A) 0.05 % cream Apply topically at bedtime.      Marland Kitchen venlafaxine (EFFEXOR) 37.5 MG tablet Take 1-2 tablets daily or as directed.  60 tablet  1  . calcium carbonate 200 MG capsule Take 250 mg by mouth 2 (two) times daily with a meal.      . co-enzyme Q-10 30 MG capsule Take 300 mg by mouth daily.      . diclofenac sodium (VOLTAREN)  1 % GEL Apply 1 application topically.      . gabapentin (NEURONTIN) 100 MG capsule Take 100 mg by mouth as needed.       No current facility-administered medications for this visit.    SURGICAL HISTORY:  Past Surgical History  Procedure Laterality Date  . Cataract extraction    . Tonsillectomy and adenoidectomy    . Dilation and curettage of uterus    . Abdominal hysterectomy    . Hand surgery      RSD  . Colonoscopy    . Upper gastrointestinal endoscopy    . Breast surgery  10/18/2011    LT br lumpectomy    REVIEW OF SYSTEMS:  A comprehensive review of systems was negative.   Health Maintenance  Mammogram: 09/09/11 Colonoscopy: 5 years ago with 5 year f/u recommended Bone Density Scan: 09/06/10 Pap Smear: s/p TAH, unsure if BSO or not Eye Exam:  08/2011  Vitamin D Level: next appt Lipid Panel: 08/2011   PHYSICAL EXAMINATION: BP 128/69  Pulse 76  Temp(Src) 97.9 F (36.6 C)  (Oral)  Resp 20  Ht 5\' 4"  (1.626 m)  Wt 138 lb 8 oz (62.823 kg)  BMI 23.76 kg/m2  General appearance: alert, cooperative and appears stated age Lymph nodes: Cervical, supraclavicular, and axillary nodes normal. Resp: clear to auscultation bilaterally Cardio: regular rate and rhythm GI: soft, non-tender; bowel sounds normal; no masses,  no organomegaly Extremities: extremities normal, atraumatic, no cyanosis or edema Neurologic: Grossly normal Left breat incision healing well ECOG PERFORMANCE STATUS: 0 - Asymptomatic    LABORATORY DATA: Lab Results  Component Value Date   WBC 5.5 06/08/2012   HGB 13.1 06/08/2012   HCT 38.3 06/08/2012   MCV 94.2 06/08/2012   PLT 250 06/08/2012      Chemistry      Component Value Date/Time   NA 140 06/08/2012 1407   NA 137 10/17/2011 1502   K 3.9 06/08/2012 1407   K 3.2* 10/17/2011 1502   CL 103 06/08/2012 1407   CL 98 10/17/2011 1502   CO2 28 06/08/2012 1407   CO2 29 10/17/2011 1502   BUN 13.1 06/08/2012 1407   BUN 11 10/17/2011 1502   CREATININE 0.8 06/08/2012 1407   CREATININE 0.73 10/17/2011 1502      Component Value Date/Time   CALCIUM 9.0 06/08/2012 1407   CALCIUM 9.2 10/17/2011 1502   ALKPHOS 59 06/08/2012 1407   ALKPHOS 73 10/02/2011 1603   AST 13 06/08/2012 1407   AST 15 10/02/2011 1603   ALT 10 06/08/2012 1407   ALT 10 10/02/2011 1603   BILITOT 0.24 06/08/2012 1407   BILITOT 0.2* 10/02/2011 1603     ADDITIONAL INFORMATION: PROGNOSTIC INDICATORS - ACIS Results There is insufficient carcinoma present for analysis. All controls stained appropriately Catherine Leisure MD Pathologist, Electronic Signature ( Signed 10/02/2011) CHROMOGENIC IN-SITU HYBRIDIZATION Interpretation: There is insufficient carcinoma present for Her2 analysis. Reference range: Ratio: HER2:CEP17 < 1.8 - gene amplification not observed Ratio: HER2:CEP 17 1.8-2.2 - equivocal result Ratio: HER2:CEP17 > 2.2 - gene amplification observed Catherine Leisure MD Pathologist, Electronic  Signature ( Signed 10/01/2011) FINAL DIAGNOSIS 1 of 3 FINAL for [name] ([id number]) Diagnosis Breast, left, needle core biopsy, UOQ - INVASIVE DUCTAL CARCINOMA WITH MUCINOUS FEATURES. - ATYPICAL HYPERPLASIA WITH ASSOCIATED CALCIFICATION. - FIBROCYSTIC CHANGES WITH ASSOCIATED CALCIFICATION. - SEE COMMENT. Microscopic Comment Although definitive grading of breast carcinoma is best done on excision, the features of the tumor from the left upper outer quadrant needle  biopsy are compatible with a grade I breast carcinoma with mucinous features. Very little tumor is present on histologic examination. A breast prognostic profile will be attempted, although, the paucity of tumor may be quantitatively insufficient. The findings are called to [facility] on 09/26/11. Both Dr. Colonel Bald and Dr. Frederica Kuster have seen this case in consultation with agreement that there is invasive ductal carcinoma present. (RAH:gt, 09/26/11) Zandra Abts MD Pathologist, Electronic Signature (Case signed 09/26/2011) Specimen G  ADDITIONAL INFORMATION: 3. PROGNOSTIC INDICATORS - ACIS Results IMMUNOHISTOCHEMICAL AND MORPHOMETRIC ANALYSIS BY THE AUTOMATED CELLULAR IMAGING SYSTEM (ACIS) Estrogen Receptor (Negative, <1%): 99%, STRONG STAINING INTENSITY Progesterone Receptor (Negative, <1%): 100%, STRONG STAINING INTENSIT Y All controls stained appropriately Catherine Leisure MD Pathologist, Electronic Signature ( Signed 10/23/2011) FINAL DIAGNOSIS Diagnosis 1. Lymph node, sentinel, biopsy, Left #1 - THERE IS NO EVIDENCE OF CARCINOMA IN 1 OF 1 LYMPH NODE (0/1). 2. Lymph node, sentinel, biopsy, Left #2 - THERE IS NO EVIDENCE OF CARCINOMA IN 1 OF 1 LYMPH NODE (0/1). 3. Breast, lumpectomy, Left - DUCTAL CARCINOMA IN SITU WITH ASSOCIATED MUCIN, LOW GRADE, SPANNING 1.2 CM. - THE SURGICAL RESECTION MARGINS ARE NEGATIVE FOR DUCTAL CARCINOMA. - SEE ONCOLOGY TABLE BELOW. Microscopic Comment 3. BREAST,  INVASIVE TUMOR, WITH LYMPH NODE SAMPLING (Including data from patient's previous core biopsy, [id number]) 1 of 3 FINAL for Dayhoff, Larose ([id number]) Microscopic Comment(continued) Specimen, including laterality: Left breast Procedure: Needle localized lumpectomy Grade: I Tubule formation: 1 Nuclear pleomorphism: 1 Mitotic: 1 Tumor size (glass slide measurement): less than 0.1 cm Margins: Negative for carcinoma Invasive, distance to closest margin: Greater than 0.2 cm to all margins In-situ, distance to closest margin: Greater than 0.2 cm to all margins Lymphovascular invasion: Not identified Ductal carcinoma in situ: Present Grade: Low grade Extensive intraductal component: Yes Lobular neoplasia: Not identified Tumor focality: Unifocal Treatment effect: N/A Extent of tumor: Confined to breast parenchyma Lymph nodes: # examined: 2 Lymph nodes with metastasis: 0 Breast prognostic profile: Estrogen and progesterone receptor studies will be performed on the current case and the results reported separately. Non-neoplastic breast: Healing biopsy site TNM: [id number], pN0 Comments: The current specimen contains low grade ductal carcinoma in situ associated with extracellular mucin. In addition, there are scattered acellular pools of mucin. However, no carcinoma cell are identified within these pools The size and grade of the invasive component are based on the patient's previous core biopsy. Of note, the entire localized area of the current specimen has been submitted for histologic evaluation. (JBK:kh 10-21-11)  RADIOGRAPHIC STUDIES:  Nm Sentinel Node Inj-no Rpt (breast)  10/18/2011  CLINICAL DATA: left breast cancer   Sulfur colloid was injected intradermally by the nuclear medicine  technologist for breast cancer sentinel node localization.     Mm Breast Surgical Specimen  10/18/2011  *RADIOLOGY REPORT*  Clinical Data:  Mucinous carcinoma, left breast.  NEEDLE LOCALIZATION WITH  MAMMOGRAPHIC GUIDANCE AND SPECIMEN RADIOGRAPH  Comparison:  Previous exams  Patient presents for needle localization prior to left lumpectomy. I met with the patient and we discussed the procedure of needle localization including benefits and alternatives. We discussed the high likelihood of a successful procedure. We discussed the risks of the procedure, including infection, bleeding, tissue injury, and further surgery. Informed, written consent was given.  Using mammographic guidance, sterile technique, 2% lidocaine and a 7 cm modified Kopans needle, the clip localized using a lateral approach.  The films are marked for Dr. Carolynne Edouard.  Specimen radiograph was performed [facility], and  confirms the clip, residual calcifications and wire are present in the tissue sample.  The specimen is marked for pathology.  IMPRESSION: Needle localization left breast.  No apparent complications.  Original Report Authenticated By: Hiram Gash, M.D.   Mm Breast Wire Localization Left  10/18/2011  *RADIOLOGY REPORT*  Clinical Data:  Mucinous carcinoma, left breast.  NEEDLE LOCALIZATION WITH MAMMOGRAPHIC GUIDANCE AND SPECIMEN RADIOGRAPH  Comparison:  Previous exams  Patient presents for needle localization prior to left lumpectomy. I met with the patient and we discussed the procedure of needle localization including benefits and alternatives. We discussed the high likelihood of a successful procedure. We discussed the risks of the procedure, including infection, bleeding, tissue injury, and further surgery. Informed, written consent was given.  Using mammographic guidance, sterile technique, 2% lidocaine and a 7 cm modified Kopans needle, the clip localized using a lateral approach.  The films are marked for Dr. Carolynne Edouard.  Specimen radiograph was performed at [facility], and confirms the clip, residual calcifications and wire are present in the tissue sample.  The specimen is marked for pathology.  IMPRESSION: Needle  localization left breast.  No apparent complications.  Original Report Authenticated By: Hiram Gash, M.D.    ASSESSMENT: 67 year old female with:  #1 invasive ductal carcinoma of the left breast s/p left lumpectomy. The final path showed minimla focus of invasion with the remaining area primarily DCIS, ER+ PR+.  #2 patient has now completed radiation therapy as of November of 5 2013.  #3 patient will now begin tamoxifen 20 mg daily adjuvantly for stage I breast cancer. She understands the rationale risks and benefits.  PLAN:  #1 patient will continue with tamoxifen 20 mg daily. Risks and benefits of tamoxifen were discussed with her clearly.  She will have her next mammo in July.    #2  She requested to take daily Effexor, I changed her Effexor to 75mg  XR daily.  I also prescribed Neurontin QHS to help with the hot flashes.  #3 I recommended a doppler of her legs to evaluate the pain.  She declined stating she didn't want to pay for another test.  She wants to try the Neuron tin and Effexor and if they don't help her she will call us.  I did inform of the risk involved with that decision.    #4 she will return in 6 months time for followup or sooner if need arises.  All questions were answered. The patient knows to call the clinic with any problems, questions or concerns. We can certainly see the patient much sooner if necessary.  I spent 25 minutes counseling the patient face to face. The total time spent in the appointment was 30 minutes.  Cherie Ouch Lyn Hollingshead, NP Medical Oncology [facility] Phone: [phone]  06/08/2012, 2:50 PM

## 2012-06-08 NOTE — Patient Instructions (Addendum)
Doing well.  I have prescribed Effexor XR and Gabapentin today.  Take the Gabapentin before bedtime to see if that helps with the hot flashes.  I recommend you have a doppler of your legs to rule out a DVT since you are experiencing some cramping.  Should it worsen please call our office.  If you have any shortness of breath, or feelings like you cannot breathe, please go to the emergency room.  Please call us if you have any questions or concerns.    Gabapentin capsules or tablets What is this medicine? GABAPENTIN (GA ba pen tin) is used to control partial seizures in adults with epilepsy. It is also used to treat certain types of nerve pain. This medicine may be used for other purposes; ask your health care provider or pharmacist if you have questions. What should I tell my health care provider before I take this medicine? They need to know if you have any of these conditions: -kidney disease -suicidal thoughts, plans, or attempt; a previous suicide attempt by you or a family member -an unusual or allergic reaction to gabapentin, other medicines, foods, dyes, or preservatives -pregnant or trying to get pregnant -breast-feeding How should I use this medicine? Take this medicine by mouth. Swallow it with a drink of water. Follow the directions on the prescription label. If this medicine upsets your stomach, take it with food or milk. Take your medicine at regular intervals. Do not take it more often than directed. If you are directed to break the 600 or 800 mg tablets in half as part of your dose, the extra half tablet should be used for the next dose. If you have not used the extra half tablet within 3 days, it should be thrown away. A special MedGuide will be given to you by the pharmacist with each prescription and refill. Be sure to read this information carefully each time. Talk to your pediatrician regarding the use of this medicine in children. Special care may be needed. Overdosage: If you  think you have taken too much of this medicine contact a poison control center or emergency room at once. NOTE: This medicine is only for you. Do not share this medicine with others. What if I miss a dose? If you miss a dose, take it as soon as you can. If it is almost time for your next dose, take only that dose. Do not take double or extra doses. What may interact with this medicine? -antacids -hydrocodone -morphine -naproxen -sevelamer This list may not describe all possible interactions. Give your health care provider a list of all the medicines, herbs, non-prescription drugs, or dietary supplements you use. Also tell them if you smoke, drink alcohol, or use illegal drugs. Some items may interact with your medicine. What should I watch for while using this medicine? Visit your doctor or health care professional for regular checks on your progress. You may want to keep a record at home of how you feel your condition is responding to treatment. You may want to share this information with your doctor or health care professional at each visit. You should contact your doctor or health care professional if your seizures get worse or if you have any new types of seizures. Do not stop taking this medicine or any of your seizure medicines unless instructed by your doctor or health care professional. Stopping your medicine suddenly can increase your seizures or their severity. Wear a medical identification bracelet or chain if you are taking this  medicine for seizures, and carry a card that lists all your medications. You may get drowsy, dizzy, or have blurred vision. Do not drive, use machinery, or do anything that needs mental alertness until you know how this medicine affects you. To reduce dizzy or fainting spells, do not sit or stand up quickly, especially if you are an older patient. Alcohol can increase drowsiness and dizziness. Avoid alcoholic drinks. Your mouth may get dry. Chewing sugarless gum or  sucking hard candy, and drinking plenty of water will help. The use of this medicine may increase the chance of suicidal thoughts or actions. Pay special attention to how you are responding while on this medicine. Any worsening of mood, or thoughts of suicide or dying should be reported to your health care professional right away. Women who become pregnant while using this medicine may enroll in the [place] [employer] Antiepileptic Drug Pregnancy Registry by calling ([phone]. This registry collects information about the safety of antiepileptic drug use during pregnancy. What side effects may I notice from receiving this medicine? Side effects that you should report to your doctor or health care professional as soon as possible: -allergic reactions like skin rash, itching or hives, swelling of the face, lips, or tongue -worsening of mood, thoughts or actions of suicide or dying Side effects that usually do not require medical attention (report to your doctor or health care professional if they continue or are bothersome): -constipation -difficulty walking or controlling muscle movements -nausea -slurred speech -tremors -weight gain This list may not describe all possible side effects. Call your doctor for medical advice about side effects. You may report side effects to FDA at 1-800-FDA-1088. Where should I keep my medicine? Keep out of reach of children. Store at room temperature between 15 and 30 degrees C (59 and 86 degrees F). Throw away any unused medicine after the expiration date. NOTE: This sheet is a summary. It may not cover all possible information. If you have questions about this medicine, talk to your doctor, pharmacist, or health care provider.  2012, Elsevier/Gold Standard. (10/31/2009 6:06:26 PM)  Venlafaxine extended-release capsules What is this medicine? VENLAFAXINE (VEN la fax een) is used to treat depression, anxiety and panic disorder. This medicine may be used for  other purposes; ask your health care provider or pharmacist if you have questions. What should I tell my health care provider before I take this medicine? They need to know if you have any of these conditions: -anorexia or weight loss -glaucoma -high blood pressure, heart problems or a recent heart attack -high cholesterol levels or receiving treatment for high cholesterol -kidney or liver disease -mania or bipolar disorder -seizures (convulsions) -suicidal thoughts or a previous suicide attempt -thyroid problems -an unusual or allergic reaction to venlafaxine, other medicines, foods, dyes, or preservatives -pregnant or trying to get pregnant -breast-feeding How should I use this medicine? Take this medicine by mouth with a full glass of water. Follow the directions on the prescription label. Do not cut, crush or chew this medicine. Take it with food. Try to take your medicine at about the same time each day. Do not take your medicine more often than directed. Do not stop taking except on your doctor's advice. A special MedGuide will be given to you by the pharmacist with each prescription and refill. Be sure to read this information carefully each time. Talk to your pediatrician regarding the use of this medicine in children. Special care may be needed. Overdosage: If you think you have  taken too much of this medicine contact a poison control center or emergency room at once. NOTE: This medicine is only for you. Do not share this medicine with others. What if I miss a dose? If you miss a dose, take it as soon as you can. If it is almost time for your next dose, take only that dose. Do not take double or extra doses. What may interact with this medicine? Do not take this medicine with any of the following medications: -desvenlafaxine -duloxetine -linezolid -MAOIs like Azilect, Carbex, Eldepryl, Marplan, Nardil, and Parnate -medicines for weight control or appetite -methylene  blue -nefazodone -procarbazine -tryptophan This medicine may also interact with the following medications: -amphetamine or dextroamphetamine -aspirin and aspirin-like medicines -cimetidine -clozapine -medicines for heart rhythm or blood pressure -medicines for migraine headache like almotriptan, eletriptan, frovatriptan, naratriptan, rizatriptan, sumatriptan, zolmitriptan -medicines that treat or prevent blood clots like warfarin, enoxaparin, and dalteparin -NSAIDS, medicines for pain and inflammation, like ibuprofen or naproxen -other medicines for depression, anxiety, or psychotic disturbances -ritonavir -St. John's wort -tramadol This list may not describe all possible interactions. Give your health care provider a list of all the medicines, herbs, non-prescription drugs, or dietary supplements you use. Also tell them if you smoke, drink alcohol, or use illegal drugs. Some items may interact with your medicine. What should I watch for while using this medicine? Visit your doctor or health care professional for regular checks on your progress. You may have to take this medicine for 4 weeks before you feel better. If you have been taking this medicine for some time, do not suddenly stop taking it. You must gradually reduce the dose to avoid side effects. Ask your doctor or health care professional for advice. Patients and their families should watch out for depression or thoughts of suicide that get worse. Also watch out for sudden or severe changes in feelings such as feeling anxious, agitated, panicky, irritable, hostile, aggressive, impulsive, severely restless, overly excited and hyperactive, or not being able to sleep. If this happens, especially at the beginning of treatment or after a change in dose, call your health care professional. This medicine can cause an increase in blood pressure or a faster heart beat. Check with your doctor or health care professional. You may get drowsy,  dizzy or have blurred vision. Do not drive, use machinery, or do anything that needs mental alertness until you know how this medicine affects you. Do not stand or sit up quickly, especially if you are an older patient. This reduces the risk of dizzy or fainting spells. Alcohol may increase dizziness or drowsiness. Avoid alcoholic drinks. This medicine can make your mouth dry. Chewing sugarless gum, sucking hard candy and drinking plenty of water will help. Do not treat yourself for coughs, colds, or allergies without asking your doctor or health care professional for advice. Some ingredients may increase possible side effects. What side effects may I notice from receiving this medicine? Side effects that you should report to your doctor or health care professional as soon as possible: -allergic reactions like skin rash, itching or hives, swelling of the face, lips, or tongue -breathing problems -changes in vision -hallucination, loss of contact with reality -seizures -suicidal thoughts or other mood changes -trouble passing urine or change in the amount of urine -unusual bleeding or bruising Side effects that usually do not require medical attention (report to your doctor or health care professional if they continue or are bothersome): -change in sex drive or performance -constipation -  increased sweating -loss of appetite -nausea -tremors -weight loss This list may not describe all possible side effects. Call your doctor for medical advice about side effects. You may report side effects to FDA at 1-800-FDA-1088. Where should I keep my medicine? Keep out of the reach of children. Store at a controlled temperature between 20 and 25 degrees C (68 degrees and 77 degrees F), in a dry place. Throw away any unused medicine after the expiration date. NOTE: This sheet is a summary. It may not cover all possible information. If you have questions about this medicine, talk to your doctor, pharmacist,  or health care provider.  2012, Elsevier/Gold Standard. (10/12/2009 3:21:13 PM)

## 2012-06-08 NOTE — Telephone Encounter (Signed)
appts made and printed 

## 2012-06-12 ENCOUNTER — Encounter: Payer: Self-pay | Admitting: Radiation Oncology

## 2012-06-12 ENCOUNTER — Ambulatory Visit
Admission: RE | Admit: 2012-06-12 | Discharge: 2012-06-12 | Disposition: A | Discharge status: Home / Self Care | Payer: BC Managed Care – PPO | Source: Ambulatory Visit | Attending: Radiation Oncology | Admitting: Radiation Oncology

## 2012-06-12 VITALS — BP 124/67 | HR 79 | Temp 98.2°F | Resp 20 | Wt 138.2 lb

## 2012-06-12 DIAGNOSIS — C50412 Malignant neoplasm of upper-outer quadrant of left female breast: Secondary | ICD-10-CM

## 2012-06-12 NOTE — Progress Notes (Signed)
Department of Radiation Oncology  Phone:  [phone] Fax:        [phone]   Name: MARISSIA BLACKHAM MRN: [id number]  DOB: Mar 28, 1945  Date: 06/12/2012  Follow Up Visit Note  Diagnosis: [id number] Invasive left breast cancer  Summary and Interval since last radiation: 4 months (RT to the left breast to 52.72 Gy)  Interval History: Annamary presents today for routine followup.  She still has sensitivity of her left breast and chest wall. She is taking tamoxifen and having hot flashes. She is participating in our [facility] class as well as in the live strong survivorship program. She states continues to have hot flashes. She palpated a nodule in the medial aspect of her scar. She is concerned because this feels the same as the nodule she felt that her diagnosis. She saw Dr. Carolynne Edouard earlier today. She reports no new bone pain lymphadenopathy or areas of concern in her right breast.  Allergies: No Known Allergies  Medications:  Current Outpatient Prescriptions  Medication Sig Dispense Refill  . BIOTIN PO Take 550 mg by mouth.      . calcium carbonate 200 MG capsule Take 250 mg by mouth 2 (two) times daily with a meal.      . co-enzyme Q-10 30 MG capsule Take 300 mg by mouth daily.      . diclofenac sodium (VOLTAREN) 1 % GEL Apply 1 application topically.      Marland Kitchen ezetimibe (ZETIA) 10 MG tablet Take 10 mg by mouth daily.      Marland Kitchen gabapentin (NEURONTIN) 300 MG capsule Take 1 capsule (300 mg total) by mouth at bedtime.  30 capsule  2  . ibuprofen (ADVIL,MOTRIN) 200 MG tablet Take 200 mg by mouth every 6 (six) hours as needed.      Marland Kitchen Linaclotide (LINZESS) 145 MCG CAPS Take by mouth daily.      . Multiple Vitamin (MULTIVITAMIN) tablet Take 1 tablet by mouth daily.      . non-metallic deodorant Thornton Papas) MISC Apply 1 application topically daily as needed.      . Omega-3 Fatty Acids (FISH OIL PO) Take 1 each by mouth.      Marland Kitchen omeprazole (PRILOSEC) 20 MG capsule Take 20 mg by mouth daily.      .  quinapril-hydrochlorothiazide (ACCURETIC) 10-12.5 MG per tablet Take 1 tablet by mouth daily.      . rifaximin (XIFAXAN) 550 MG TABS Take 550 mg by mouth.      . tamoxifen (NOLVADEX) 20 MG tablet Take 1 tablet (20 mg total) by mouth daily.  90 tablet  12  . tretinoin (RETIN-A) 0.05 % cream Apply topically at bedtime.      Marland Kitchen venlafaxine XR (EFFEXOR-XR) 75 MG 24 hr capsule Take 1 capsule (75 mg total) by mouth daily.  30 capsule  2   No current facility-administered medications for this encounter.    Physical Exam:  Filed Vitals:   06/12/12 1416  BP: 124/67  Pulse: 79  Temp: 98.2 F (36.8 C)  Resp: 20   she is a pleasant female in no distress sitting comfortably examining table. She has no palpable cervical supraclavicular or axillary adenopathy. She has no palpable abnormalities in her right breast. In the upper outer quadrant of the left breast near the middle of her scar there is approximately 8 mm pea-sized lesion. The rest of the tumor cavity is normal and soft.  IMPRESSION: Sherlie is a 67 y.o. female status post breast conservation currently on tamoxifen with  a palpable nodule along the scar  PLAN:  I talked to [facility] for a long time about this area in her left breast. She is very concerned that this is tumor recurrence. We talked about this being along her scar and this not being recurrence. She is really concerned about it and I think this worry is interfering with her normal activities. For this reason I ordered a mammogram and ultrasound to confirm that this is in fact scar tissue. I encouraged her to continue the survivorship program such is active in. She will talk to Dr. Welton Flakes about tamoxifen and hot flashes at her next followup visit. I will see her back in 6 months. If she feels like she is doing well and doesn't need to see me then she can of course cancel that visit.    [name], MD

## 2012-06-12 NOTE — Progress Notes (Addendum)
Pt participating in [facility], [employer] program. She is enjoying both very much. She states she occasionally has sharp pain in left breast/axilla w/exercising, or soreness w/activity. She c/o fatigue but is improving w/exercise. Gave pt brochure form [facility]. Taking Tamoxifen, c/o hot flashes.

## 2012-06-15 ENCOUNTER — Telehealth: Payer: Self-pay | Admitting: *Deleted

## 2012-06-15 NOTE — Telephone Encounter (Signed)
CALLED PATIENT TO INFORM OF MAAMMOGRAM AND ULTRASOUND, SPOKE WITH PATIENT AND SHE IS AWARE OF THESE TESTS.

## 2012-06-18 ENCOUNTER — Other Ambulatory Visit: Payer: Self-pay | Admitting: Medical Oncology

## 2012-06-18 NOTE — Telephone Encounter (Signed)
Patient called asking if ok for her to just take neurontin 200mg  instead of the 300mg  that Augustin Schooling, NP prescribed for her. Reviewed with NP and per NP if patient's hot flashes are not better on the 200 mg then it would be better for her to take the 300 mg.  Reviewed this with patient and pt states that her hot flashes "are awful" and that she will do as NP recommended to take the 300 mg at bedtime. Encouraged pt to call office with any questions or concerns. Patient verbalized thanks.

## 2012-06-25 ENCOUNTER — Other Ambulatory Visit: Payer: BC Managed Care – PPO

## 2012-06-30 ENCOUNTER — Other Ambulatory Visit: Payer: BC Managed Care – PPO

## 2012-06-30 ENCOUNTER — Telehealth: Payer: Self-pay | Admitting: *Deleted

## 2012-06-30 NOTE — Telephone Encounter (Signed)
Pt called stating she cancelled her April left breast US due to no insurance coverage. She requests Dr Luciano Cutter opinion re: waiting until her insurance changes on August 16, 2012 to have this done. She states she is "still concerned about her scar" but has many medical bills pending. She states that if Dr Michell Heinrich feels it would be okay to wait, she wants the left breast US rescheduled for after August 16, 2012.  She has a mammogram scheduled for 09/09/12.  Informed pt will route her question to Dr Michell Heinrich and call her back w/response. Pt states she is willing to wait until tomorrow. Informed pt will get back with her as soon as possible. Pt verbalized agreement, understanding.

## 2012-06-30 NOTE — Telephone Encounter (Signed)
OK.  Please also give her Tara's number to help with financial stuff until she can get insurance.

## 2012-07-01 ENCOUNTER — Telehealth: Payer: Self-pay | Admitting: *Deleted

## 2012-07-01 NOTE — Telephone Encounter (Signed)
Per Dr Michell Heinrich, called pt and informed her that it is fine to wait until June to have breast US done. Also gave her Merla Riches, financial counselor's name and contact number for financial assistance until she can get insurance. Pt verbalized thanks and understanding.

## 2012-07-13 ENCOUNTER — Encounter: Payer: Self-pay | Admitting: *Deleted

## 2012-07-16 ENCOUNTER — Encounter: Payer: Self-pay | Admitting: Cardiovascular Disease

## 2012-08-18 ENCOUNTER — Other Ambulatory Visit: Payer: Self-pay | Admitting: *Deleted

## 2012-08-18 DIAGNOSIS — R232 Flushing: Secondary | ICD-10-CM

## 2012-08-18 MED ORDER — VENLAFAXINE HCL ER 75 MG PO CP24: 75.0000 mg | ORAL_CAPSULE | Freq: Every day | ORAL | Status: DC

## 2012-08-18 MED ORDER — GABAPENTIN 300 MG PO CAPS: 300.0000 mg | ORAL_CAPSULE | Freq: Every day | ORAL | Status: DC

## 2012-08-27 ENCOUNTER — Ambulatory Visit: Payer: BC Managed Care – PPO | Admitting: Cardiovascular Disease

## 2012-09-03 ENCOUNTER — Ambulatory Visit (INDEPENDENT_AMBULATORY_CARE_PROVIDER_SITE_OTHER): Payer: Medicare Other | Admitting: Cardiovascular Disease

## 2012-09-03 ENCOUNTER — Encounter: Payer: Self-pay | Admitting: Cardiovascular Disease

## 2012-09-03 VITALS — BP 128/70 | HR 70 | Resp 16 | Ht 64.0 in | Wt 142.8 lb

## 2012-09-03 DIAGNOSIS — R7982 Elevated C-reactive protein (CRP): Secondary | ICD-10-CM

## 2012-09-03 DIAGNOSIS — R600 Localized edema: Secondary | ICD-10-CM

## 2012-09-03 DIAGNOSIS — E785 Hyperlipidemia, unspecified: Secondary | ICD-10-CM

## 2012-09-03 DIAGNOSIS — R609 Edema, unspecified: Secondary | ICD-10-CM

## 2012-09-03 DIAGNOSIS — I10 Essential (primary) hypertension: Secondary | ICD-10-CM

## 2012-09-03 DIAGNOSIS — E782 Mixed hyperlipidemia: Secondary | ICD-10-CM

## 2012-09-03 MED ORDER — LISINOPRIL-HYDROCHLOROTHIAZIDE 10-12.5 MG PO TABS: 1.0000 | ORAL_TABLET | Freq: Every day | ORAL | Status: DC

## 2012-09-03 NOTE — Patient Instructions (Addendum)
Your physician has recommended you make the following change in your medication: Start Lisinopril/ HCTZ 10/12.5mg . Cut clonidine 1/2 for for three days, then discontinue. Also, discontinue the Quinapril/HCT. Your physician recommends that you schedule a follow-up appointment in: 1 year

## 2012-09-05 ENCOUNTER — Encounter: Payer: Self-pay | Admitting: Cardiovascular Disease

## 2012-09-05 DIAGNOSIS — E785 Hyperlipidemia, unspecified: Secondary | ICD-10-CM | POA: Insufficient documentation

## 2012-09-05 DIAGNOSIS — R600 Localized edema: Secondary | ICD-10-CM | POA: Insufficient documentation

## 2012-09-05 DIAGNOSIS — I10 Essential (primary) hypertension: Secondary | ICD-10-CM | POA: Insufficient documentation

## 2012-09-05 DIAGNOSIS — R7982 Elevated C-reactive protein (CRP): Secondary | ICD-10-CM | POA: Insufficient documentation

## 2012-09-05 NOTE — Progress Notes (Signed)
Patient ID: KERITH SHERLEY, female   DOB: May 04, 1945, 67 y.o.   MRN: [id number]      Reason for office visit Hypertension, edema  Mrs. Schindler previously was followed by Dr. Caprice Kluver until he retired about a year ago. She was diagnosed with breast cancer  (DCIS) last July and underwent lumpectomy and radiation therapy. She is considered disease free and is currently on tamoxifen. She has had severe hot flashes with this medication. She was prescribed clonidine in an effort to abate these, but it hasn't really helped. Effexor hasn't helped either.  She has noticed that she has ankle edema. She is to have no problems when she was taking Accuretic for blood pressure. This medication is currently not available from the pharmacy  Mrs. Weill has been worried about her risks for coronary problems because of a strong family history of CAD. Her nuclear stress test in 2010 was normal. She has no angina. She has had mildly elevated cholesterol levels. She had 2 consecutive CRP values there were high at around 4 and almost 6 and this led to a recommendation for statin therapy. She has been intolerant unfortunately to numerous statins and is no longer taking them. She inquires about the role of omega-3 fatty acid supplements and coenzyme Q 10. On the Zetia monotherapy her LDL is only 85. It should be noted that she has excellent HDL cholesterol and low triglycerides   Allergies  Allergen Reactions  . Statins     Current Outpatient Prescriptions  Medication Sig Dispense Refill  . BIOTIN PO Take 550 mg by mouth.      . diclofenac sodium (VOLTAREN) 1 % GEL Apply 1 application topically.      Marland Kitchen ezetimibe (ZETIA) 10 MG tablet Take 10 mg by mouth daily.      Marland Kitchen gabapentin (NEURONTIN) 300 MG capsule Take 1 capsule (300 mg total) by mouth at bedtime.  30 capsule  3  . ibuprofen (ADVIL,MOTRIN) 200 MG tablet Take 200 mg by mouth every 6 (six) hours as needed.      . Multiple Vitamin (MULTIVITAMIN) tablet Take  1 tablet by mouth daily.      . non-metallic deodorant Thornton Papas) MISC Apply 1 application topically daily as needed.      . Omega-3 Fatty Acids (FISH OIL PO) Take 1 each by mouth.      Marland Kitchen omeprazole (PRILOSEC) 20 MG capsule Take 20 mg by mouth daily.      . RESTASIS 0.05 % ophthalmic emulsion Apply 2 drops to eye daily.      . tamoxifen (NOLVADEX) 20 MG tablet Take 1 tablet (20 mg total) by mouth daily.  90 tablet  12  . tretinoin (RETIN-A) 0.05 % cream Apply topically at bedtime.      Marland Kitchen venlafaxine XR (EFFEXOR-XR) 75 MG 24 hr capsule Take 1 capsule (75 mg total) by mouth daily.  30 capsule  3  . ALPRAZolam (XANAX) 0.25 MG tablet Take 0.25 mg by mouth at bedtime as needed for sleep.      . calcium carbonate 200 MG capsule Take 250 mg by mouth 2 (two) times daily with a meal.      . co-enzyme Q-10 30 MG capsule Take 300 mg by mouth daily.      Marland Kitchen Linaclotide (LINZESS) 145 MCG CAPS Take by mouth daily.      Marland Kitchen lisinopril-hydrochlorothiazide (ZESTORETIC) 10-12.5 MG per tablet Take 1 tablet by mouth daily.  90 tablet  3  . nortriptyline (PAMELOR) 25  MG capsule Take 25 mg by mouth 2 (two) times daily.      . rifaximin (XIFAXAN) 550 MG TABS Take 550 mg by mouth.       No current facility-administered medications for this visit.    Past Medical History  Diagnosis Date  . IBS (irritable bowel syndrome)   . Arthritis   . Breast cancer 10/18/2011    dcis, left breast, ER/PR +  . GERD (gastroesophageal reflux disease)   . H/O colonoscopy   . H/O bone density study   . Night sweats   . Sinus complaint   . Heartburn   . Psoriasis   . Anxiety   . Depression   . Hot flashes   . Dysrhythmia     palpitations  . Hypertension   . History of radiation therapy 12/24/11 -01/21/12    left breast  . Hypertension   . Hyperlipidemia   . Abnormal C-reactive protein   . Breast cancer, stage 1     left    Past Surgical History  Procedure Laterality Date  . Cataract extraction    . Tonsillectomy and  adenoidectomy    . Dilation and curettage of uterus    . Abdominal hysterectomy    . Hand surgery      RSD  . Colonoscopy    . Upper gastrointestinal endoscopy    . Breast surgery  10/18/2011    LT br lumpectomy  . Nm myoview ltd  10/19/2008    normal    Family History  Problem Relation Age of Onset  . Cancer Paternal Grandmother     "questionable breast cancer"  . Cancer Cousin     maternal; colon ca  . Heart attack Father   . Hyperlipidemia Mother   . Heart attack Brother   . Hypertension Brother   . Hyperlipidemia Brother     History   Social History  . Marital Status: Married    Spouse Name: N/A    Number of Children: N/A  . Years of Education: N/A   Occupational History  . Not on file.   Social History Main Topics  . Smoking status: Former Smoker -- 1.00 packs/day    Types: Cigarettes    Quit date: 10/14/1981  . Smokeless tobacco: Never Used  . Alcohol Use: 4.2 oz/week    7 Glasses of wine per week  . Drug Use: No  . Sexually Active: Yes   Other Topics Concern  . Not on file   Social History Narrative  . No narrative on file    Review of systems: The patient specifically denies any chest pain at rest or with exertion, dyspnea at rest or with exertion, orthopnea, paroxysmal nocturnal dyspnea, syncope, palpitations, focal neurological deficits, intermittent claudication, lower extremity edema, unexplained weight gain, cough, hemoptysis or wheezing.  The patient has abdominal pain, nausea, vomiting, dysphagia, diarrhea, constipation which have been attributed to irritable bowel syndrome. She denies polyuria, polydipsia, dysuria, hematuria, frequency, urgency, abnormal bleeding or bruising, fever, chills, unexpected weight changes, mood swings, change in skin or hair texture, change in voice quality, auditory or visual problems, allergic reactions or rashes, new musculoskeletal complaints other than usual "aches and pains".   PHYSICAL EXAM BP 128/70  Pulse  70  Resp 16  Ht 5\' 4"  (1.626 m)  Wt 142 lb 12.8 oz (64.774 kg)  BMI 24.5 kg/m2  General: Alert, oriented x3, no distress Head: no evidence of trauma, PERRL, EOMI, no exophtalmos or lid lag, no myxedema, no  xanthelasma; normal ears, nose and oropharynx Neck: normal jugular venous pulsations and no hepatojugular reflux; brisk carotid pulses without delay and no carotid bruits Chest: clear to auscultation, no signs of consolidation by percussion or palpation, normal fremitus, symmetrical and full respiratory excursions Cardiovascular: normal position and quality of the apical impulse, regular rhythm, normal first and second heart sounds, no murmurs, rubs or gallops Abdomen: no tenderness or distention, no masses by palpation, no abnormal pulsatility or arterial bruits, normal bowel sounds, no hepatosplenomegaly Extremities: no clubbing, cyanosis or edema; 2+ radial, ulnar and brachial pulses bilaterally; 2+ right femoral, posterior tibial and dorsalis pedis pulses; 2+ left femoral, posterior tibial and dorsalis pedis pulses; no subclavian or femoral bruits Neurological: grossly nonfocal   EKG: Sinus rhythm normal EKG  Lipid Panel June 2013 total cholesterol 194 triglycerides 145 HDL 84 LDL 81   BMET    Component Value Date/Time   NA 140 06/08/2012 1407   NA 137 10/17/2011 1502   K 3.9 06/08/2012 1407   K 3.2* 10/17/2011 1502   CL 103 06/08/2012 1407   CL 98 10/17/2011 1502   CO2 28 06/08/2012 1407   CO2 29 10/17/2011 1502   GLUCOSE 99 06/08/2012 1407   GLUCOSE 102* 10/17/2011 1502   BUN 13.1 06/08/2012 1407   BUN 11 10/17/2011 1502   CREATININE 0.8 06/08/2012 1407   CREATININE 0.73 10/17/2011 1502   CALCIUM 9.0 06/08/2012 1407   CALCIUM 9.2 10/17/2011 1502   GFRNONAA 88* 10/17/2011 1502   GFRAA >90 10/17/2011 1502     ASSESSMENT AND PLAN Elevated C-reactive protein (CRP) Retrospectively, this may have been cancer related rather than a marker of coronary risk. In fact a CRP as high as 6 is most likely  not related to vascular inflammation, hence I do not think it should be use as a criteria to decide whether or not she needs statin therapy.  Essential hypertension She was doing better when taking diuretics and on clonidine. She does not feel that this medication has helped but have told her that it needs to be weaned gradually rather than subtly stopped. We will restart lisinopril/hydrochlorothiazide 10/12.5 mg once daily (apparently her old medication Accuretic is currently on backorder)  Lower leg edema She has some evidence of varicosities on exam and peripheral venous insufficiency may be responsible for some of her edema. It should get better when she is back on a diuretic.  Dyslipidemia I think her lipid profile is quite acceptable word is. I don't think she would benefit from additional medical 3 fatty acid supplements or coenzyme Q 10. Statins are really not indicated. Appears to have a favorable lipid profile and likely has large LDL particles   Orders Placed This Encounter  Procedures  . EKG 12-Lead   Meds ordered this encounter  Medications  . RESTASIS 0.05 % ophthalmic emulsion    Sig: Apply 2 drops to eye daily.  Marland Kitchen DISCONTD: cloNIDine (CATAPRES) 0.2 MG tablet    Sig: Take 0.2 mg by mouth 2 (two) times daily.  Marland Kitchen lisinopril-hydrochlorothiazide (ZESTORETIC) 10-12.5 MG per tablet    Sig: Take 1 tablet by mouth daily.    Dispense:  90 tablet    Refill:  3    Elainah Rhyne  Thurmon Fair, MD, [facility] [phone] office [phone] pager

## 2012-09-05 NOTE — Assessment & Plan Note (Signed)
She has some evidence of varicosities on exam and peripheral venous insufficiency may be responsible for some of her edema. It should get better when she is back on a diuretic.

## 2012-09-05 NOTE — Assessment & Plan Note (Signed)
Retrospectively, this may have been cancer related rather than a marker of coronary risk. In fact a CRP as high as 6 is most likely not related to vascular inflammation, hence I do not think it should be use as a criteria to decide whether or not she needs statin therapy.

## 2012-09-05 NOTE — Assessment & Plan Note (Addendum)
I think her lipid profile is quite acceptable where it is. I don't think she would benefit from additional omega 3 fatty acid supplements or coenzyme Q 10. Statins are really not indicated. Appears to have a favorable lipid profile and likely has large LDL particles

## 2012-09-05 NOTE — Assessment & Plan Note (Signed)
She was doing better when taking diuretics and on clonidine. She does not feel that this medication has helped but have told her that it needs to be weaned gradually rather than subtly stopped. We will restart lisinopril/hydrochlorothiazide 10/12.5 mg once daily (apparently her old medication Accuretic is currently on backorder)

## 2012-09-07 ENCOUNTER — Telehealth: Payer: Self-pay | Admitting: Medical Oncology

## 2012-09-07 ENCOUNTER — Ambulatory Visit: Payer: Self-pay | Admitting: Specialist

## 2012-09-07 NOTE — Progress Notes (Signed)
Provided individual counseling with PT on 09/02/12.  -Kandy Garrison, M.Ed., NCC

## 2012-09-07 NOTE — Progress Notes (Deleted)
Provided individual counseling with PT on 09/02/12.  -Damyia Strider, M.Ed., NCC 

## 2012-09-07 NOTE — Telephone Encounter (Signed)
Please have her come into the next available appt.

## 2012-09-07 NOTE — Telephone Encounter (Signed)
sw pt gv appt for 09/09/12@ 2:45pm. pt does not want to attend the appt due she's decided to go have her mammogram first before seeing kk...td

## 2012-09-07 NOTE — Telephone Encounter (Signed)
That sounds fine for her wait to see me after the mammogram

## 2012-09-07 NOTE — Telephone Encounter (Signed)
Patient called to report that she is having "soreness to my left breast and feels a little swollen now for 2 days. Something's just not right." Patient also reports tamoxifen causing "hot flashes, sweat is pouring out of me. The neurontin helped me sleep some, but the hot flashes are too much. Is there an alternative?'  Patient requesting appt with MD asap. sched appt 12/11/12 lab/MD  Patient has sched mammogram and [place] appt 06/25.  Will review pt's concerns with MD.

## 2012-09-08 ENCOUNTER — Ambulatory Visit: Payer: Self-pay | Admitting: Specialist

## 2012-09-09 ENCOUNTER — Ambulatory Visit
Admission: RE | Admit: 2012-09-09 | Discharge: 2012-09-09 | Disposition: A | Discharge status: Home / Self Care | Payer: Medicare Other | Source: Ambulatory Visit | Attending: Radiation Oncology | Admitting: Radiation Oncology

## 2012-09-09 ENCOUNTER — Ambulatory Visit: Payer: Medicare Other | Admitting: Oncology

## 2012-09-09 DIAGNOSIS — C50412 Malignant neoplasm of upper-outer quadrant of left female breast: Secondary | ICD-10-CM

## 2012-09-11 ENCOUNTER — Other Ambulatory Visit: Payer: Self-pay | Admitting: *Deleted

## 2012-09-11 NOTE — Telephone Encounter (Signed)
Received refill requests for meds. Spoke with pharmacy, records show both were escribed earlier this month. He was able to confirm this. Cancel refill request.

## 2012-09-14 NOTE — Progress Notes (Signed)
Had individual counseling session with Pt on 09-08-12  -Kandy Garrison, M.Ed., NCC

## 2012-09-15 ENCOUNTER — Telehealth: Payer: Self-pay | Admitting: *Deleted

## 2012-09-15 ENCOUNTER — Encounter: Payer: Self-pay | Admitting: Radiation Oncology

## 2012-09-15 ENCOUNTER — Ambulatory Visit
Admission: RE | Admit: 2012-09-15 | Discharge: 2012-09-15 | Disposition: A | Discharge status: Home / Self Care | Payer: Medicare Other | Source: Ambulatory Visit | Attending: Radiation Oncology | Admitting: Radiation Oncology

## 2012-09-15 VITALS — BP 139/67 | HR 77 | Temp 98.3°F | Resp 20 | Wt 140.4 lb

## 2012-09-15 DIAGNOSIS — C50412 Malignant neoplasm of upper-outer quadrant of left female breast: Secondary | ICD-10-CM

## 2012-09-15 NOTE — Progress Notes (Signed)
Department of Radiation Oncology  Phone:  [phone] Fax:        [phone]   Name: MYSTI HALEY MRN: [id number]  DOB: 02-19-1946  Date: 09/15/2012  Follow Up Visit Note  Diagnosis: [id number] Invasive Left Breast Cancer  Summary and Interval since last radiation: 8 months (finished 52.72 Gy on 01/21/12)  Interval History: Shawnya presents today for routine followup.  She presented with pain in her left lower outer quadrant. She had bilateral mammograms and left breast ultrasound performed on 09/09/2012. The mammogram was negative for anything besides expected postoperative change. No palpable mass was discernible. Ultrasound was negative for any abnormalities. She saw her primary care physician at [facility] who ordered a chest x-ray which showed no fractures. I do not have the results of these x-rays. She then followed up with me. She states that she's been taking 600 mg of ibuprofen 3 times a day and that is significantly lessened her pain over the last week. It is bearable except when she presses on this area. This area of point tenderness. She is able to breathe normally and is taking care of her grandchildren going about her normal activities of daily living. She was having pain with breathing and with a cough but that has resolved. She continues to struggle with hot flashes. She is on tamoxifen. She had sort of slacked off on taking her vitamin D. and calcium after hearing some scary news reports that his been told by her PCP to go back on this. She states the Neurontin is not working as well at night as it was in the past in controlling his hot flashes.  Allergies:  Allergies  Allergen Reactions  . Statins     Medications:  Current Outpatient Prescriptions  Medication Sig Dispense Refill  . BIOTIN PO Take 550 mg by mouth.      . calcium carbonate 200 MG capsule Take 250 mg by mouth 2 (two) times daily with a meal.      . diclofenac sodium (VOLTAREN) 1 % GEL Apply 1 application  topically.      Marland Kitchen ezetimibe (ZETIA) 10 MG tablet Take 10 mg by mouth daily.      Marland Kitchen gabapentin (NEURONTIN) 300 MG capsule Take 1 capsule (300 mg total) by mouth at bedtime.  30 capsule  3  . ibuprofen (ADVIL,MOTRIN) 200 MG tablet Take 200 mg by mouth every 6 (six) hours as needed.      Marland Kitchen lisinopril-hydrochlorothiazide (ZESTORETIC) 10-12.5 MG per tablet Take 1 tablet by mouth daily.  90 tablet  3  . Multiple Vitamin (MULTIVITAMIN) tablet Take 1 tablet by mouth daily.      . Omega-3 Fatty Acids (FISH OIL PO) Take 1 each by mouth.      Marland Kitchen omeprazole (PRILOSEC) 20 MG capsule Take 20 mg by mouth daily.      . RESTASIS 0.05 % ophthalmic emulsion Apply 2 drops to eye daily.      . tamoxifen (NOLVADEX) 20 MG tablet Take 1 tablet (20 mg total) by mouth daily.  90 tablet  12  . tretinoin (RETIN-A) 0.05 % cream Apply topically at bedtime.      Marland Kitchen venlafaxine XR (EFFEXOR-XR) 75 MG 24 hr capsule Take 1 capsule (75 mg total) by mouth daily.  30 capsule  3  . ALPRAZolam (XANAX) 0.25 MG tablet Take 0.25 mg by mouth at bedtime as needed for sleep.      Marland Kitchen co-enzyme Q-10 30 MG capsule Take 300 mg by mouth daily.      Marland Kitchen  Linaclotide (LINZESS) 145 MCG CAPS Take by mouth daily.      . nortriptyline (PAMELOR) 25 MG capsule Take 25 mg by mouth 2 (two) times daily.      . rifaximin (XIFAXAN) 550 MG TABS Take 550 mg by mouth.       No current facility-administered medications for this encounter.    Physical Exam:  Filed Vitals:   09/15/12 1405  BP: 139/67  Pulse: 77  Temp: 98.3 F (36.8 C)  Resp: 20   she is a pleasant female in no distress sitting comfortably examining table. She has no palpable abnormalities of the right breast. The left breast has some edema especially in the outer aspect of the left breast. She has point tenderness at about the 5:00 position of the left breast. Over a rib. No palpable abnormalities of the left breast. Her scar is well-healed. She has some scar tissue palpable just a few  millimeters in around the scar but nothing concerning. She has no palpable supraclavicular axillary or cervical adenopathy.  IMPRESSION: Akaila is a 67 y.o. female status post breast conservation with point tenderness of the left breast and negative mammograms and ultrasound  PLAN:  I talked to [place] that she might have strained or pulled a muscle. She remembers pulling a heavy bag out of the car and this could have been the inciting event. Since it's getting better I think we can just continue to monitor this. More concerning any effect his hot flashes continue to have on her every day life. She actually canceled a trip with her grandchildren to 2 fatigue which she attributes to not sleeping and just having these hot flashes during the day. I encouraged her to talk to Dr. Welton Flakes about possibly switching to an aromatase inhibitor. I cautioned her that perhaps her bone density would not allow this but it was for conversation. We also talked about perhaps increasing her Neurontin. She would like to talk to Dr. Welton Flakes before doing this as well. I made a followup visit with me in 3 months. She now she can always call with any questions in the interim.    [name], MD

## 2012-09-15 NOTE — Telephone Encounter (Signed)
Patient returned call, said yes, she would like to see MD,can she be seen today?asked patient to hold, asked MD, patient can come i as f/u today, 2 pm to be seen patient preference, and to thank Md for seeing her today,  asked [place] to schedule this as a f/u  Per MD 12:07 PM

## 2012-09-15 NOTE — Telephone Encounter (Signed)
cvalld home and cell, left voice messages per MD to see if patient wanted to come see her, can schedule f/u , U/S results from 09/10/12 11:45 AM

## 2012-09-15 NOTE — Telephone Encounter (Signed)
xxx

## 2012-09-15 NOTE — Progress Notes (Signed)
Follow up s/p left breast rad txs 12/24/11-01/21/12, alert oriented x3, patient still c/o soreness under left axilla rib area, had U/S done 09/09/12 and mammogram also, takes tamoxifen 20mg  daily having hot flashes still stated, appetite good, fatigued still,she feels the hot flashes is the cause,  Drinks plenty water 2:10 PM

## 2012-09-15 NOTE — Telephone Encounter (Signed)
Message copied by Lacy Duverney on Tue Sep 15, 2012 11:40 AM ------      Message from: [name]      Created: Tue Sep 15, 2012 11:30 AM       Dorise Bullion.  Can you call Mrs. Prewitt and see if she would liek to come in and see me?            SW ------

## 2012-09-17 ENCOUNTER — Ambulatory Visit: Payer: Self-pay | Admitting: Specialist

## 2012-09-22 NOTE — Progress Notes (Signed)
Saw Pt for individual counseling on 09-17-12  -Kandy Garrison, M.Ed., NCC

## 2012-10-01 ENCOUNTER — Ambulatory Visit: Payer: Self-pay | Admitting: Specialist

## 2012-10-02 NOTE — Progress Notes (Signed)
Met with Pt for individual counseling on 10/01/12.  -Kandy Garrison, M.Ed., LPCA, NCC

## 2012-10-07 ENCOUNTER — Ambulatory Visit: Payer: Self-pay | Admitting: Specialist

## 2012-10-12 NOTE — Progress Notes (Signed)
Met with Pt for final individual counseling session on 10-07-12  -Joee Iovine, M.Ed., LPCA, NCC

## 2012-10-13 NOTE — Telephone Encounter (Signed)
RECEIVED A FAX FROM [facility] CONCERNING A PT'S REQUEST FOR A 90 DAY SUPPLY OF GABAPENTIN 300MG . THIS REQUEST WAS GRANTED WITH NO ADDITIONAL REFILLS. RETURNED FAX TO PHARMACY.

## 2012-11-12 ENCOUNTER — Telehealth: Payer: Self-pay | Admitting: Cardiovascular Disease

## 2012-11-12 MED ORDER — EZETIMIBE 10 MG PO TABS: 10.0000 mg | ORAL_TABLET | Freq: Every day | ORAL | Status: DC

## 2012-11-12 NOTE — Telephone Encounter (Signed)
Samples left at front desk for pt pick up.  Lot: [id number] Exp: 08/2014.  Left message samples at front desk.

## 2012-11-12 NOTE — Telephone Encounter (Signed)
Pt requesting samples of 10  mg Zetia.

## 2012-12-10 ENCOUNTER — Ambulatory Visit: Payer: BC Managed Care – PPO | Admitting: Radiation Oncology

## 2012-12-11 ENCOUNTER — Other Ambulatory Visit (HOSPITAL_BASED_OUTPATIENT_CLINIC_OR_DEPARTMENT_OTHER): Payer: Medicare Other | Admitting: Lab

## 2012-12-11 ENCOUNTER — Encounter: Payer: Self-pay | Admitting: Adult Health

## 2012-12-11 ENCOUNTER — Telehealth: Payer: Self-pay | Admitting: Adult Health

## 2012-12-11 ENCOUNTER — Ambulatory Visit (HOSPITAL_BASED_OUTPATIENT_CLINIC_OR_DEPARTMENT_OTHER): Payer: Medicare Other | Admitting: Adult Health

## 2012-12-11 VITALS — BP 118/70 | HR 76 | Temp 97.5°F | Resp 20 | Ht 64.0 in | Wt 140.9 lb

## 2012-12-11 DIAGNOSIS — C50412 Malignant neoplasm of upper-outer quadrant of left female breast: Secondary | ICD-10-CM

## 2012-12-11 DIAGNOSIS — Z170 Estrogen receptor positive status [ER+]: Secondary | ICD-10-CM

## 2012-12-11 DIAGNOSIS — C50419 Malignant neoplasm of upper-outer quadrant of unspecified female breast: Secondary | ICD-10-CM

## 2012-12-11 DIAGNOSIS — N959 Unspecified menopausal and perimenopausal disorder: Secondary | ICD-10-CM

## 2012-12-11 DIAGNOSIS — E2839 Other primary ovarian failure: Secondary | ICD-10-CM

## 2012-12-11 LAB — CBC WITH DIFFERENTIAL/PLATELET
Basophils Absolute: 0.1 10*3/uL (ref 0.0–0.1)
EOS%: 6.4 % (ref 0.0–7.0)
HCT: 37.3 % (ref 34.8–46.6)
HGB: 12.8 g/dL (ref 11.6–15.9)
MCH: 32.6 pg (ref 25.1–34.0)
MCV: 94.6 fL (ref 79.5–101.0)
NEUT%: 62.6 % (ref 38.4–76.8)
lymph#: 1.1 10*3/uL (ref 0.9–3.3)

## 2012-12-11 LAB — COMPREHENSIVE METABOLIC PANEL (CC13)
AST: 13 U/L (ref 5–34)
BUN: 14.3 mg/dL (ref 7.0–26.0)
Calcium: 9.2 mg/dL (ref 8.4–10.4)
Chloride: 105 mEq/L (ref 98–109)
Creatinine: 0.8 mg/dL (ref 0.6–1.1)

## 2012-12-11 NOTE — Progress Notes (Addendum)
OFFICE PROGRESS NOTE  CC  Catherine Kim, PA-C [address] [place] [address] Dr. [name]  DIAGNOSIS: Invasive ductal carcinoma of the left breast (T1aN0) stage I  PRIOR THERAPY: 1. S/p Lumpectomy with sentinel node biopsy with the final showing an ER+ T1aN0 invasive ductal carcinoma with DCIS  #2 patient is now status post radiation therapy completed on 01/21/2012.  #3 eventually she will begin antiestrogen therapy consisting of tamoxifen 20 mg daily  CURRENT THERAPY: Tamoxifen 20 mg daily to begin today 02/24/2012  INTERVAL HISTORY: [name] 67 y.o. female returns for follow up visit.  She continues to take the tamoxifen daily, and continues to have severe hot flashes.  We started Effexor and Gabapentin 6 months ago and they improved slightly in the beginning, however they have returned and are making her miserable.  She has gained weight and is unhappy about this also.  She has neck pain, and this is ongoing for several years and hasn't changed.  She is constipated and takes miralax which helps alleviate this.  Otherwise, she denies fevers, fatigue, new pain, unintentional weight loss, or any further concerns.  We reviewed her health maintenance below.   MEDICAL HISTORY: Past Medical History  Diagnosis Date  . IBS (irritable bowel syndrome)   . Arthritis   . Breast cancer 10/18/2011    dcis, left breast, ER/PR +  . GERD (gastroesophageal reflux disease)   . H/O colonoscopy   . H/O bone density study   . Night sweats   . Sinus complaint   . Heartburn   . Psoriasis   . Anxiety   . Depression   . Hot flashes   . Dysrhythmia     palpitations  . Hypertension   . History of radiation therapy 12/24/11 -01/21/12    left breast  . Hypertension   . Hyperlipidemia   . Abnormal C-reactive protein   . Breast cancer, stage 1     left    ALLERGIES:  is allergic to statins.  MEDICATIONS:  Current Outpatient Prescriptions  Medication Sig Dispense  Refill  . ALPRAZolam (XANAX) 0.25 MG tablet Take 0.25 mg by mouth at bedtime as needed for sleep.      Marland Kitchen BIOTIN PO Take 550 mg by mouth.      . calcium carbonate 200 MG capsule Take 250 mg by mouth 2 (two) times daily with a meal.      . co-enzyme Q-10 30 MG capsule Take 300 mg by mouth daily.      . diclofenac sodium (VOLTAREN) 1 % GEL Apply 1 application topically.      Marland Kitchen ezetimibe (ZETIA) 10 MG tablet Take 1 tablet (10 mg total) by mouth daily.  28 tablet  0  . gabapentin (NEURONTIN) 300 MG capsule Take 1 capsule (300 mg total) by mouth at bedtime.  30 capsule  3  . ibuprofen (ADVIL,MOTRIN) 200 MG tablet Take 200 mg by mouth every 6 (six) hours as needed.      Marland Kitchen lisinopril-hydrochlorothiazide (ZESTORETIC) 10-12.5 MG per tablet Take 1 tablet by mouth daily.  90 tablet  3  . Multiple Vitamin (MULTIVITAMIN) tablet Take 1 tablet by mouth daily.      . Omega-3 Fatty Acids (FISH OIL PO) Take 1 each by mouth.      Marland Kitchen omeprazole (PRILOSEC) 20 MG capsule Take 20 mg by mouth daily.      . RESTASIS 0.05 % ophthalmic emulsion Apply 2 drops to eye daily.      Marland Kitchen  rifaximin (XIFAXAN) 550 MG TABS Take 550 mg by mouth.      . tamoxifen (NOLVADEX) 20 MG tablet Take 1 tablet (20 mg total) by mouth daily.  90 tablet  12  . tretinoin (RETIN-A) 0.05 % cream Apply topically at bedtime.      Marland Kitchen venlafaxine XR (EFFEXOR-XR) 75 MG 24 hr capsule Take 1 capsule (75 mg total) by mouth daily.  30 capsule  3   No current facility-administered medications for this visit.    SURGICAL HISTORY:  Past Surgical History  Procedure Laterality Date  . Cataract extraction    . Tonsillectomy and adenoidectomy    . Dilation and curettage of uterus    . Abdominal hysterectomy    . Hand surgery      RSD  . Colonoscopy    . Upper gastrointestinal endoscopy    . Breast surgery  10/18/2011    LT br lumpectomy  . Nm myoview ltd  10/19/2008    normal    REVIEW OF SYSTEMS:  A 10 point review of systems was conducted and is  otherwise negative except for what is noted above.     Health Maintenance  Mammogram: 09/09/12 Colonoscopy: 5 years ago with 5 year f/u recommended Bone Density Scan: 09/06/10 Pap Smear: s/p TAH, unsure if BSO or not Eye Exam:  08/2012 Vitamin D Level: pending Lipid Panel: 08/2012   PHYSICAL EXAMINATION: BP 118/70  Pulse 76  Temp(Src) 97.5 F (36.4 C) (Oral)  Resp 20  Ht 5\' 4"  (1.626 m)  Wt 140 lb 14.4 oz (63.912 kg)  BMI 24.17 kg/m2 General: Patient is a well appearing female in no acute distress HEENT: PERRLA, sclerae anicteric no conjunctival pallor, MMM Neck: supple, no palpable adenopathy Lungs: clear to auscultation bilaterally, no wheezes, rhonchi, or rales Cardiovascular: regular rate rhythm, S1, S2, no murmurs, rubs or gallops Abdomen: Soft, non-tender, non-distended, normoactive bowel sounds, no HSM Extremities: warm and well perfused, no clubbing, cyanosis, or edema Skin: No rashes or lesions Neuro: Non-focal Breast: left breast lumpectomy site without nodularity, no masses or skin changes, right breast no masses, nodules or skin changes.  ECOG PERFORMANCE STATUS: 0 - Asymptomatic    LABORATORY DATA: Lab Results  Component Value Date   WBC 5.4 12/11/2012   HGB 12.8 12/11/2012   HCT 37.3 12/11/2012   MCV 94.6 12/11/2012   PLT 263 12/11/2012      Chemistry      Component Value Date/Time   NA 142 12/11/2012 0956   NA 137 10/17/2011 1502   K 3.9 12/11/2012 0956   K 3.2* 10/17/2011 1502   CL 103 06/08/2012 1407   CL 98 10/17/2011 1502   CO2 28 12/11/2012 0956   CO2 29 10/17/2011 1502   BUN 14.3 12/11/2012 0956   BUN 11 10/17/2011 1502   CREATININE 0.8 12/11/2012 0956   CREATININE 0.73 10/17/2011 1502      Component Value Date/Time   CALCIUM 9.2 12/11/2012 0956   CALCIUM 9.2 10/17/2011 1502   ALKPHOS 54 12/11/2012 0956   ALKPHOS 73 10/02/2011 1603   AST 13 12/11/2012 0956   AST 15 10/02/2011 1603   ALT 8 12/11/2012 0956   ALT 10 10/02/2011 1603   BILITOT 0.36 12/11/2012  0956   BILITOT 0.2* 10/02/2011 1603     ADDITIONAL INFORMATION: PROGNOSTIC INDICATORS - ACIS Results There is insufficient carcinoma present for analysis. All controls stained appropriately Pecola Leisure MD Pathologist, Electronic Signature ( Signed 10/02/2011) CHROMOGENIC IN-SITU HYBRIDIZATION Interpretation: There is  insufficient carcinoma present for Her2 analysis. Reference range: Ratio: HER2:CEP17 < 1.8 - gene amplification not observed Ratio: HER2:CEP 17 1.8-2.2 - equivocal result Ratio: HER2:CEP17 > 2.2 - gene amplification observed Pecola Leisure MD Pathologist, Electronic Signature ( Signed 10/01/2011) FINAL DIAGNOSIS 1 of 3 FINAL for Koppelman, Yenesis ([id number]) Diagnosis Breast, left, needle core biopsy, UOQ - INVASIVE DUCTAL CARCINOMA WITH MUCINOUS FEATURES. - ATYPICAL HYPERPLASIA WITH ASSOCIATED CALCIFICATION. - FIBROCYSTIC CHANGES WITH ASSOCIATED CALCIFICATION. - SEE COMMENT. Microscopic Comment Although definitive grading of breast carcinoma is best done on excision, the features of the tumor from the left upper outer quadrant needle biopsy are compatible with a grade I breast carcinoma with mucinous features. Very little tumor is present on histologic examination. A breast prognostic profile will be attempted, although, the paucity of tumor may be quantitatively insufficient. The findings are called to [facility] on 09/26/11. Both Dr. Colonel Bald and Dr. Frederica Kuster have seen this case in consultation with agreement that there is invasive ductal carcinoma present. (RAH:gt, 09/26/11) Zandra Abts MD Pathologist, Electronic Signature (Case signed 09/26/2011) Specimen G  ADDITIONAL INFORMATION: 3. PROGNOSTIC INDICATORS - ACIS Results IMMUNOHISTOCHEMICAL AND MORPHOMETRIC ANALYSIS BY THE AUTOMATED CELLULAR IMAGING SYSTEM (ACIS) Estrogen Receptor (Negative, <1%): 99%, STRONG STAINING INTENSITY Progesterone Receptor (Negative, <1%): 100%, STRONG STAINING  INTENSIT Y All controls stained appropriately Pecola Leisure MD Pathologist, Electronic Signature ( Signed 10/23/2011) FINAL DIAGNOSIS Diagnosis 1. Lymph node, sentinel, biopsy, Left #1 - THERE IS NO EVIDENCE OF CARCINOMA IN 1 OF 1 LYMPH NODE (0/1). 2. Lymph node, sentinel, biopsy, Left #2 - THERE IS NO EVIDENCE OF CARCINOMA IN 1 OF 1 LYMPH NODE (0/1). 3. Breast, lumpectomy, Left - DUCTAL CARCINOMA IN SITU WITH ASSOCIATED MUCIN, LOW GRADE, SPANNING 1.2 CM. - THE SURGICAL RESECTION MARGINS ARE NEGATIVE FOR DUCTAL CARCINOMA. - SEE ONCOLOGY TABLE BELOW. Microscopic Comment 3. BREAST, INVASIVE TUMOR, WITH LYMPH NODE SAMPLING (Including data from patient's previous core biopsy, [id number]) 1 of 3 FINAL for Mccroskey, Christinna ([id number]) Microscopic Comment(continued) Specimen, including laterality: Left breast Procedure: Needle localized lumpectomy Grade: I Tubule formation: 1 Nuclear pleomorphism: 1 Mitotic: 1 Tumor size (glass slide measurement): less than 0.1 cm Margins: Negative for carcinoma Invasive, distance to closest margin: Greater than 0.2 cm to all margins In-situ, distance to closest margin: Greater than 0.2 cm to all margins Lymphovascular invasion: Not identified Ductal carcinoma in situ: Present Grade: Low grade Extensive intraductal component: Yes Lobular neoplasia: Not identified Tumor focality: Unifocal Treatment effect: N/A Extent of tumor: Confined to breast parenchyma Lymph nodes: # examined: 2 Lymph nodes with metastasis: 0 Breast prognostic profile: Estrogen and progesterone receptor studies will be performed on the current case and the results reported separately. Non-neoplastic breast: Healing biopsy site TNM: pT1mi, pN0 Comments: The current specimen contains low grade ductal carcinoma in situ associated with extracellular mucin. In addition, there are scattered acellular pools of mucin. However, no carcinoma cell are identified within these pools  The size and grade of the invasive component are based on the patient's previous core biopsy. Of note, the entire localized area of the current specimen has been submitted for histologic evaluation. (JBK:kh 10-21-11)  RADIOGRAPHIC STUDIES:  Nm Sentinel Node Inj-no Rpt (breast)  10/18/2011  CLINICAL DATA: left breast cancer   Sulfur colloid was injected intradermally by the nuclear medicine  technologist for breast cancer sentinel node localization.     Mm Breast Surgical Specimen  10/18/2011  *RADIOLOGY REPORT*  Clinical Data:  Mucinous carcinoma, left breast.  NEEDLE LOCALIZATION WITH  MAMMOGRAPHIC GUIDANCE AND SPECIMEN RADIOGRAPH  Comparison:  Previous exams  Patient presents for needle localization prior to left lumpectomy. I met with the patient and we discussed the procedure of needle localization including benefits and alternatives. We discussed the high likelihood of a successful procedure. We discussed the risks of the procedure, including infection, bleeding, tissue injury, and further surgery. Informed, written consent was given.  Using mammographic guidance, sterile technique, 2% lidocaine and a 7 cm modified Kopans needle, the clip localized using a lateral approach.  The films are marked for Dr. Carolynne Edouard.  Specimen radiograph was performed at [facility], and confirms the clip, residual calcifications and wire are present in the tissue sample.  The specimen is marked for pathology.  IMPRESSION: Needle localization left breast.  No apparent complications.  Original Report Authenticated By: Hiram Gash, M.D.   Mm Breast Wire Localization Left  10/18/2011  *RADIOLOGY REPORT*  Clinical Data:  Mucinous carcinoma, left breast.  NEEDLE LOCALIZATION WITH MAMMOGRAPHIC GUIDANCE AND SPECIMEN RADIOGRAPH  Comparison:  Previous exams  Patient presents for needle localization prior to left lumpectomy. I met with the patient and we discussed the procedure of needle localization including benefits and  alternatives. We discussed the high likelihood of a successful procedure. We discussed the risks of the procedure, including infection, bleeding, tissue injury, and further surgery. Informed, written consent was given.  Using mammographic guidance, sterile technique, 2% lidocaine and a 7 cm modified Kopans needle, the clip localized using a lateral approach.  The films are marked for Dr. Carolynne Edouard.  Specimen radiograph was performed at [facility], and confirms the clip, residual calcifications and wire are present in the tissue sample.  The specimen is marked for pathology.  IMPRESSION: Needle localization left breast.  No apparent complications.  Original Report Authenticated By: Hiram Gash, M.D.    ASSESSMENT: 67 year old female with:  #1 invasive ductal carcinoma of the left breast s/p left lumpectomy. The final path showed minimal focus of invasion with the remaining area primarily DCIS, ER+ PR+.  #2 patient completed radiation therapy as of November of 5 2013.  #3 Patient started on Tamoxifen in 02/2012.  She tolerates it moderately well, she does have difficulty with hot flashes.    PLAN:  #1 Patient is doing well, no sign of recurrence.  She will continue with Tamoxifen daily.  We discussed survivorship and updated her health maintenance.  She is due for a bone density exam and I have scheduled this today.    #2  We recommended acupuncture to help with her hot flashes.    #3 she will return in 6 months time for followup or sooner if need arises.  All questions were answered. The patient knows to call the clinic with any problems, questions or concerns. We can certainly see the patient much sooner if necessary.  I spent 25 minutes counseling the patient face to face. The total time spent in the appointment was 30 minutes.  Cherie Ouch Lyn Hollingshead, NP Medical Oncology [facility] Phone: [phone] 12/12/2012, 9:31 AM    ATTENDING'S ATTESTATION:  I personally  reviewed patient's chart, examined patient myself, formulated the treatment plan as followed.    Patient remains on tamoxifen without any problems she is experiencing hot flashes however. She continues to have significant anxiety. We again discussed ways of relieving this.For her hot flashes I have recommended acupuncture and this may also in fact relieve her anxiety. She will continue to see [place] every 6  months.  Drue Second, MD Medical/Oncology [facility] [phone] (beeper) [phone] (Office)  01/04/2013, 9:52 AM

## 2012-12-11 NOTE — Patient Instructions (Addendum)
Acupuncture Acupuncture began in [place] some 3,000 years ago. It has been widely used in [place] since the early 1900s to relieve pain. A growing number of doctors find this method to be useful but cannot medically explain how it works. Patients are asking about acupuncture more often, especially when standard treatments have not made their pain go away. The theory that, in part, acupuncture works through the release of brain neurotransmitters is gaining scientific support. Some evidence shows that acupuncture causes a release of endorphins in the brain. Endorphins are chemicals the body produces to feel good or to feel less pain. PROCEDURE To treat pain, caregivers choose exact points to place needles. Points are chosen from within the same nerve (neural) segment next to the area of pain. Points may also be chosen from the arms and legs where many muscles are located. One of the most effective acupuncture points, Hoku, is located at the base of the thumb. The thumb has the largest number of connections to the brain. TREATMENT   Acupuncture needles are placed deeply into a muscle. This causes changes in the central nervous system that make pain go away. The relief from pain lasts a long time.  Other forms of treatment such as moxibustion may be used at the acupuncture point. This is a form of heat therapy.  A variety of massage and movement techniques may also be used to relieve pain. National Headache Foundation (NHF) Document Released: 03/07/2003 Document Revised: 05/27/2011 Document Reviewed: 08/18/2008 [employer] Patient Information 2014 [employer].  

## 2012-12-21 ENCOUNTER — Telehealth: Payer: Self-pay | Admitting: Cardiovascular Disease

## 2012-12-21 NOTE — Telephone Encounter (Signed)
Want to know if we have samples of Zetia 10mg  samples that she could come and pick up .Marland Kitchen Please Calll...    Thanks

## 2012-12-22 MED ORDER — EZETIMIBE 10 MG PO TABS: 10.0000 mg | ORAL_TABLET | Freq: Every day | ORAL | Status: DC

## 2012-12-22 NOTE — Telephone Encounter (Signed)
Patient notified, samples will be left at the front desk. 4 packs of Zetia - [id number] EXP-02/2015

## 2012-12-24 ENCOUNTER — Ambulatory Visit: Payer: Medicare Other | Admitting: Radiation Oncology

## 2013-01-20 ENCOUNTER — Ambulatory Visit
Admission: RE | Admit: 2013-01-20 | Discharge: 2013-01-20 | Disposition: A | Discharge status: Home / Self Care | Payer: 59 | Source: Ambulatory Visit | Attending: Adult Health | Admitting: Adult Health

## 2013-01-20 DIAGNOSIS — E2839 Other primary ovarian failure: Secondary | ICD-10-CM

## 2013-01-20 DIAGNOSIS — C50412 Malignant neoplasm of upper-outer quadrant of left female breast: Secondary | ICD-10-CM

## 2013-01-29 ENCOUNTER — Telehealth: Payer: Self-pay | Admitting: Cardiovascular Disease

## 2013-01-29 MED ORDER — EZETIMIBE 10 MG PO TABS: 10.0000 mg | ORAL_TABLET | Freq: Every day | ORAL | Status: DC

## 2013-01-29 NOTE — Telephone Encounter (Signed)
Returned call and pt informed samples left at front desk.  Pt verbalized understanding and agreed w/ plan.    

## 2013-01-29 NOTE — Telephone Encounter (Signed)
Patient would like samples of Zetia 10 mg. 

## 2013-02-25 ENCOUNTER — Ambulatory Visit: Admission: RE | Admit: 2013-02-25 | Payer: Medicare Other | Source: Ambulatory Visit | Admitting: Radiation Oncology

## 2013-03-16 ENCOUNTER — Other Ambulatory Visit: Payer: Self-pay | Admitting: *Deleted

## 2013-03-16 MED ORDER — TAMOXIFEN CITRATE 20 MG PO TABS: 20.0000 mg | ORAL_TABLET | Freq: Every day | ORAL | Status: DC

## 2013-04-02 ENCOUNTER — Telehealth: Payer: Self-pay | Admitting: Cardiovascular Disease

## 2013-04-02 NOTE — Telephone Encounter (Signed)
Returned call.  Informed pt no samples available.  Pt verbalized understanding and agreed w/ plan.  Pt will call back next week to check if available.  Pt declined offer to send in script until next week.  Pt has Medicare and unable to use discount card.

## 2013-04-02 NOTE — Telephone Encounter (Signed)
Would like some samples of Zetia 10 mg please. °

## 2013-04-07 ENCOUNTER — Ambulatory Visit (HOSPITAL_BASED_OUTPATIENT_CLINIC_OR_DEPARTMENT_OTHER)
Admission: RE | Admit: 2013-04-07 | Discharge: 2013-04-07 | Disposition: A | Discharge status: Home / Self Care | Payer: Medicare Other | Source: Ambulatory Visit | Attending: Family Medicine | Admitting: Family Medicine

## 2013-04-07 ENCOUNTER — Other Ambulatory Visit (HOSPITAL_BASED_OUTPATIENT_CLINIC_OR_DEPARTMENT_OTHER): Payer: Self-pay | Admitting: Family Medicine

## 2013-04-07 DIAGNOSIS — M7989 Other specified soft tissue disorders: Secondary | ICD-10-CM | POA: Insufficient documentation

## 2013-04-07 DIAGNOSIS — M79609 Pain in unspecified limb: Secondary | ICD-10-CM

## 2013-04-27 ENCOUNTER — Telehealth: Payer: Self-pay | Admitting: Cardiovascular Disease

## 2013-04-27 MED ORDER — EZETIMIBE 10 MG PO TABS: 10.0000 mg | ORAL_TABLET | Freq: Every day | ORAL | Status: DC

## 2013-04-27 NOTE — Telephone Encounter (Signed)
Returned call and pt informed samples left at front desk.  Pt verbalized understanding and agreed w/ plan.    

## 2013-04-27 NOTE — Telephone Encounter (Signed)
Would like some samples of Zetia 10 mg please. °

## 2013-05-10 ENCOUNTER — Telehealth: Payer: Self-pay | Admitting: Oncology

## 2013-05-24 ENCOUNTER — Telehealth: Payer: Self-pay | Admitting: Cardiovascular Disease

## 2013-05-24 MED ORDER — EZETIMIBE 10 MG PO TABS: 10.0000 mg | ORAL_TABLET | Freq: Every day | ORAL | Status: DC

## 2013-05-24 NOTE — Telephone Encounter (Signed)
Returned call and pt informed samples left at front desk.  Pt verbalized understanding and agreed w/ plan.    

## 2013-05-24 NOTE — Telephone Encounter (Signed)
Would like samples of Zetia 10 mg please.

## 2013-06-07 ENCOUNTER — Telehealth: Payer: Self-pay | Admitting: Oncology

## 2013-06-10 ENCOUNTER — Telehealth: Payer: Self-pay | Admitting: Adult Health

## 2013-06-10 NOTE — Telephone Encounter (Signed)
, °

## 2013-06-11 ENCOUNTER — Encounter: Payer: Self-pay | Admitting: Adult Health

## 2013-06-11 ENCOUNTER — Other Ambulatory Visit (HOSPITAL_BASED_OUTPATIENT_CLINIC_OR_DEPARTMENT_OTHER): Payer: Medicare Other

## 2013-06-11 ENCOUNTER — Ambulatory Visit (HOSPITAL_BASED_OUTPATIENT_CLINIC_OR_DEPARTMENT_OTHER): Payer: Medicare Other | Admitting: Adult Health

## 2013-06-11 ENCOUNTER — Other Ambulatory Visit: Payer: Medicare Other

## 2013-06-11 ENCOUNTER — Ambulatory Visit: Payer: Medicare Other | Admitting: Adult Health

## 2013-06-11 VITALS — BP 137/70 | HR 82 | Temp 99.3°F | Resp 18 | Ht 64.0 in | Wt 143.2 lb

## 2013-06-11 DIAGNOSIS — M899 Disorder of bone, unspecified: Secondary | ICD-10-CM

## 2013-06-11 DIAGNOSIS — N951 Menopausal and female climacteric states: Secondary | ICD-10-CM

## 2013-06-11 DIAGNOSIS — M949 Disorder of cartilage, unspecified: Secondary | ICD-10-CM

## 2013-06-11 DIAGNOSIS — Z170 Estrogen receptor positive status [ER+]: Secondary | ICD-10-CM

## 2013-06-11 DIAGNOSIS — C50419 Malignant neoplasm of upper-outer quadrant of unspecified female breast: Secondary | ICD-10-CM

## 2013-06-11 DIAGNOSIS — C50412 Malignant neoplasm of upper-outer quadrant of left female breast: Secondary | ICD-10-CM

## 2013-06-11 LAB — COMPREHENSIVE METABOLIC PANEL (CC13)
ALK PHOS: 54 U/L (ref 40–150)
ALT: 13 U/L (ref 0–55)
AST: 13 U/L (ref 5–34)
Albumin: 3.8 g/dL (ref 3.5–5.0)
Anion Gap: 11 mEq/L (ref 3–11)
BILIRUBIN TOTAL: 0.29 mg/dL (ref 0.20–1.20)
BUN: 19 mg/dL (ref 7.0–26.0)
CO2: 28 mEq/L (ref 22–29)
Calcium: 9.6 mg/dL (ref 8.4–10.4)
Chloride: 101 mEq/L (ref 98–109)
Creatinine: 0.8 mg/dL (ref 0.6–1.1)
Glucose: 119 mg/dl (ref 70–140)
POTASSIUM: 3.1 meq/L — AB (ref 3.5–5.1)
SODIUM: 140 meq/L (ref 136–145)
TOTAL PROTEIN: 6.7 g/dL (ref 6.4–8.3)

## 2013-06-11 LAB — CBC WITH DIFFERENTIAL/PLATELET
BASO%: 0.6 % (ref 0.0–2.0)
Basophils Absolute: 0.1 10*3/uL (ref 0.0–0.1)
EOS%: 1.2 % (ref 0.0–7.0)
Eosinophils Absolute: 0.1 10*3/uL (ref 0.0–0.5)
HCT: 37.9 % (ref 34.8–46.6)
HGB: 12.9 g/dL (ref 11.6–15.9)
LYMPH%: 22.4 % (ref 14.0–49.7)
MCH: 32.3 pg (ref 25.1–34.0)
MCHC: 34 g/dL (ref 31.5–36.0)
MCV: 95 fL (ref 79.5–101.0)
MONO#: 0.7 10*3/uL (ref 0.1–0.9)
MONO%: 6.7 % (ref 0.0–14.0)
NEUT#: 6.8 10*3/uL — ABNORMAL HIGH (ref 1.5–6.5)
NEUT%: 69.1 % (ref 38.4–76.8)
PLATELETS: 279 10*3/uL (ref 145–400)
RBC: 3.99 10*6/uL (ref 3.70–5.45)
RDW: 13.4 % (ref 11.2–14.5)
WBC: 9.8 10*3/uL (ref 3.9–10.3)
lymph#: 2.2 10*3/uL (ref 0.9–3.3)

## 2013-06-11 NOTE — Progress Notes (Signed)
Hematology and Oncology Follow Up Visit  [name] [id number] 1946-03-11 68 y.o. 06/11/2013 10:07 AM     Principle Diagnosis:[name] 68 y.o. female with h/o stage I ER psoitive invasive ductal carcinoma of the left breast diagnosed in 2013.     Prior Therapy: # 1 S/p Lumpectomy with sentinel node biopsy with the final showing an ER+ T1aN0 invasive ductal carcinoma with DCIS   #2 patient is status post radiation therapy completed on 01/21/2012.   #3 Patient was started on Tamoxifen 20mg  daily beginning on 02/24/2012  Current therapy:  Tamoxifen 20mg  daily  Interim History: [name] 68 y.o. female with stage I ER positive invasive ductal carcinoma of the left breast.  She is here for f/u today.  She developed swelling in her right leg in January after kneeling down for a long period of time.  A doppler was done and negative.  Her PCP attempted to draw fluid off of her knee and gave her a steroid taper which has helped slightly.  She was also given a Toradol like injection and it helped as well.  She has f/u with cardiology soon.  She has yet to make the appointment.  She does get lower extremity swelling by the end of the day.  She has not exercised recently and has gained some weight due to her steroid taper. She was having difficulty at her last appointment with hot flashes.  Increasing her gabapentin at night has helped.  She continues to have hot flashes during the day, she does have vaginal dryness, and dry eyes.  She takes Restasis for her dry eyes.   Otherwise she is doing well and a 10 point ROS is negative.    Medications:  Current Outpatient Prescriptions  Medication Sig Dispense Refill  . BIOTIN PO Take 550 mg by mouth.      . calcium carbonate 200 MG capsule Take 250 mg by mouth 2 (two) times daily with a meal.      . diclofenac sodium (VOLTAREN) 1 % GEL Apply 1 application topically.      Marland Kitchen ezetimibe (ZETIA) 10 MG tablet Take 1 tablet (10 mg total) by mouth  daily.  28 tablet  0  . gabapentin (NEURONTIN) 300 MG capsule Take 1 capsule (300 mg total) by mouth at bedtime.  30 capsule  3  . ibuprofen (ADVIL,MOTRIN) 200 MG tablet Take 200 mg by mouth every 6 (six) hours as needed.      Marland Kitchen lisinopril-hydrochlorothiazide (ZESTORETIC) 10-12.5 MG per tablet Take 1 tablet by mouth daily.  90 tablet  3  . Multiple Vitamin (MULTIVITAMIN) tablet Take 1 tablet by mouth daily.      . nortriptyline (PAMELOR) 25 MG capsule       . Omega-3 Fatty Acids (FISH OIL PO) Take 1 each by mouth.      Marland Kitchen omeprazole (PRILOSEC) 20 MG capsule Take 20 mg by mouth daily.      . predniSONE (STERAPRED UNI-PAK) 10 MG tablet       . RESTASIS 0.05 % ophthalmic emulsion Apply 2 drops to eye daily.      . tamoxifen (NOLVADEX) 20 MG tablet Take 1 tablet (20 mg total) by mouth daily.  90 tablet  12  . tretinoin (RETIN-A) 0.05 % cream Apply topically at bedtime.      Marland Kitchen venlafaxine XR (EFFEXOR-XR) 75 MG 24 hr capsule Take 1 capsule (75 mg total) by mouth daily.  30 capsule  3  . ALPRAZolam Duanne Moron)  0.25 MG tablet Take 0.25 mg by mouth at bedtime as needed for sleep.      Marland Kitchen co-enzyme Q-10 30 MG capsule Take 300 mg by mouth daily.       No current facility-administered medications for this visit.     Allergies:  Allergies  Allergen Reactions  . Statins     Medical History: Past Medical History  Diagnosis Date  . IBS (irritable bowel syndrome)   . Arthritis   . Breast cancer 10/18/2011    dcis, left breast, ER/PR +  . GERD (gastroesophageal reflux disease)   . H/O colonoscopy   . H/O bone density study   . Night sweats   . Sinus complaint   . Heartburn   . Psoriasis   . Anxiety   . Depression   . Hot flashes   . Dysrhythmia     palpitations  . Hypertension   . History of radiation therapy 12/24/11 -01/21/12    left breast  . Hypertension   . Hyperlipidemia   . Abnormal C-reactive protein   . Breast cancer, stage 1     left    Surgical History:  Past Surgical History   Procedure Laterality Date  . Cataract extraction    . Tonsillectomy and adenoidectomy    . Dilation and curettage of uterus    . Abdominal hysterectomy    . Hand surgery      RSD  . Colonoscopy    . Upper gastrointestinal endoscopy    . Breast surgery  10/18/2011    LT br lumpectomy  . Nm myoview ltd  10/19/2008    normal     Review of Systems: A 10 point review of systems was conducted and is otherwise negative except for what is noted above.    Health Maintenance  Mammogram:  09/09/2012 Colonoscopy:  Due, recommended she schedule Bone Density Scan:  01/20/2013, osteopenia Pap Smear: s/p TAH, unsure if BSO Eye Exam: 08/2012 Lipid Panel: 08/2012    Physical Exam: Blood pressure 137/70, pulse 82, temperature 99.3 F (37.4 C), temperature source Oral, resp. rate 18, height 5\' 4"  (1.626 m), weight 143 lb 3.2 oz (64.955 kg). GENERAL: Patient is a well appearing female in no acute distress HEENT:  Sclerae anicteric.  Oropharynx clear and moist. No ulcerations or evidence of oropharyngeal candidiasis. Neck is supple.  NODES:  No cervical, supraclavicular, or axillary lymphadenopathy palpated.  BREAST EXAM:  Left lumpectomy site without nodularity.  Left breast without nodules, masses, skin changes, right breast without nodules, masses, skin changes, or nipple changes.  Benign breast exam.   LUNGS:  Clear to auscultation bilaterally.  No wheezes or rhonchi. HEART:  Regular rate and rhythm. No murmur appreciated. ABDOMEN:  Soft, nontender.  Positive, normoactive bowel sounds. No organomegaly palpated. MSK:  No focal spinal tenderness to palpation. Full range of motion bilaterally in the upper extremities. EXTREMITIES:  No peripheral edema.   SKIN:  Clear with no obvious rashes or skin changes. No nail dyscrasia. NEURO:  Nonfocal. Well oriented.  Appropriate affect. ECOG PERFORMANCE STATUS: 1 - Symptomatic but completely ambulatory   Lab Results: Lab Results  Component Value  Date   WBC 9.8 06/11/2013   HGB 12.9 06/11/2013   HCT 37.9 06/11/2013   MCV 95.0 06/11/2013   PLT 279 06/11/2013     Chemistry      Component Value Date/Time   NA 142 12/11/2012 0956   NA 137 10/17/2011 1502   K 3.9 12/11/2012 0956  K 3.2* 10/17/2011 1502   CL 103 06/08/2012 1407   CL 98 10/17/2011 1502   CO2 28 12/11/2012 0956   CO2 29 10/17/2011 1502   BUN 14.3 12/11/2012 0956   BUN 11 10/17/2011 1502   CREATININE 0.8 12/11/2012 0956   CREATININE 0.73 10/17/2011 1502      Component Value Date/Time   CALCIUM 9.2 12/11/2012 0956   CALCIUM 9.2 10/17/2011 1502   ALKPHOS 54 12/11/2012 0956   ALKPHOS 73 10/02/2011 1603   AST 13 12/11/2012 0956   AST 15 10/02/2011 1603   ALT 8 12/11/2012 0956   ALT 10 10/02/2011 1603   BILITOT 0.36 12/11/2012 0956   BILITOT 0.2* 10/02/2011 1603      Assessment and Plan: [name] 68 y.o. female with  1. H/o ER positive stage I invasive ductal carcinoma of the left breast.  She is taking tamoxifen daily and doing moderately well with this.  She will continue this We updated her health maintenance above.  I recommended she get an appointment for a colonoscopy and undergo her eye exam, lipid monitoring, and mammogram when due.  We discussed survivorship in detail, including healthy diet, exercise, and monthly self breast exams.  Her CBC is stable, I reviewed this with her in detail.  Her CMP is pending.   2.  Vaginal dryness: recommended the patient use coconut oil as a lubricant.  I also gave her our sexuality booklet.  3.  Hot flashes: night time hot flashes improved with increased Gabapentin dose.  I recommended she take Vitamin E during the day.    Patient is doing well and will return to see [place] in 6 months for labs and evaluation.  She knows to call us in the interim if she has any questions or concerns whatsoever.    I spent 25 minutes counseling the patient face to face.  The total time spent in the appointment was 30 minutes.  Minette Headland, [place] [phone] 06/11/2013 10:07 AM

## 2013-06-11 NOTE — Patient Instructions (Signed)
You are doing well and have no sign of recurrence.  Continue taking your tamoxifen.  Try taking Vitamin E daily as it may help with the hot flashes.  Schedule a colonoscopy.  I recommend health diet and exercise daily, along with monthly self breast exams.  Try coconut oil for your dryness.  Have your mammogram, lipids checked, and eye exam in June when they are due.  We will see you back in 6 months.  Please call us if you have any questions or concerns.

## 2013-06-15 ENCOUNTER — Telehealth: Payer: Self-pay | Admitting: Oncology

## 2013-06-15 NOTE — Telephone Encounter (Signed)
lvm for pt regarding to OCT appts....mailed pt appt sched/avs and letter °

## 2013-06-16 ENCOUNTER — Other Ambulatory Visit: Payer: 59

## 2013-06-16 ENCOUNTER — Ambulatory Visit: Payer: Self-pay | Admitting: Adult Health

## 2013-06-21 ENCOUNTER — Telehealth: Payer: Self-pay | Admitting: Cardiovascular Disease

## 2013-06-21 MED ORDER — EZETIMIBE 10 MG PO TABS: 10.0000 mg | ORAL_TABLET | Freq: Every day | ORAL | Status: DC

## 2013-06-21 NOTE — Telephone Encounter (Signed)
Returned call and pt informed samples left at front desk.  Pt verbalized understanding and agreed w/ plan.   Pt also c/o bilateral feet swelling.  Denied DVT symptoms and SOB.  Advice: Elevate LEs above level of the heart, Monitor Na+ intake, wear otc support socks or find compression stockings and call back if no improvement in 48 hrs.  Pt verbalized understanding and agreed w/ plan.  Pt wanted to call her insurance company to make sure they will cover her appt w/ Dr. Loletha Grayer in May or sooner appt if needed.

## 2013-06-21 NOTE — Telephone Encounter (Signed)
Wanting to know if she can get samples of Zetia 10mg  .. Please Call    Thanks

## 2013-07-28 ENCOUNTER — Encounter: Payer: Self-pay | Admitting: Cardiovascular Disease

## 2013-07-28 ENCOUNTER — Ambulatory Visit (INDEPENDENT_AMBULATORY_CARE_PROVIDER_SITE_OTHER): Payer: 59 | Admitting: Cardiovascular Disease

## 2013-07-28 VITALS — BP 120/58 | HR 86 | Resp 16 | Ht 64.0 in | Wt 142.8 lb

## 2013-07-28 DIAGNOSIS — E785 Hyperlipidemia, unspecified: Secondary | ICD-10-CM

## 2013-07-28 NOTE — Patient Instructions (Signed)
STOP THE ZETIA.  Dr. Sallyanne Kuster recommends that you schedule a follow-up appointment in: AS NEEDED.

## 2013-07-28 NOTE — Progress Notes (Signed)
Patient ID: [name], female   DOB: [birth year], 68 y.o.   MRN: [id number]     Reason for office visit HTN, coronary risk factors  Roughly one year has passed since this is [place] successfully underwent a lumpectomy and radiation therapy for breast cancer. At this point she has no evidence of any residual disease. Her blood pressure is under excellent control. Her major complaint is related to prominent hot flashes on treatment with tamoxifen for her estrogen receptor positive tumor.  She had been started on treatment with lipid-lowering agents since CRP levels were high and she had a family history of coronary problems. Retrospectively her elevated CRP levels were more likely related to breast cancer. Her lipid profile is actually quite favorable with an HDL of 84 and LDL of 81. She's currently taking Zetia, but has problems with the cost of this medication. She did not tolerate statins. She is eating a healthy diet and her weight is in the desirable range. She has no symptoms of active cardiac/coronary disease.   Allergies  Allergen Reactions  . Statins     Current Outpatient Prescriptions  Medication Sig Dispense Refill  . metroNIDAZOLE (METROGEL) 0.75 % gel Apply 1 application topically daily.      Marland Kitchen ALPRAZolam (XANAX) 0.25 MG tablet Take 0.25 mg by mouth at bedtime as needed for sleep.      Marland Kitchen BIOTIN PO Take 550 mg by mouth.      . diclofenac sodium (VOLTAREN) 1 % GEL Apply 1 application topically.      . gabapentin (NEURONTIN) 300 MG capsule Take 1 capsule (300 mg total) by mouth at bedtime.  30 capsule  3  . ibuprofen (ADVIL,MOTRIN) 200 MG tablet Take 200 mg by mouth every 6 (six) hours as needed.      Marland Kitchen lisinopril-hydrochlorothiazide (ZESTORETIC) 10-12.5 MG per tablet Take 1 tablet by mouth daily.  90 tablet  3  . Multiple Vitamin (MULTIVITAMIN) tablet Take 1 tablet by mouth daily.      . nortriptyline (PAMELOR) 25 MG capsule       . Omega-3 Fatty Acids (FISH OIL PO) Take 1  each by mouth.      Marland Kitchen omeprazole (PRILOSEC) 20 MG capsule Take 20 mg by mouth daily.      . predniSONE (STERAPRED UNI-PAK) 10 MG tablet       . RESTASIS 0.05 % ophthalmic emulsion Apply 2 drops to eye daily.      . tamoxifen (NOLVADEX) 20 MG tablet Take 1 tablet (20 mg total) by mouth daily.  90 tablet  12  . tretinoin (RETIN-A) 0.05 % cream Apply topically at bedtime.      Marland Kitchen venlafaxine XR (EFFEXOR-XR) 75 MG 24 hr capsule Take 1 capsule (75 mg total) by mouth daily.  30 capsule  3   No current facility-administered medications for this visit.    Past Medical History  Diagnosis Date  . IBS (irritable bowel syndrome)   . Arthritis   . Breast cancer 10/18/2011    dcis, left breast, ER/PR +  . GERD (gastroesophageal reflux disease)   . H/O colonoscopy   . H/O bone density study   . Night sweats   . Sinus complaint   . Heartburn   . Psoriasis   . Anxiety   . Depression   . Hot flashes   . Dysrhythmia     palpitations  . Hypertension   . History of radiation therapy 12/24/11 -01/21/12    left breast  .  Hypertension   . Hyperlipidemia   . Abnormal C-reactive protein   . Breast cancer, stage 1     left    Past Surgical History  Procedure Laterality Date  . Cataract extraction    . Tonsillectomy and adenoidectomy    . Dilation and curettage of uterus    . Abdominal hysterectomy    . Hand surgery      RSD  . Colonoscopy    . Upper gastrointestinal endoscopy    . Breast surgery  10/18/2011    LT br lumpectomy  . Nm myoview ltd  10/19/2008    normal    Family History  Problem Relation Age of Onset  . Cancer Paternal Grandmother     "questionable breast cancer"  . Cancer Cousin     maternal; colon ca  . Heart attack Father   . Hyperlipidemia Mother   . Heart attack Brother   . Hypertension Brother   . Hyperlipidemia Brother     History   Social History  . Marital Status: Married    Spouse Name: N/A    Number of Children: N/A  . Years of Education: N/A    Occupational History  . Not on file.   Social History Main Topics  . Smoking status: Former Smoker -- 1.00 packs/day    Types: Cigarettes    Quit date: 10/14/1981  . Smokeless tobacco: Never Used  . Alcohol Use: 4.2 oz/week    7 Glasses of wine per week  . Drug Use: No  . Sexual Activity: Yes   Other Topics Concern  . Not on file   Social History Narrative  . No narrative on file    Review of systems: The patient specifically denies any chest pain at rest or with exertion, dyspnea at rest or with exertion, orthopnea, paroxysmal nocturnal dyspnea, syncope, palpitations, focal neurological deficits, intermittent claudication, lower extremity edema, unexplained weight gain, cough, hemoptysis or wheezing.  The patient also denies abdominal pain, nausea, vomiting, dysphagia, diarrhea, constipation, polyuria, polydipsia, dysuria, hematuria, frequency, urgency, abnormal bleeding or bruising, fever, chills, unexpected weight changes, mood swings, change in skin or hair texture, change in voice quality, auditory or visual problems, allergic reactions or rashes, new musculoskeletal complaints other than usual "aches and pains". She has hot flashes and some pain in her left nipple   PHYSICAL EXAM BP 120/58  Pulse 86  Resp 16  Ht 5\' 4"  (1.626 m)  Wt 142 lb 12.8 oz (64.774 kg)  BMI 24.50 kg/m2  General: Alert, oriented x3, no distress Head: no evidence of trauma, PERRL, EOMI, no exophtalmos or lid lag, no myxedema, no xanthelasma; normal ears, nose and oropharynx Neck: normal jugular venous pulsations and no hepatojugular reflux; brisk carotid pulses without delay and no carotid bruits Chest: clear to auscultation, no signs of consolidation by percussion or palpation, normal fremitus, symmetrical and full respiratory excursions Cardiovascular: normal position and quality of the apical impulse, regular rhythm, normal first and second heart sounds, no murmurs, rubs or gallops Abdomen: no  tenderness or distention, no masses by palpation, no abnormal pulsatility or arterial bruits, normal bowel sounds, no hepatosplenomegaly Extremities: no clubbing, cyanosis or edema; 2+ radial, ulnar and brachial pulses bilaterally; 2+ right femoral, posterior tibial and dorsalis pedis pulses; 2+ left femoral, posterior tibial and dorsalis pedis pulses; no subclavian or femoral bruits Neurological: grossly nonfocal   EKG: Normal sinus rhythm, questionable left atrial abnormality, otherwise normal   BMET    Component Value Date/Time   NA  140 06/11/2013 0926   NA 137 10/17/2011 1502   K 3.1* 06/11/2013 0926   K 3.2* 10/17/2011 1502   CL 103 06/08/2012 1407   CL 98 10/17/2011 1502   CO2 28 06/11/2013 0926   CO2 29 10/17/2011 1502   GLUCOSE 119 06/11/2013 0926   GLUCOSE 99 06/08/2012 1407   GLUCOSE 102* 10/17/2011 1502   BUN 19.0 06/11/2013 0926   BUN 11 10/17/2011 1502   CREATININE 0.8 06/11/2013 0926   CREATININE 0.73 10/17/2011 1502   CALCIUM 9.6 06/11/2013 0926   CALCIUM 9.2 10/17/2011 1502   GFRNONAA 88* 10/17/2011 1502   GFRAA >90 10/17/2011 1502     ASSESSMENT AND PLAN At this point, Mrs. Lachapelle has excellent blood pressure control and no adjustments appear to be necessary to her antihypertensive regimen. Low serum potassium levels were noted earlier this year and she is encouraged to eat more potassium rich fruits and vegetables. I think there is only scanty evidence that Zetia is beneficial, especially for primary prevention. Her LDL and HDL levels are actually quite good and I would not recommend continued lipid lowering therapy. She is encouraged to continue with a healthy lifestyle and maintain her weight the normal range. It is reasonable to repeat her CRP level. I suspect it'll be much lower. Cardiology followup will be as needed Orders Placed This Encounter  Procedures  . EKG 12-Lead   Meds ordered this encounter  Medications  . metroNIDAZOLE (METROGEL) 0.75 % gel    Sig: Apply 1  application topically daily.    Renne Platts  Sanda Klein, MD, [facility] [phone] office [phone] pager

## 2013-08-17 ENCOUNTER — Other Ambulatory Visit (HOSPITAL_BASED_OUTPATIENT_CLINIC_OR_DEPARTMENT_OTHER): Payer: Self-pay | Admitting: Family Medicine

## 2013-08-17 DIAGNOSIS — M25569 Pain in unspecified knee: Secondary | ICD-10-CM

## 2013-08-19 ENCOUNTER — Telehealth: Payer: Self-pay | Admitting: *Deleted

## 2013-08-19 NOTE — Telephone Encounter (Signed)
Patient called in concerned about a tender area of her right breast that she would like advice on what to do. Instructed patient to call [facility] and make arrangements for her annual diagnostic mammogram and if they detect anything they will take the steps needed to get her back to [place]. Patient very appreciative for call and felt more relieved. Informed patient that Dr Humphrey Rolls also not at our practice anymore and that she will expect a call or letter from [place] for change of her attending MD. Assured her if she needs anything she is to still call us anytime.

## 2013-08-20 ENCOUNTER — Other Ambulatory Visit: Payer: Self-pay | Admitting: Adult Health

## 2013-08-20 ENCOUNTER — Ambulatory Visit
Admission: RE | Admit: 2013-08-20 | Discharge: 2013-08-20 | Disposition: A | Discharge status: Home / Self Care | Payer: Medicare Other | Source: Ambulatory Visit | Attending: Adult Health | Admitting: Adult Health

## 2013-08-20 ENCOUNTER — Encounter (INDEPENDENT_AMBULATORY_CARE_PROVIDER_SITE_OTHER): Payer: Self-pay

## 2013-08-20 DIAGNOSIS — N63 Unspecified lump in unspecified breast: Secondary | ICD-10-CM

## 2013-08-20 DIAGNOSIS — Z853 Personal history of malignant neoplasm of breast: Secondary | ICD-10-CM

## 2013-08-20 DIAGNOSIS — N644 Mastodynia: Secondary | ICD-10-CM

## 2013-08-21 ENCOUNTER — Ambulatory Visit (HOSPITAL_BASED_OUTPATIENT_CLINIC_OR_DEPARTMENT_OTHER): Payer: 59

## 2013-08-30 ENCOUNTER — Ambulatory Visit (INDEPENDENT_AMBULATORY_CARE_PROVIDER_SITE_OTHER): Payer: BC Managed Care – PPO | Admitting: General Surgery

## 2013-08-31 ENCOUNTER — Ambulatory Visit (HOSPITAL_BASED_OUTPATIENT_CLINIC_OR_DEPARTMENT_OTHER)
Admission: RE | Admit: 2013-08-31 | Discharge: 2013-08-31 | Disposition: A | Discharge status: Home / Self Care | Payer: Medicare Other | Source: Ambulatory Visit | Attending: Family Medicine | Admitting: Family Medicine

## 2013-08-31 DIAGNOSIS — M7120 Synovial cyst of popliteal space [Baker], unspecified knee: Secondary | ICD-10-CM | POA: Insufficient documentation

## 2013-08-31 DIAGNOSIS — X58XXXA Exposure to other specified factors, initial encounter: Secondary | ICD-10-CM | POA: Insufficient documentation

## 2013-08-31 DIAGNOSIS — M25569 Pain in unspecified knee: Secondary | ICD-10-CM

## 2013-08-31 DIAGNOSIS — S83289A Other tear of lateral meniscus, current injury, unspecified knee, initial encounter: Secondary | ICD-10-CM | POA: Insufficient documentation

## 2013-09-03 ENCOUNTER — Telehealth: Payer: Self-pay | Admitting: Oncology

## 2013-09-03 NOTE — Telephone Encounter (Signed)
, °

## 2013-09-10 ENCOUNTER — Encounter (INDEPENDENT_AMBULATORY_CARE_PROVIDER_SITE_OTHER): Payer: Self-pay | Admitting: General Surgery

## 2013-09-10 ENCOUNTER — Ambulatory Visit (INDEPENDENT_AMBULATORY_CARE_PROVIDER_SITE_OTHER): Payer: Medicare Other | Admitting: General Surgery

## 2013-09-10 VITALS — BP 124/73 | HR 76 | Temp 98.1°F | Resp 18 | Ht 64.0 in | Wt 143.0 lb

## 2013-09-10 DIAGNOSIS — C50412 Malignant neoplasm of upper-outer quadrant of left female breast: Secondary | ICD-10-CM

## 2013-09-10 DIAGNOSIS — C50419 Malignant neoplasm of upper-outer quadrant of unspecified female breast: Secondary | ICD-10-CM

## 2013-09-10 NOTE — Patient Instructions (Signed)
Continue tamoxifen Continue regular self exams

## 2013-10-11 NOTE — Progress Notes (Signed)
Subjective:     Patient ID: [name], female   DOB: March 31, 1945, 68 y.o.   MRN: [id number]  HPI The patient is a 68 year old white female who is one half years status post left breast lumpectomy for a T1 MIC N0 left breast cancer. She is taking tamoxifen and tolerating it well. Her last mammogram was in June that showed no evidence of malignancy. Her main complaints are of neck and knee pain  Review of Systems  Constitutional: Negative.   HENT: Negative.   Eyes: Negative.   Respiratory: Negative.   Cardiovascular: Negative.   Gastrointestinal: Negative.   Endocrine: Negative.   Genitourinary: Negative.   Musculoskeletal: Positive for arthralgias.  Skin: Negative.   Allergic/Immunologic: Negative.   Neurological: Negative.   Hematological: Negative.   Psychiatric/Behavioral: Negative.        Objective:   Physical Exam  Constitutional: She is oriented to person, place, and time. She appears well-developed and well-nourished.  HENT:  Head: Normocephalic and atraumatic.  Eyes: Conjunctivae and EOM are normal. Pupils are equal, round, and reactive to light.  Neck: Normal range of motion. Neck supple.  Cardiovascular: Normal rate, regular rhythm and normal heart sounds.   Pulmonary/Chest: Effort normal and breath sounds normal.  There is no palpable mass in either breast. There is no palpable axillary, supraclavicular, or cervical lymphadenopathy.  Abdominal: Soft. Bowel sounds are normal.  Musculoskeletal: Normal range of motion.  Lymphadenopathy:    She has no cervical adenopathy.  Neurological: She is alert and oriented to person, place, and time.  Skin: Skin is warm and dry.  Psychiatric: She has a normal mood and affect. Her behavior is normal.       Assessment:     The patient is 1-1/2 years status post left breast lumpectomy for a microscopically invasive breast cancer     Plan:     At this point she will continue to take tamoxifen. She will continue to do  regular self exams. I will plan to see her back in about 6 months.

## 2013-10-18 ENCOUNTER — Telehealth: Payer: Self-pay | Admitting: *Deleted

## 2013-10-18 NOTE — Telephone Encounter (Signed)
Pt left message stating she " found a little place that has me concerned ".  Return call requested with given number of [phone].  This RN was able to speak with pt per her concern.  Shauntia states she has found on area in her right breast " that is more like a nodule ".  " when I was diagnosis ed with cancer it was more like grizzle " " I feel Megumi bad for calling because I just had an U/S several weeks ago about other areas of concern "  Per further inquiry by this RN - pt was able to state nodule is " smaller then a pea and on the inside of my breast " Pt was able to verify area as " 4 oclock " position per discussion.  She noticed the area late last week after visit with Dr Marlou Starks.  This note with pt's recent mammo/U/S will be given to MD for review and appropriate recommendation.

## 2013-10-20 ENCOUNTER — Encounter: Payer: Medicare Other | Admitting: Nurse Practitioner

## 2013-10-21 ENCOUNTER — Encounter: Payer: Self-pay | Admitting: Nurse Practitioner

## 2013-10-21 NOTE — Progress Notes (Addendum)
ID: [name] OB: 12/10/1945  MR#: [id number]  CSN#:[id number]  PCP: HEPLER,MARK, PA-C GYN:   SU:  OTHER MD:  CHIEF COMPLAINT:  Left breast invasive ductal carcinoma  CURRENT THERAPY Tamoxifen 20mg daily  BREAST CANCER HISTORY: Take from 10/02/2011 progress note from Dr. Khan: "[name] recently presented for an annual screening mammogram on 09/10/2011. On this mammogram patient was found to have a new small cluster of pleomorphic microcalcifications located within the upper outer quadrant of the left breast measuring 3 mm. Because of this biopsy was recommended. Patient went on to have a core needle biopsy performed. The pathology showed invasive ductal carcinoma with mucinous features atypical ductal hyperplasia with calcifications and fibrocystic changes with calcifications. Prognostic markers could not be obtained to 2 small sample size. Patient went on to have MRI of the breasts performed on 09/30/2011. The MRI showed foci of nonspecific enhancement bilaterally there was about biopsy clip and a 1.4 cm postbiopsy hematoma in the upper outer quadrant of the left breast posteriorly no significant residual enhancement was seen in the upper outer quadrant of the left breast no suspicious mass or enhancement in either breast no axillary or internal mammary adenopathy." Her subsequent history is as follows.  INTERVAL HISTORY: [name] 68 y.o. female with stage I ER positive invasive ductal carcinoma of the left breast. She is here for examination of her right breast. Last Thursday she found a spot at 5 o'clock immediately right of the sternum that was concerning for her. Her last mammogram was on 08/20/2013 and was normal. A follow up ultrasound was performed in the upper right breast because of a "grissle" like palpable soft tissue the patient was also concerned about. The ultrasound revealed it was normal fat lobules. She is tolerating the tamoxifen well besides occasional hot  flashes. She offers no other complaints  REVIEW OF SYSTEMS: A detailed review of systems was conducted and is otherwise negative except for what is noted above.  PAST MEDICAL HISTORY: Past Medical History  Diagnosis Date  . IBS (irritable bowel syndrome)   . Arthritis   . Breast cancer 10/18/2011    dcis, left breast, ER/PR +  . GERD (gastroesophageal reflux disease)   . H/O colonoscopy   . H/O bone density study   . Night sweats   . Sinus complaint   . Heartburn   . Psoriasis   . Anxiety   . Depression   . Hot flashes   . Dysrhythmia     palpitations  . Hypertension   . History of radiation therapy 12/24/11 -01/21/12    left breast  . Hypertension   . Hyperlipidemia   . Abnormal C-reactive protein   . Breast cancer, stage 1     left    PAST SURGICAL HISTORY: Past Surgical History  Procedure Laterality Date  . Cataract extraction    . Tonsillectomy and adenoidectomy    . Dilation and curettage of uterus    . Abdominal hysterectomy    . Hand surgery      RSD  . Colonoscopy    . Upper gastrointestinal endoscopy    . Breast surgery  10/18/2011    LT br lumpectomy  . Nm myoview ltd  10/19/2008    normal  . Eye surgery      FAMILY HISTORY Family History  Problem Relation Age of Onset  . Cancer Paternal Grandmother     "questionable breast cancer"  . Cancer Cousin     maternal; colon ca  .   Heart attack Father   . Hyperlipidemia Mother   . Heart attack Brother   . Hypertension Brother   . Hyperlipidemia Brother     GYNECOLOGIC HISTORY: TAH in past, unsure if BSO was performed as well  SOCIAL HISTORY: deferred. Needs updating when patient returns.     ADVANCED DIRECTIVES: deferred. Nees updating when patient returns   HEALTH MAINTENANCE: History  Substance Use Topics  . Smoking status: Former Smoker -- 1.00 packs/day    Types: Cigarettes    Quit date: 10/14/1981  . Smokeless tobacco: Never Used  . Alcohol Use: 4.2 oz/week    7 Glasses of wine per  week   Mammogram: 08/20/2013 Colonoscopy: Due, recommended she schedule Bone Density Scan: 01/20/2013, osteopenia Pap Smear: s/p TAH, unsure if BSO Lipid Panel: 08/2012   Allergies  Allergen Reactions  . Statins   . Tape Hives    Current Outpatient Prescriptions  Medication Sig Dispense Refill  . ALPRAZolam (XANAX) 0.25 MG tablet Take 0.25 mg by mouth at bedtime as needed for sleep.      Marland Kitchen BIOTIN PO Take 550 mg by mouth.      . diclofenac sodium (VOLTAREN) 1 % GEL Apply 1 application topically.      . gabapentin (NEURONTIN) 300 MG capsule Take 1 capsule (300 mg total) by mouth at bedtime.  30 capsule  3  . ibuprofen (ADVIL,MOTRIN) 200 MG tablet Take 200 mg by mouth every 6 (six) hours as needed.      Marland Kitchen lisinopril-hydrochlorothiazide (ZESTORETIC) 10-12.5 MG per tablet Take 1 tablet by mouth daily.  90 tablet  3  . metroNIDAZOLE (METROGEL) 0.75 % gel Apply 1 application topically daily.      . Multiple Vitamin (MULTIVITAMIN) tablet Take 1 tablet by mouth daily.      . nortriptyline (PAMELOR) 25 MG capsule       . Omega-3 Fatty Acids (FISH OIL PO) Take 1 each by mouth.      Marland Kitchen omeprazole (PRILOSEC) 20 MG capsule Take 20 mg by mouth daily.      . predniSONE (STERAPRED UNI-PAK) 10 MG tablet       . RESTASIS 0.05 % ophthalmic emulsion Apply 2 drops to eye daily.      . tamoxifen (NOLVADEX) 20 MG tablet Take 1 tablet (20 mg total) by mouth daily.  90 tablet  12  . tretinoin (RETIN-A) 0.05 % cream Apply topically at bedtime.      Marland Kitchen venlafaxine XR (EFFEXOR-XR) 75 MG 24 hr capsule Take 1 capsule (75 mg total) by mouth daily.  30 capsule  3   No current facility-administered medications for this visit.    OBJECTIVE: There were no vitals filed for this visit.   There is no weight on file to calculate BMI.   ROS   Physical Exam Skin: warm, dry  HEENT: sclerae anicteric, conjunctivae pink, oropharynx clear. No thrush or mucositis.  Lymph Nodes: No cervical or supraclavicular  lymphadenopathy  Lungs: clear to auscultation bilaterally, no rales, wheezes, or rhonci  Heart: regular rate and rhythm  Abdomen: round, soft, non tender, positive bowel sounds  Musculoskeletal: No focal spinal tenderness, no peripheral edema  Neuro: non focal, well oriented, positive affect  Breasts: left breast status post lumpectomy and radiation. No evidence of recurrent disease, left and right axilla benign. Mild palpable soft tissue thickening in upper right breast. Lower right breast has pimple like elevation, black head noted at 5 o'clock.   ECOG FS:0 - Asymptomatic  LAB RESULTS: No  visits with results within 1 Month(s) from this visit. Latest known visit with results is:  Appointment on 06/11/2013  Component Date Value Ref Range Status  . WBC 06/11/2013 9.8  3.9 - 10.3 10e3/uL Final  . NEUT# 06/11/2013 6.8* 1.5 - 6.5 10e3/uL Final  . HGB 06/11/2013 12.9  11.6 - 15.9 g/dL Final  . HCT 06/11/2013 37.9  34.8 - 46.6 % Final  . Platelets 06/11/2013 279  145 - 400 10e3/uL Final  . MCV 06/11/2013 95.0  79.5 - 101.0 fL Final  . MCH 06/11/2013 32.3  25.1 - 34.0 pg Final  . MCHC 06/11/2013 34.0  31.5 - 36.0 g/dL Final  . RBC 06/11/2013 3.99  3.70 - 5.45 10e6/uL Final  . RDW 06/11/2013 13.4  11.2 - 14.5 % Final  . lymph# 06/11/2013 2.2  0.9 - 3.3 10e3/uL Final  . MONO# 06/11/2013 0.7  0.1 - 0.9 10e3/uL Final  . Eosinophils Absolute 06/11/2013 0.1  0.0 - 0.5 10e3/uL Final  . Basophils Absolute 06/11/2013 0.1  0.0 - 0.1 10e3/uL Final  . NEUT% 06/11/2013 69.1  38.4 - 76.8 % Final  . LYMPH% 06/11/2013 22.4  14.0 - 49.7 % Final  . MONO% 06/11/2013 6.7  0.0 - 14.0 % Final  . EOS% 06/11/2013 1.2  0.0 - 7.0 % Final  . BASO% 06/11/2013 0.6  0.0 - 2.0 % Final  . Sodium 06/11/2013 140  136 - 145 mEq/L Final  . Potassium 06/11/2013 3.1* 3.5 - 5.1 mEq/L Final  . Chloride 06/11/2013 101  98 - 109 mEq/L Final  . CO2 06/11/2013 28  22 - 29 mEq/L Final  . Glucose 06/11/2013 119  70 - 140 mg/dl  Final  . BUN 06/11/2013 19.0  7.0 - 26.0 mg/dL Final  . Creatinine 06/11/2013 0.8  0.6 - 1.1 mg/dL Final  . Total Bilirubin 06/11/2013 0.29  0.20 - 1.20 mg/dL Final  . Alkaline Phosphatase 06/11/2013 54  40 - 150 U/L Final  . AST 06/11/2013 13  5 - 34 U/L Final  . ALT 06/11/2013 13  0 - 55 U/L Final  . Total Protein 06/11/2013 6.7  6.4 - 8.3 g/dL Final  . Albumin 06/11/2013 3.8  3.5 - 5.0 g/dL Final  . Calcium 06/11/2013 9.6  8.4 - 10.4 mg/dL Final  . Anion Gap 06/11/2013 11  3 - 11 mEq/L Final    Urinalysis No results found for this basename: colorurine, appearanceur, labspec, phurine, glucoseu, hgbur, bilirubinur, ketonesur, proteinur, urobilinogen, nitrite, leukocytesur    STUDIES: Most recent mammogram on 08/20/2013 showed no evidence of malignancy.  ASSESSMENT: 68 y.o. [place] woman  (1) status post left breast lumpectomy on 10/18/2011, positive for T1aN0, stage 1A, invasive ductal carcinoma, ER 99% positive, PR 100% positive, Her-2/neu negative, margins clean  (2) radiation therapy completed 12/21/2011  (3) adjuvant antiestrogen therapy started 12/09/12013 with Tamoxifen   PLAN: [name] is doing well as far as her breast cancer is concerned. The right breast area causing her concern was also examined by Lindsey Cornetto. We agreed the bump is pimple like in nature and has a blackhead forming. The patient was encouraged to apply warm compresses to the site and allow the area to come to a head naturally.   [name] understands and agrees with the plan. She will return in Oct to visit with Dr. Magrinat for labs and an office visit. She was encouraged to call with any issues that arise before her next visit.    Ferrell, Heather L, NP  10/21/2013   9:17 AM    

## 2013-10-22 ENCOUNTER — Other Ambulatory Visit: Payer: Self-pay | Admitting: Oncology

## 2013-10-29 NOTE — Progress Notes (Signed)
This encounter was created in error - please disregard.

## 2013-11-16 ENCOUNTER — Other Ambulatory Visit: Payer: Self-pay

## 2013-11-16 MED ORDER — LISINOPRIL-HYDROCHLOROTHIAZIDE 10-12.5 MG PO TABS: 1.0000 | ORAL_TABLET | Freq: Every day | ORAL | Status: AC

## 2013-11-16 NOTE — Telephone Encounter (Signed)
Rx was sent to pharmacy electronically. 

## 2013-11-30 ENCOUNTER — Telehealth: Payer: Self-pay | Admitting: Oncology

## 2013-11-30 NOTE — Telephone Encounter (Signed)
Pt called to r/s earlier apt, having complications right side of chest and is concerned. KJ

## 2013-12-01 ENCOUNTER — Other Ambulatory Visit: Payer: Self-pay | Admitting: Emergency Medicine

## 2013-12-01 DIAGNOSIS — C50419 Malignant neoplasm of upper-outer quadrant of unspecified female breast: Secondary | ICD-10-CM

## 2013-12-02 ENCOUNTER — Ambulatory Visit (HOSPITAL_BASED_OUTPATIENT_CLINIC_OR_DEPARTMENT_OTHER): Payer: Medicare Other | Admitting: Oncology

## 2013-12-02 ENCOUNTER — Other Ambulatory Visit (HOSPITAL_BASED_OUTPATIENT_CLINIC_OR_DEPARTMENT_OTHER): Payer: Medicare Other

## 2013-12-02 VITALS — BP 134/67 | HR 73 | Temp 98.0°F | Resp 18 | Ht 64.0 in | Wt 143.8 lb

## 2013-12-02 DIAGNOSIS — M899 Disorder of bone, unspecified: Secondary | ICD-10-CM

## 2013-12-02 DIAGNOSIS — M949 Disorder of cartilage, unspecified: Secondary | ICD-10-CM

## 2013-12-02 DIAGNOSIS — Z170 Estrogen receptor positive status [ER+]: Secondary | ICD-10-CM

## 2013-12-02 DIAGNOSIS — C50412 Malignant neoplasm of upper-outer quadrant of left female breast: Secondary | ICD-10-CM

## 2013-12-02 DIAGNOSIS — G9050 Complex regional pain syndrome I, unspecified: Secondary | ICD-10-CM | POA: Insufficient documentation

## 2013-12-02 DIAGNOSIS — C50419 Malignant neoplasm of upper-outer quadrant of unspecified female breast: Secondary | ICD-10-CM

## 2013-12-02 LAB — COMPREHENSIVE METABOLIC PANEL
ALK PHOS: 45 U/L (ref 39–117)
ALT: 14 U/L (ref 0–35)
AST: 17 U/L (ref 0–37)
Albumin: 3.8 g/dL (ref 3.5–5.2)
BILIRUBIN TOTAL: 0.3 mg/dL (ref 0.2–1.2)
BUN: 14 mg/dL (ref 6–23)
CO2: 27 mEq/L (ref 19–32)
Calcium: 8.7 mg/dL (ref 8.4–10.5)
Chloride: 104 mEq/L (ref 96–112)
Creatinine, Ser: 0.77 mg/dL (ref 0.50–1.10)
GLUCOSE: 101 mg/dL — AB (ref 70–99)
Potassium: 4.2 mEq/L (ref 3.5–5.3)
Sodium: 138 mEq/L (ref 135–145)
Total Protein: 6.3 g/dL (ref 6.0–8.3)

## 2013-12-02 LAB — CBC WITH DIFFERENTIAL/PLATELET
BASO%: 0.8 % (ref 0.0–2.0)
BASOS ABS: 0 10*3/uL (ref 0.0–0.1)
EOS%: 8.6 % — AB (ref 0.0–7.0)
Eosinophils Absolute: 0.5 10*3/uL (ref 0.0–0.5)
HCT: 37.6 % (ref 34.8–46.6)
HEMOGLOBIN: 12.6 g/dL (ref 11.6–15.9)
LYMPH%: 27.8 % (ref 14.0–49.7)
MCH: 31.7 pg (ref 25.1–34.0)
MCHC: 33.5 g/dL (ref 31.5–36.0)
MCV: 94.7 fL (ref 79.5–101.0)
MONO#: 0.3 10*3/uL (ref 0.1–0.9)
MONO%: 5.8 % (ref 0.0–14.0)
NEUT%: 57 % (ref 38.4–76.8)
NEUTROS ABS: 3 10*3/uL (ref 1.5–6.5)
PLATELETS: 244 10*3/uL (ref 145–400)
RBC: 3.97 10*6/uL (ref 3.70–5.45)
RDW: 12.9 % (ref 11.2–14.5)
WBC: 5.2 10*3/uL (ref 3.9–10.3)
lymph#: 1.5 10*3/uL (ref 0.9–3.3)

## 2013-12-02 MED ORDER — TAMOXIFEN CITRATE 20 MG PO TABS: 20.0000 mg | ORAL_TABLET | Freq: Every day | ORAL | Status: DC

## 2013-12-02 NOTE — Progress Notes (Signed)
[place]  Telephone:([phone]) [phone] Fax:(336) [phone]     ID: [name] DOB: July 02, 1945  MR#: [id number]  QJF#:[id number]  Patient Care Team: Catherine Shelter, PA-C as PCP - General (Physician Assistant) PCP: Catherine Shelter, PA-C GYN: [name] SU: [name] OTHER MD: [name]  CHIEF COMPLAINT: Microinvasive breast cancer, ductal carcinoma in situ  CURRENT TREATMENT: Tamoxifen   BREAST CANCER HISTORY: From doctor [name] original intake note, 10/02/2011:   "[name] is a 68 y.o. female. With medical history significant for IBS arthritis ocular disease and gastroesophageal reflux disease. Patient recently presented for an annual screening mammogram on 09/10/2011. On this mammogram patient was found to have a new small cluster of pleomorphic microcalcifications located within the upper outer quadrant of the left breast measuring 3 mm. Because of this biopsy was recommended. Patient went on to have a core needle biopsy performed. The pathology showed invasive ductal carcinoma with mucinous features atypical ductal hyperplasia with calcifications and fibrocystic changes with calcifications. Prognostic markers could not be obtained to 2 small sample size. Patient went on to have MRI of the breasts performed on 09/30/2011. The MRI showed foci of nonspecific enhancement bilaterally there was about biopsy clip and a 1.4 cm postbiopsy hematoma in the upper outer quadrant of the left breast posteriorly no significant residual enhancement was seen in the upper outer quadrant of the left breast no suspicious mass or enhancement in either breast no axillary or internal mammary adenopathy.Patient has been on hormone replacement therapy and it was recommended today that she continue this as well.  Patient is now seen in the multidisciplinary breast clinic for discussion of treatment options. Her case was presented at the multidisciplinary breast conference all of her  pathology and images were reviewed at the conference and add pre-clinic conference."  Her subsequent history is detailed below  INTERVAL HISTORY: [name] returns today for followup of her microinvasive breast cancer. She is establishing herself in my service today.  REVIEW OF SYSTEMS: She is tolerating the tamoxifen well generally. She has some problems with vaginal dryness. She is trying coconut oil and vitamin E for this without much success. Hot flashes also bother her. She is in gabapentin and venlafaxine for this with some success. She gets night cramps. She gets dry eyes. She is constipated. She has aches and pains here in there, which is not more persistent or intense than prior. She does have complex regional pain syndrome and is followed for this by [name]. She has been feeling a "lump" for the past 3 days in her right breast she wanted me to look at today. Otherwise a detailed review of systems today was noncontributory  PAST MEDICAL HISTORY: Past Medical History  Diagnosis Date  . IBS (irritable bowel syndrome)   . Arthritis   . Breast cancer 10/18/2011    dcis, left breast, ER/PR +  . GERD (gastroesophageal reflux disease)   . H/O colonoscopy   . H/O bone density study   . Night sweats   . Sinus complaint   . Heartburn   . Psoriasis   . Anxiety   . Depression   . Hot flashes   . Dysrhythmia     palpitations  . Hypertension   . History of radiation therapy 12/24/11 -01/21/12    left breast  . Hypertension   . Hyperlipidemia   . Abnormal C-reactive protein   . Breast cancer, stage 1     left    PAST SURGICAL HISTORY: Past  Surgical History  Procedure Laterality Date  . Cataract extraction    . Tonsillectomy and adenoidectomy    . Dilation and curettage of uterus    . Abdominal hysterectomy    . Hand surgery      RSD  . Colonoscopy    . Upper gastrointestinal endoscopy    . Breast surgery  10/18/2011    LT br lumpectomy  . Nm myoview ltd  10/19/2008    normal    . Eye surgery      FAMILY HISTORY Family History  Problem Relation Age of Onset  . Cancer Paternal Grandmother     "questionable breast cancer"  . Cancer Cousin     maternal; colon ca  . Heart attack Father   . Hyperlipidemia Mother   . Heart attack Brother   . Hypertension Brother   . Hyperlipidemia Brother    the patient's father died at the age of 29 from myocardial infarction. The patient's mother died at age 70 from heart disease. The patient has 3 brothers, 2 sisters. There is no history of breast or ovarian cancer in the family.  GYNECOLOGIC HISTORY:  No LMP recorded. Patient has had a hysterectomy. Menarche age 68, first live birth age 42. The patient is GX P2. She underwent hysterectomy with unilateral salpingo-oophorectomy at age 56. She took hormone replacement "for many years".  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 Her husband "[name]" restores old cars. Daughter [name] works as a Dealer and [name] works as a Educational psychologist in [place]. The patient has 5 grandchildren. She attends a Warehouse manager., [employer].    ADVANCED DIRECTIVES: Not in place  HEALTH MAINTENANCE: History  Substance Use Topics  . Smoking status: Former Smoker -- 1.00 packs/day    Types: Cigarettes    Quit date: 10/14/1981  . Smokeless tobacco: Never Used  . Alcohol Use: 4.2 oz/week    7 Glasses of wine per week     Colonoscopy:  PAP:  Bone density:  Lipid panel:  Allergies  Allergen Reactions  . Statins   . Tape Hives    Current Outpatient Prescriptions  Medication Sig Dispense Refill  . ALPRAZolam (XANAX) 0.25 MG tablet Take 0.25 mg by mouth at bedtime as needed for sleep.      Marland Kitchen BIOTIN PO Take 550 mg by mouth.      . diclofenac sodium (VOLTAREN) 1 % GEL Apply 1 application topically.      . gabapentin (NEURONTIN) 300 MG capsule Take 1 capsule (300 mg total) by mouth at bedtime.  30 capsule  3  . ibuprofen (ADVIL,MOTRIN) 200  MG tablet Take 200 mg by mouth every 6 (six) hours as needed.      Marland Kitchen lisinopril-hydrochlorothiazide (ZESTORETIC) 10-12.5 MG per tablet Take 1 tablet by mouth daily.  90 tablet  2  . metroNIDAZOLE (METROGEL) 0.75 % gel Apply 1 application topically daily.      . Multiple Vitamin (MULTIVITAMIN) tablet Take 1 tablet by mouth daily.      . nortriptyline (PAMELOR) 25 MG capsule       . Omega-3 Fatty Acids (FISH OIL PO) Take 1 each by mouth.      Marland Kitchen omeprazole (PRILOSEC) 20 MG capsule Take 20 mg by mouth daily.      . predniSONE (STERAPRED UNI-PAK) 10 MG tablet       . RESTASIS 0.05 % ophthalmic emulsion Apply 2 drops to eye daily.      Marland Kitchen  tamoxifen (NOLVADEX) 20 MG tablet Take 1 tablet (20 mg total) by mouth daily.  90 tablet  12  . tretinoin (RETIN-A) 0.05 % cream Apply topically at bedtime.      Marland Kitchen venlafaxine XR (EFFEXOR-XR) 75 MG 24 hr capsule Take 1 capsule (75 mg total) by mouth daily.  30 capsule  3   No current facility-administered medications for this visit.    OBJECTIVE: Middle-aged white woman who appears stated age 68 Vitals:   12/02/13 0919  BP: 134/67  Pulse: 73  Temp: 98 F (36.7 C)  Resp: 18     Body mass index is 24.67 kg/(m^2).    ECOG FS:1 - Symptomatic but completely ambulatory  Ocular: Sclerae unicteric, pupils equal, round and reactive to light Ear-nose-throat: Oropharynx clear, teeth in good repair Lymphatic: No cervical or supraclavicular adenopathy Lungs no rales or rhonchi, good excursion bilaterally Heart regular rate and rhythm, no murmur appreciated Abd soft, nontender, positive bowel sounds MSK no focal spinal tenderness, no upper extremity edema Neuro: non-focal, well-oriented, appropriate affect Breasts: The right breast is unremarkable.The area she had been feeling in the medial aspect of the right breast does not show a discrete mass by palpation or inspection. It appears entirely normal. The left breast is status post lumpectomy and radiation. There is  no evidence of local recurrence. The left axilla is benign.  LAB RESULTS:  CMP     Component Value Date/Time   NA 140 06/11/2013 0926   NA 137 10/17/2011 1502   K 3.1* 06/11/2013 0926   K 3.2* 10/17/2011 1502   CL 103 06/08/2012 1407   CL 98 10/17/2011 1502   CO2 28 06/11/2013 0926   CO2 29 10/17/2011 1502   GLUCOSE 119 06/11/2013 0926   GLUCOSE 99 06/08/2012 1407   GLUCOSE 102* 10/17/2011 1502   BUN 19.0 06/11/2013 0926   BUN 11 10/17/2011 1502   CREATININE 0.8 06/11/2013 0926   CREATININE 0.73 10/17/2011 1502   CALCIUM 9.6 06/11/2013 0926   CALCIUM 9.2 10/17/2011 1502   PROT 6.7 06/11/2013 0926   PROT 7.2 10/02/2011 1603   ALBUMIN 3.8 06/11/2013 0926   ALBUMIN 3.8 10/02/2011 1603   AST 13 06/11/2013 0926   AST 15 10/02/2011 1603   ALT 13 06/11/2013 0926   ALT 10 10/02/2011 1603   ALKPHOS 54 06/11/2013 0926   ALKPHOS 73 10/02/2011 1603   BILITOT 0.29 06/11/2013 0926   BILITOT 0.2* 10/02/2011 1603   GFRNONAA 88* 10/17/2011 1502   GFRAA >90 10/17/2011 1502    I No results found for this basename: SPEP, UPEP,  kappa and lambda light chains    Lab Results  Component Value Date   WBC 5.2 12/02/2013   NEUTROABS 3.0 12/02/2013   HGB 12.6 12/02/2013   HCT 37.6 12/02/2013   MCV 94.7 12/02/2013   PLT 244 12/02/2013      Chemistry      Component Value Date/Time   NA 140 06/11/2013 0926   NA 137 10/17/2011 1502   K 3.1* 06/11/2013 0926   K 3.2* 10/17/2011 1502   CL 103 06/08/2012 1407   CL 98 10/17/2011 1502   CO2 28 06/11/2013 0926   CO2 29 10/17/2011 1502   BUN 19.0 06/11/2013 0926   BUN 11 10/17/2011 1502   CREATININE 0.8 06/11/2013 0926   CREATININE 0.73 10/17/2011 1502      Component Value Date/Time   CALCIUM 9.6 06/11/2013 0926   CALCIUM 9.2 10/17/2011 1502   ALKPHOS 54 06/11/2013  0926   ALKPHOS 73 10/02/2011 1603   AST 13 06/11/2013 0926   AST 15 10/02/2011 1603   ALT 13 06/11/2013 0926   ALT 10 10/02/2011 1603   BILITOT 0.29 06/11/2013 0926   BILITOT 0.2* 10/02/2011 1603       Lab Results  Component Value  Date   LABCA2 8 10/02/2011    No components found with this basename: [id number]    No results found for this basename: INR,  in the last 168 hours  Urinalysis No results found for this basename: colorurine, appearanceur, labspec, phurine, glucoseu, hgbur, bilirubinur, ketonesur, proteinur, urobilinogen, nitrite, leukocytesur    STUDIES: Clinical Data: 68 year old postmenopausal female with history of  left breast cancer. The patient takes calcium, vitamin D, and  Tamoxifen. She has taken hormones, Fosamax, Actonel, and Boniva in  the past.  DUAL X-RAY ABSORPTIOMETRY (DXA) FOR BONE MINERAL DENSITY  AP LUMBAR SPINE (L1 - L4)  Bone Mineral Density (BMD): 0.988 g/cm2  Young Adult T Score: -0.5  Z Score: 1.3  LEFT FEMUR (NECK)  Bone Mineral Density (BMD): 0.622 g/cm2  Young Adult T Score: -2.0  Z Score: -0.5  ASSESSMENT: Patient's diagnostic category is LOW BONE MASS by WHO  Criteria.  FRACTURE RISK: MODERATE  FRAX: Based on the [place] model, the 10  year probability of a major osteoporotic fracture is 17%. The 10  year probability of a hip fracture is 2.8%.  CLINICAL DATA: Two masses felt by the patient in the upper right  breast. These are less prominent today. Status post left lumpectomy  and radiation therapy for breast cancer in 2013.  EXAM:  DIGITAL DIAGNOSTIC BILATERAL MAMMOGRAM WITH TOMOSYNTHESIS AND CAD  ULTRASOUND RIGHT BREAST  COMPARISON: Previous examinations.  ACR Breast Density Category c: The breast tissue is heterogeneously  dense, which may obscure small masses.  FINDINGS:  Stable post lumpectomy and postradiation changes on the left. No  findings suspicious for malignancy in either breast.  Mammographic images were processed with CAD.  On physical exam, there is mild palpable soft tissue thickening in  the areas of patient concern in the upper right breast.  Ultrasound is performed, showing normal appearing breast tissue in  the  area of patient concern in the upper right breast and normal  appearing fat lobules in the more superiorly located area of  concern.  IMPRESSION:  No evidence of malignancy.  RECOMMENDATION:  Bilateral diagnostic mammogram in 1 year.  I have discussed the findings and recommendations with the patient.  Results were also provided in writing at the conclusion of the  visit. If applicable, a reminder letter will be sent to the patient  regarding the next appointment.  BI-RADS CATEGORY 2: Benign.  Electronically Signed  By: [name] M.D.  On: 08/20/2013 12:57  ASSESSMENT: 68 y.o. [place] woman status post left upper outer quadrant biopsy 09/24/2011 for a microinvasive ductal carcinoma with mucinous features, grade 1, HER-2 negative, with insufficient carcinoma present for estrogen or progesterone receptor analysis ([id number])  (1) status post left lumpectomy and sentinel lymph node sampling 10/18/2011 showing no residual invasive carcinoma; there was a low-grade ductal carcinoma in situ measuring 1.2 cm, estrogen and progesterone receptor positive, with negative margins.Ashok Croon [id number])  (2) completed radiation therapy to the left breast 01/21/2012  (3) started tamoxifen December 2013.  (4) osteopenia with a T score of -2.0 on DEXA scan 01/20/2013  PLAN: I spent approximately 50 minutes reviewing Katherina situation. She understands her cancer was microinvasive.  More specifically the invasive component measured less than 1 mm. The cure rate for these tumors with local treatment only (surgery plus radiation) is exceedingly high. The benefit of taking tamoxifen in this setting is minimal, perhaps a 2% reduction in the risk of relapse. Of course we do not actually know that this invasive tumor was estrogen receptor positive, since we could not tested for the estrogen and progesterone receptors.  However she did have ductal carcinoma in situ. The risk of that recurring after local treatment may  be as high as 8-10%. Tamoxifen would cut that risk in half as well.  Finally, and most significantly, because she has had a history of breast cancer, she will have a risk of developing another breast cancer approximately 1% per year. Tamoxifen cuts that risk in half. Since she can expect to live another 20-25 years, that is a more significant risk reduction.  We discussed the difficulty in sorting out the difference between menopausal complications such as weight gain, hot flashes, insomnia, mood changes, vaginal dryness and thinning bones, from the side effects of tamoxifen. Tamoxifen can certainly worsen the hot flashes. It tends to cause a vaginal wetness rather than a vaginal dryness problem. It doesn't affect the other menopausal symptoms. Of course it can cause blood clots and promote endometrial cancer. Angelisa is aware that these are rare complications.  The biggest issue she is having is the hot flashes. She is taking gabapentin at bedtime which is helping considerably. She also takes venlafaxine and that helps the "daytime close quotes hot flashes. She is having vaginal dryness and I am referring her to our intimacy and pelvic health program.   I will see Aisling on a yearly basis until she completes her 5 years of tamoxifen, at which time she may choose to "graduate" from breast cancer followup.  The patient has a good understanding of the overall plan. She agrees with it. She knows the goal of treatment in her case is cure. She will call with any problems that may develop before her next visit here.  Catherine Cruel, MD   12/02/2013 5:30 PM

## 2013-12-24 ENCOUNTER — Other Ambulatory Visit: Payer: Medicare Other

## 2013-12-24 ENCOUNTER — Ambulatory Visit: Payer: Medicare Other | Admitting: Oncology

## 2013-12-28 NOTE — Telephone Encounter (Signed)
NONE 

## 2013-12-30 ENCOUNTER — Encounter: Payer: Self-pay | Admitting: *Deleted

## 2014-01-03 ENCOUNTER — Ambulatory Visit: Payer: Medicare Other | Admitting: Oncology

## 2014-01-03 ENCOUNTER — Other Ambulatory Visit: Payer: Medicare Other

## 2014-06-02 ENCOUNTER — Other Ambulatory Visit (INDEPENDENT_AMBULATORY_CARE_PROVIDER_SITE_OTHER): Payer: Self-pay | Admitting: General Surgery

## 2014-06-02 DIAGNOSIS — N6310 Unspecified lump in the right breast, unspecified quadrant: Secondary | ICD-10-CM

## 2014-06-03 ENCOUNTER — Telehealth: Payer: Self-pay | Admitting: *Deleted

## 2014-06-03 NOTE — Telephone Encounter (Signed)
Left message for a return phone call to schedule appointment to come and discuss her issues with Tamoxifen.  Awaiting patient response.

## 2014-06-06 ENCOUNTER — Telehealth: Payer: Self-pay | Admitting: Adult Health

## 2014-06-06 NOTE — Telephone Encounter (Signed)
I spoke with Ms. Bergevin about scheduling a Survivorship Clinic visit with me to address some survivorship concerns she is having, at the request of Iris Pert, Breast Nurse Navigator.  She is having some problems tolerating the Tamoxifen.    She is now scheduled to come see me on 06/21/14 and we will address her concerns at that time.  I look forward to participating in her care.   Mike Craze, NP [place] [phone]

## 2014-06-20 ENCOUNTER — Telehealth: Payer: Self-pay | Admitting: Adult Health

## 2014-06-20 NOTE — Telephone Encounter (Signed)
[name] called me to attempt to reschedule her Survivorship Clinic appointment, originally scheduled for tomorrow, 06/21/14.  She now has a conflicting appointment and would like to reschedule her survivorship visit.   I have rescheduled her visit to see on to Wednesday, 06/29/14 at 2pm.  I gave her my direct office number and encouraged her to call with me with any questions or concerns that come up before her appointment here.  I look forward to participating in her care!   Mike Craze, NP [place] [phone]

## 2014-06-21 ENCOUNTER — Ambulatory Visit: Payer: Self-pay | Admitting: Adult Health

## 2014-06-29 ENCOUNTER — Ambulatory Visit (HOSPITAL_BASED_OUTPATIENT_CLINIC_OR_DEPARTMENT_OTHER): Payer: Medicare Other | Admitting: Adult Health

## 2014-06-29 VITALS — BP 136/71 | HR 94 | Temp 97.7°F | Resp 16

## 2014-06-29 DIAGNOSIS — R232 Flushing: Secondary | ICD-10-CM

## 2014-06-29 DIAGNOSIS — T451X5A Adverse effect of antineoplastic and immunosuppressive drugs, initial encounter: Secondary | ICD-10-CM

## 2014-06-29 DIAGNOSIS — C50412 Malignant neoplasm of upper-outer quadrant of left female breast: Secondary | ICD-10-CM | POA: Diagnosis not present

## 2014-06-29 DIAGNOSIS — N8189 Other female genital prolapse: Secondary | ICD-10-CM | POA: Diagnosis not present

## 2014-06-30 ENCOUNTER — Other Ambulatory Visit: Payer: Self-pay | Admitting: Oncology

## 2014-06-30 ENCOUNTER — Encounter: Payer: Self-pay | Admitting: Adult Health

## 2014-06-30 NOTE — Progress Notes (Signed)
CLINIC:  Cancer Survivorship   REASON FOR VISIT:  Routine follow-up post-treatment for a recent history of breast cancer.  BRIEF ONCOLOGIC HISTORY:    Breast cancer of upper-outer quadrant of left female breast   09/12/2011 Mammogram Recall from screening mammo for diagnostic mammo revealing tiny (3 mm) cluster of calcs located in UOQ of left breast   09/24/2011 Initial Diagnosis Breast cancer of upper-outer quadrant of left female breast   09/24/2011 Initial Biopsy Left breast needle core biopsy showing grade 1, IDC with mucinous features and atypical hyperplasia with associated calcs. Not enough tumor for breast prognostic profile with ER/PR/HER2.    09/30/2011 Breast MRI Known malignancy, left breast without significant residual enhancement seen on MRI. No MRI specific evidence of malignancy in right breast.   10/18/2011 Definitive Surgery Left lumpectomy with left ax SLNB revealed low-grade DCIS with associated mucin spanning 1.2 cm. ER+ (99%), PR+ (100%).  Surgical margins negative.  0/2 lymph nodes positive for metastatic tumor.    12/24/2011 - 01/21/2012 Radiation Therapy Left breast: total dose 42.72 Gy over 16 fractions.  Left breast boost: total dose 10 Gy over 5 fractions.     Anti-estrogen oral therapy Tamoxifen started: 02/2012.  Planned duration of treatment is 5 years.    06/29/2014 Survivorship Survivorship Care Plan received and reviewed with patient in-person in [facility].     INTERVAL HISTORY:  Ms. [name] presents to the [facility] today for our initial meeting to review her survivorship care plan detailing her treatment course for breast cancer, as well as monitoring long-term side effects of that treatment, education regarding health maintenance, screening, and overall wellness and health promotion.     Ms. [name] was diagnosed with Stage IA, microinvasive ductal carcinoma of the left breast in 09/2011.  Prior to her diagnosis of cancer, Ms. [name] was on  hormone-replacement therapy with estradiol which was discontinued at the time of diagnosis.  She reported having hot flashes during the course of her active treatment (after stopping the HRT).   In 02/2012, the patient began adjuvant anti-estrogen therapy with Tamoxifen.  Since that time, the patient has been experiencing hot flashes and vaginal dryness with varying degrees of both severity and quantity.  On a "bad day", Ms. [name] reports having over 20 hot flashes.  It is difficult for her to quantify her "typical" hot flash pattern, however they are very bothersome for her.  With regards to vaginal dryness, again this has been an issue since stopping HRT, however she feels it has gotten worse since starting the Tamoxifen about 2.5 years ago.  She has seen a Uro-Gynecology specialist for her "weak bladder" and periodic urinary incontinence.  She also struggles with chronic constipation.  She presents to the Survivorship Clinic today with the hopes of hearing additional anti-estrogen options she can consider.    REVIEW OF SYSTEMS:  General: Denies fever, chills, unintentional weight loss, or generalized fatigue.  Cardiac: Denies palpitations, chest pain,. Endorses chronic mild ankle edema.   Respiratory: Denies cough, shortness of breath, and dyspnea on exertion.  GI: Denies abdominal pain, diarrhea, nausea, or vomiting.  Endorses chronic constipation, for which she also sees a GI specialist.  GU: Denies dysuria, hematuria, vaginal bleeding, or vaginal discharge.  She endorses pain with intercourse and vaginal dryness.  Musculoskeletal: Endorses some myalgia, which she largely attributes to her chronic pain syndrome.  Neuro: Denies headache or recent falls. Denies peripheral neuropathy. Skin: Denies rash, pruritis, or open wounds.  Breast: Denies any new  nodularity, masses, tenderness, nipple changes, or nipple discharge.  She does have a right breast cyst, which Dr. Marlou Starks has scheduled to have removed  for the patient soon. Psych: Denies depression, insomnia, or memory loss. Endorses occasional anxiety when she has a "reminder of the cancer."   A 14-point review of systems was completed and was negative, except as noted above.  BRIEF ONCOLOGIC HISTORY:    Breast cancer of upper-outer quadrant of left female breast   09/12/2011 Mammogram Recall from screening mammo for diagnostic mammo revealing tiny (3 mm) cluster of calcs located in UOQ of left breast   09/24/2011 Initial Diagnosis Breast cancer of upper-outer quadrant of left female breast   09/24/2011 Initial Biopsy Left breast needle core biopsy showing grade 1, IDC with mucinous features and atypical hyperplasia with associated calcs. Not enough tumor for breast prognostic profile with ER/PR/HER2.    09/30/2011 Breast MRI Known malignancy, left breast without significant residual enhancement seen on MRI. No MRI specific evidence of malignancy in right breast.   10/18/2011 Definitive Surgery Left lumpectomy with left ax SLNB revealed low-grade DCIS with associated mucin spanning 1.2 cm. ER+ (99%), PR+ (100%).  Surgical margins negative.  0/2 lymph nodes positive for metastatic tumor.    12/24/2011 - 01/21/2012 Radiation Therapy Left breast: total dose 42.72 Gy over 16 fractions.  Left breast boost: total dose 10 Gy over 5 fractions.     Anti-estrogen oral therapy Tamoxifen started: 02/2012.  Planned duration of treatment is 5 years.    06/29/2014 Survivorship Survivorship Care Plan received and reviewed with patient in-person in [facility].      ONCOLOGY TREATMENT TEAM:  1. Surgeon:  Dr. Marlou Starks at [facility] 2. Medical Oncologist: Dr. Jana Hakim  3. Radiation Oncologist: Dr. Pablo Ledger    PAST MEDICAL/SURGICAL HISTORY:  Past Medical History  Diagnosis Date  . IBS (irritable bowel syndrome)   . Arthritis   . Breast cancer 10/18/2011    dcis, left breast, ER/PR +  . GERD (gastroesophageal reflux disease)   . H/O colonoscopy    . H/O bone density study   . Night sweats   . Sinus complaint   . Heartburn   . Psoriasis   . Anxiety   . Depression   . Hot flashes   . Dysrhythmia     palpitations  . Hypertension   . History of radiation therapy 12/24/11 -01/21/12    left breast  . Hypertension   . Hyperlipidemia   . Abnormal C-reactive protein   . Breast cancer, stage 1     left   Past Surgical History  Procedure Laterality Date  . Cataract extraction    . Tonsillectomy and adenoidectomy    . Dilation and curettage of uterus    . Abdominal hysterectomy    . Hand surgery      RSD  . Colonoscopy    . Upper gastrointestinal endoscopy    . Breast surgery  10/18/2011    LT br lumpectomy  . Nm myoview ltd  10/19/2008    normal  . Eye surgery       ALLERGIES:  Allergies  Allergen Reactions  . Statins   . Tape Hives     CURRENT MEDICATIONS:  Outpatient Encounter Prescriptions as of 06/29/2014  Medication Sig Note  . ALPRAZolam (XANAX) 0.25 MG tablet Take 0.25 mg by mouth at bedtime as needed for sleep.   Marland Kitchen BIOTIN PO Take 550 mg by mouth.   Marland Kitchen CALCIUM PO Take by mouth.   Marland Kitchen  diclofenac sodium (VOLTAREN) 1 % GEL Apply 1 application topically.   . gabapentin (NEURONTIN) 300 MG capsule Take 1 capsule (300 mg total) by mouth at bedtime.   . ibuprofen (ADVIL,MOTRIN) 200 MG tablet Take 200 mg by mouth every 6 (six) hours as needed.   . lisinopril-hydrochlorothiazide (ZESTORETIC) 10-12.5 MG per tablet Take 1 tablet by mouth daily.   . MAGNESIUM PO Take by mouth.   . metroNIDAZOLE (METROGEL) 0.75 % gel Apply 1 application topically daily.   . Multiple Vitamin (MULTIVITAMIN) tablet Take 1 tablet by mouth daily.   . nortriptyline (PAMELOR) 25 MG capsule Take 50 mg by mouth.  06/11/2013: Received from: External Pharmacy  . Omega-3 Fatty Acids (FISH OIL PO) Take 1 each by mouth.   . omeprazole (PRILOSEC) 20 MG capsule Take 20 mg by mouth daily.   . predniSONE (STERAPRED UNI-PAK) 10 MG tablet  06/11/2013: Received  from: External Pharmacy  . RESTASIS 0.05 % ophthalmic emulsion Apply 2 drops to eye daily.   . tamoxifen (NOLVADEX) 20 MG tablet Take 1 tablet (20 mg total) by mouth daily.   . tretinoin (RETIN-A) 0.05 % cream Apply topically at bedtime.   . venlafaxine XR (EFFEXOR-XR) 75 MG 24 hr capsule Take 1 capsule (75 mg total) by mouth daily.   . VITAMIN E PO Take by mouth.      ONCOLOGIC FAMILY HISTORY:  1. Paternal grandmother: Breast cancer (patient unsure) 2. Maternal cousin: Colon cancer    SOCIAL HISTORY:  [name] is married and lives with her husband in [place], [place].  Ms. 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   She drinks wine occasionally and has a history of smoking cigarettes, but quit in the 1980s.  She denies any current or history of illicit drug use.     PHYSICAL EXAMINATION:  Vital Signs:   Filed Vitals:   06/29/14 1500  BP: 136/71  Pulse: 94  Temp: 97.7 F (36.5 C)  Resp: 16   General: Well-nourished, well-appearing female in no acute distress.  She is unaccompanied today.  HEENT: Head is atraumatic and normocephalic.  Pupils equal and reactive to light and accomodation. Conjunctivae clear without exudate.  Sclerae anicteric. Oral mucosa is pink, moist, and intact without lesions.  Oropharynx is pink without lesions or erythema.  Lymph: No cervical, supraclavicular, infraclavicular, or axillary lymphadenopathy noted on palpation.  Cardiovascular: Regular rate and rhythm without murmurs, rubs, or gallops. Respiratory: Clear to auscultation bilaterally. Chest expansion symmetric without accessory muscle use on inspiration or expiration.  GI: Abdomen soft and round. No tenderness to palpation. Bowel sounds normoactive in 4 quadrants. No hepatosplenomegaly.   GU: Deferred.  Musculoskeletal: Muscle strength 5/5 in all extremities.  Full ROM noted in all extremities.  Neuro: No focal deficits. Steady  gait.  Psych: Mood and affect normal and appropriate for situation.  Extremities: No cyanosis or clubbing.  Trace bilateral ankle edema noted. Skin: Warm and dry. No open lesions noted.   LABORATORY DATA:  None for this visit.  DIAGNOSTIC IMAGING:  None for this visit.     ASSESSMENT AND PLAN:   1. History of breast cancer:  Ms. [name] will follow-up with her medical oncologist, Dr. Magrinat in September 2016 with history and physical exam per surveillance protocol.  A comprehensive survivorship care plan and treatment summary was reviewed with the patient today detailing her breast cancer diagnosis, treatment course, potential late/long-term effects of treatment, appropriate follow-up care with   recommendations for the future, and patient education resources.  A copy of this summary, along with a letter will be sent to the patient's primary care provider, mail/fax/In Basket message after today's visit.  Ms. [name] is welcome to return to the Survivorship Clinic in the future, as needed.   2. Anti-estrogen therapy discussion/Hot flashes: Ms. [name] continues to struggle with hot flashes despite Effexor and gabapentin.  We discussed at length the side effects of Tamoxifen versus aromatase inhibitors.  We also reviewed the timeframe of her complaints of hot flashes, which began after we had to stop the HRT at diagnosis and long before she ever began therapy with Tamoxifen.  However, the hot flashes are very distressing for her and we discussed several options today detailed below.   -Option #1: She continues to take the Tamoxifen as currently prescribed.  She had microinvasive disease on biopsy and only DCIS present on the pathology at the time of surgery.  I reiterated her risk of recurrence with DCIS and how Tamoxifen offers that risk reduction over the remainder of her lifetime (since she is overall in good health and we expect her to have a long life expectancy).  -Option #2: Take a "drug  holiday" from the Tamoxifen for a few months to see if the hot flashes improve.  We discussed tat this would not affect her long-term risk of recurrence to take a little break from the Tamoxifen to see if her hot flashes improve.  I did educate her about the possibility of the hot flashes not improving off Tamoxifen, since they were present prior to initiation of the drug.  -Option #3: Stop the Tamoxifen all together.  We explored what she would be "willing to live with" in terms of her risk of recurrence and the peace of mind that anti-estrogen therapy brings for some women to take a pill every day to reduce that risk versus understanding the risks, but choosing a better quality of life without Tamoxifen.  Again, I suspect that largely her symptoms are the result of menopause and not the Tamoxifen alone, but we explored these options together. -Option #4: Lastly, we discussed switching from Tamoxifen to an aromatase inhibitor.  Given her previous and current complaints of vaginal dryness, some myalgias (as related to her pain syndrome), and osteopenia evident on her most recent DEXA scan, switching to an aromatase inhibitor is not a great option for her.  We explored the differences between the two types of anti-estrogen therapy and Ms. [name] expressed understanding about why this was not the best choice for her in her current condition.   I encouraged Ms. [name] to consider each of these options very carefully and that I would follow up with her next week via phone to help answer any additional questions and see what she would like to do.  We weighed the pros and cons of each option together and I encouraged her that we will support her whatever decision she chooses.  I discussed Ms. [name]'s concerns with her medical oncologist, Dr. Magrinat, who is in full support of Ms. [name]'s decision, whatever she chooses to do.    3.  Vaginal dryness:  This is likely due to menopause and could potentially be  related to the Tamoxifen (although Tamoxifen generally causes a wet/sticky vaginal discharge instead of vaginal dryness).   However, I gave Ms. [name] education regarding of vaginal moisturizers and lubricants.  She received a copy of the "Sexuality for Women With Cancer" book produced by the   [employer] which offers excellent options for vaginal dryness.  Her Uro-Gynecologist recommended her use some vaginal estrogen to help with her symptoms.  While she is taking Tamoxifen, it is safe for her to use certain vaginal estrogen products under the supervision of her physician.  I also have placed a referral to our Pelvic Floor Rehab Therapist to assist Ms. [name] in strengthening her weak pelvic floor muscles.    4. Bone health:  Given Ms. [name]'s age and history of breast cancer, she is at risk for bone demineralization.  Her most recent DEXA scan was completed on 01/20/13 and showed osteopenia.  She will be due for her next scan sometime this year or next, as clinically indicated.  In the meantime, she was encouraged to increase her consumption of foods rich in calcium, as well as increase her weight-bearing activities.  She was given education on specific activities to promote bone health.  5. Cancer screening:  Due to Ms. [name]'s history and her age, she should receive screening for skin cancers, colon cancer, and gynecologic cancers.  She understands that any post-menopausal bleeding, particularly while taking Tamoxifen, is very important for her to report to myself, Dr. Magrinat, her PCP, or her Gynecology specialist. The information and recommendations are listed on the patient's comprehensive care plan/treatment summary and were reviewed in detail with the patient.    6. Health maintenance and wellness promotion: Ms. [name] was encouraged to consume 5-7 servings of fruits and vegetables per day. She was also encouraged to engage in moderate to vigorous exercise for 30 minutes per day  most days of the week. She was instructed to limit her alcohol consumption and continue to abstain from tobacco use.    7. Support services/counseling: It is not uncommon for this period of the patient's cancer care trajectory to be one of many emotions and stressors.  Ms. [name] was encouraged to take advantage of our many support services programs, support groups, and/or counseling in coping with her new life as a cancer survivor after completing anti-cancer treatment.  She was offered support today through active listening and expressive supportive counseling.  She was given information regarding our available services and encouraged to contact me with any questions or for help enrolling in any of our support group/programs.    A total of 65 minutes of face-to-face time was spent with this patient with greater than 50% of that time in counseling and care-coordination.   Gretchen Dawson, NP Survivorship Program [facility] [phone]   Note: PRIMARY CARE PROVIDER HEPLER,MARK, PA-C [phone]   

## 2014-07-07 ENCOUNTER — Telehealth: Payer: Self-pay | Admitting: Adult Health

## 2014-07-07 NOTE — Telephone Encounter (Signed)
I called Ms. Grieb to follow-up with her after our visit last week regarding her decision about continuing or stopping her Tamoxifen anti-estrogen therapy.    She tells me that she has given this quite a bit of thought and she wishes to continue taking the Tamoxifen.  For her, the peace of mind of taking the pill and not having to worry about her cancer or her bone health (if we had switched to an AI) is worth the side effects for her.  She is planning to have a breast cyst removed next week by her surgeon and I wished her well for that procedure.  She is also anticipating some surgery in the future to help with her urinary/bowel issues.   She expressed extreme gratitude for my call and for our time together last week during her office visit.  I encouraged her to call me with any other questions or concerns and I would be happy to help!   Mike Craze, NP [place] [phone]

## 2014-07-08 ENCOUNTER — Encounter: Payer: Self-pay | Admitting: Physical Therapy

## 2014-07-08 ENCOUNTER — Ambulatory Visit: Payer: Medicare Other | Attending: Adult Health | Admitting: Physical Therapy

## 2014-07-08 DIAGNOSIS — K219 Gastro-esophageal reflux disease without esophagitis: Secondary | ICD-10-CM | POA: Diagnosis not present

## 2014-07-08 DIAGNOSIS — N8189 Other female genital prolapse: Secondary | ICD-10-CM | POA: Diagnosis present

## 2014-07-08 DIAGNOSIS — I10 Essential (primary) hypertension: Secondary | ICD-10-CM | POA: Diagnosis not present

## 2014-07-08 DIAGNOSIS — Z90710 Acquired absence of both cervix and uterus: Secondary | ICD-10-CM | POA: Insufficient documentation

## 2014-07-08 DIAGNOSIS — N816 Rectocele: Secondary | ICD-10-CM | POA: Diagnosis not present

## 2014-07-08 DIAGNOSIS — Z87898 Personal history of other specified conditions: Secondary | ICD-10-CM | POA: Diagnosis not present

## 2014-07-08 DIAGNOSIS — Z923 Personal history of irradiation: Secondary | ICD-10-CM | POA: Diagnosis not present

## 2014-07-08 DIAGNOSIS — R32 Unspecified urinary incontinence: Secondary | ICD-10-CM | POA: Insufficient documentation

## 2014-07-08 DIAGNOSIS — L409 Psoriasis, unspecified: Secondary | ICD-10-CM | POA: Insufficient documentation

## 2014-07-08 DIAGNOSIS — K589 Irritable bowel syndrome without diarrhea: Secondary | ICD-10-CM | POA: Insufficient documentation

## 2014-07-08 DIAGNOSIS — E785 Hyperlipidemia, unspecified: Secondary | ICD-10-CM | POA: Insufficient documentation

## 2014-07-08 NOTE — Therapy (Signed)
[facility] [address], [place], [place], [address] Phone: [phone]   Fax:  [phone]  Physical Therapy Evaluation  Patient Details  Name: [name] MRN: [id number] Date of Birth: [birth year] Referring Provider:  Holley Bouche, NP  Encounter Date: 07/08/2014      PT End of Session - 07/08/14 0937    Visit Number 1   Date for PT Re-Evaluation 09/02/14   PT Start Time 0930   PT Stop Time 7096   PT Time Calculation (min) 45 min   Activity Tolerance Patient tolerated treatment well   Behavior During Therapy [facility] for tasks assessed/performed      Past Medical History  Diagnosis Date  . IBS (irritable bowel syndrome)   . Arthritis   . Breast cancer 10/18/2011    dcis, left breast, ER/PR +  . GERD (gastroesophageal reflux disease)   . H/O colonoscopy   . H/O bone density study   . Night sweats   . Sinus complaint   . Heartburn   . Psoriasis   . Anxiety   . Depression   . Hot flashes   . Dysrhythmia     palpitations  . Hypertension   . History of radiation therapy 12/24/11 -01/21/12    left breast  . Hypertension   . Hyperlipidemia   . Abnormal C-reactive protein   . Breast cancer, stage 1     left    Past Surgical History  Procedure Laterality Date  . Cataract extraction    . Tonsillectomy and adenoidectomy    . Dilation and curettage of uterus    . Abdominal hysterectomy    . Hand surgery      RSD  . Colonoscopy    . Upper gastrointestinal endoscopy    . Breast surgery  10/18/2011    LT br lumpectomy  . Nm myoview ltd  10/19/2008    normal  . Eye surgery      There were no vitals filed for this visit.  Visit Diagnosis:  Pelvic floor weakness in female - Plan: PT plan of care cert/re-cert      Subjective Assessment - 07/08/14 0945    Subjective Patient reports difficulties with bladder leakage, vaginal dryness, and has to strain to have a bowel movement.  Patient reports her  bladder is dropping. Issues has become worse in the past 6 months. Patient reports a sticky discharge  rectally  3 weeks ago. Right lower quadrant pain with sudden onset  starting 4 months ago. Plans in the furture to have the prolapse corrected surgically but needs to get the constipation under control.    Diagnostic tests MRI of to show how she has a bowel movement   Patient Stated Goals increase strength of pelvic floor muscles to reduce urinary leakage.     Currently in Pain? Yes   Pain Score 7    Pain Location Abdomen  right lower quadrant   Pain Orientation Right   Pain Descriptors / Indicators Radiating   Pain Type Chronic pain   Pain Radiating Towards to back   Pain Onset --  4 months ago sudden onset   Pain Frequency Intermittent   Aggravating Factors  Not sure   Pain Relieving Factors heat   Multiple Pain Sites No            [facility] PT Assessment - 07/08/14 0001    Assessment   Medical Diagnosis Pelvic floor weakness   Onset Date 01/17/15  Prior Therapy None   Precautions   Precaution Comments No ultrasound   Balance Screen   Has the patient fallen in the past 6 months No   Has the patient had a decrease in activity level because of a fear of falling?  No   Is the patient reluctant to leave their home because of a fear of falling?  No   Prior Function   Level of Independence Independent with basic ADLs   AROM   Lumbar Flexion decreased by 50%   Lumbar Extension decreased by 25%   Lumbar - Right Side Bend decreased by 25%   Lumbar - Left Side Bend decreased 25%   Strength   Overall Strength Comments right hip abd 3+/5, extension 4/5, left hip abd 4+/5   Palpation   Palpation palpable tenderness located in right lower quadrant, right posas  rt. ilium anteriorly rotated   Ambulation/Gait   Stairs --  step to step and cautious   Balance   Balance Assessed --  SLS very shaky and 5 sec. bil.                  Pelvic Floor Special Questions - 07/08/14  0001    Diagnostic Test/Procedure Performed No   Prior Pregnancies Yes   Number of Pregnancies 2   Number of Vaginal Deliveries 2   Episiotomy Performed Yes   Currently Sexually Active Yes   Marinoff Scale discomfort that does not affect completion  dryness   Urinary Leakage Yes   Pad use 1  if going out. none at home   Activities that cause leaking With strong urge;Coughing;Laughing;Exercising   Urinary urgency Yes   Urinary frequency every 2 hours   Falling out feeling (prolapse) Yes   Activities that cause feeling of prolapse standing too long  feels pressure   External Perineal Exam dryness externally   Skin Integrity Intact   Prolapse Posterior Wall   Pelvic Floor Internal Exam Patient confirms identification and approves physical therapist to assess muscle strength and integrity   Exam Type Vaginal   Strength good squeeze, good lift, able to hold agaisnt strong resistance   Strength # of reps 4   Strength # of seconds 3                  PT Education - 07/08/14 1152    Education provided No          PT Short Term Goals - 07/08/14 1206    PT SHORT TERM GOAL #1   Title understand what lubricators and moisturizers to use for vaginal dryness   Time 4   Period Weeks   Status New   PT SHORT TERM GOAL #2   Title single leg stance for 6 seconds with minimal body sway due to increased strength   Time 4   Period Weeks   Status New   PT SHORT TERM GOAL #3   Title urinary leakage decreased >/= 25%   Time 4   Period Weeks   Status New   PT SHORT TERM GOAL #4   Title understand how to have a bowel movement without straining to reduce the rectocele   Time 4   Period Weeks   Status New           PT Long Term Goals - 07/08/14 1207    PT LONG TERM GOAL #1   Title single leg stance for 10 seconds with minimal body sway due to increased strength and balance  Time 8   Period Weeks   Status New   PT LONG TERM GOAL #2   Title pelvic floor contraction  4+/5 holding for 10 seconds so urinary leakage has decreased    Time 8   Period Weeks   Status New   PT LONG TERM GOAL #3   Title pain with intercourse decreased >/= 75% due to increased moisture in the vaginal area   Time 8   Period Weeks   Status New   PT LONG TERM GOAL #4   Title improvement of constipation >/= 60%   Time 8   Period Weeks   Status New               Plan - 2014/07/27 1156    Clinical Impression Statement Patient is a 69 year old female with pelvic floor weakness, rectocele, urinary leakage. Pateint pelvic floor strength is 4/5. Patient has weak hip muscles pelvic floor muscles,  and abdominal musculature. Patient has moderate trunk sway with single leg stance  making it difficult for patient  to go up and down steps.  Patient has difficulty with constipation and strains during a bowel movement increasing the rectocele.  Patient has a right anteriorly rotated ilium  contributing  to pain in the right lower abdominal pain.  Patient has palpable tenderness located in right psoas and lower abdominal muscles. Patient  has vaginal dryness and pain with intercourse     Pt will benefit from skilled therapeutic intervention in order to improve on the following deficits Pain;Decreased strength;Decreased mobility;Increased muscle spasms;Decreased balance;Decreased activity tolerance;Decreased endurance   Rehab Potential Good   Clinical Impairments Affecting Rehab Potential none   PT Frequency 1x / week   PT Duration 8 weeks   PT Treatment/Interventions Therapeutic activities;Moist Heat;Biofeedback;Balance training;Manual techniques;Neuromuscular re-education;Stair training;Cryotherapy;Electrical Stimulation;Functional mobility training   PT Next Visit Plan correct pelvis, soft tissue work to right lower abdominal, education on lubrication and moisturizers, hip strength, balance exercises and pelvic floor contraction.    PT Home Exercise Plan hip exercises and pelvic floor  contraction   Recommended Other Services None   Consulted and Agree with Plan of Care Patient          G-Codes - 2014-07-27 1610    Functional Assessment Tool Used Therapist discretion due to difficulty with consitpation and urinary leakage 60% limitation   Functional Limitation Other PT primary   Other PT Primary Current Status (R6045) At least 60 percent but less than 80 percent impaired, limited or restricted   Other PT Primary Goal Status (W0981) At least 20 percent but less than 40 percent impaired, limited or restricted       Problem List Patient Active Problem List   Diagnosis Date Noted  . Breast cancer of upper-outer quadrant of left female breast 12/02/2013  . Complex regional pain syndrome I 12/02/2013  . Essential hypertension 09/05/2012  . Lower leg edema 09/05/2012  . Elevated C-reactive protein (CRP) 09/05/2012  . Dyslipidemia 09/05/2012  . History of radiation therapy     GRAY,CHERYL,PT 07-27-2014, 12:14 PM  [facility] [address] [place], [place], [address] Phone: [phone]   Fax:  [phone]

## 2014-07-11 ENCOUNTER — Other Ambulatory Visit: Payer: Self-pay | Admitting: General Surgery

## 2014-07-18 ENCOUNTER — Encounter: Payer: Self-pay | Admitting: Physical Therapy

## 2014-07-18 ENCOUNTER — Ambulatory Visit: Payer: Medicare Other | Attending: Adult Health | Admitting: Physical Therapy

## 2014-07-18 DIAGNOSIS — Z87898 Personal history of other specified conditions: Secondary | ICD-10-CM | POA: Insufficient documentation

## 2014-07-18 DIAGNOSIS — Z923 Personal history of irradiation: Secondary | ICD-10-CM | POA: Insufficient documentation

## 2014-07-18 DIAGNOSIS — R32 Unspecified urinary incontinence: Secondary | ICD-10-CM | POA: Insufficient documentation

## 2014-07-18 DIAGNOSIS — N8189 Other female genital prolapse: Secondary | ICD-10-CM | POA: Insufficient documentation

## 2014-07-18 DIAGNOSIS — E785 Hyperlipidemia, unspecified: Secondary | ICD-10-CM | POA: Diagnosis not present

## 2014-07-18 DIAGNOSIS — K219 Gastro-esophageal reflux disease without esophagitis: Secondary | ICD-10-CM | POA: Insufficient documentation

## 2014-07-18 DIAGNOSIS — L409 Psoriasis, unspecified: Secondary | ICD-10-CM | POA: Diagnosis not present

## 2014-07-18 DIAGNOSIS — N816 Rectocele: Secondary | ICD-10-CM | POA: Insufficient documentation

## 2014-07-18 DIAGNOSIS — I10 Essential (primary) hypertension: Secondary | ICD-10-CM | POA: Diagnosis not present

## 2014-07-18 DIAGNOSIS — K589 Irritable bowel syndrome without diarrhea: Secondary | ICD-10-CM | POA: Insufficient documentation

## 2014-07-18 DIAGNOSIS — Z90710 Acquired absence of both cervix and uterus: Secondary | ICD-10-CM | POA: Diagnosis not present

## 2014-07-18 NOTE — Patient Instructions (Signed)
Adduction: Hip - Knees Together With Pelvic Floor (Hook-Lying)   Lie with hips and knees bent, towel roll between knees.Exhale, ocean breath,  Lift pelvic floor while squeezing knees together. Hold for 2___ seconds. Rest for __5_ seconds. Repeat _5-10__ times. Do _1-2__ times a day. Remember to relax your buttocks.  Lower abdominal/core stability exercises  1. Practice your breathing technique: Inhale through your nose expanding your belly and rib cage. Try not to breathe into your chest. Exhale slowly and gradually out your mouth feeling a sense of softness to your body. Practice multiple times. This can be performed unlimited.  2. Finding the lower abdominals. Laying on your back with the knees bent, place your fingers just below your belly button. Using your breathing technique from above, on your exhale gently pull the belly button away from your fingertips without tensing any other muscles. Practice this 5x. Next, as you exhale, draw belly button inwards and hold onto it...then feel as if you are pulling that muscle across your pelvis like you are tightening a belt. This can be hard to do at first so be patient and practice. Do 5-10 reps 1-3 x day. Always recognize quality over quantity; if your abdominal muscles become tired you will notice you may tighten/contract other muscles. This is the time to take a break.   Practice this first laying on your back, then in sitting, progressing to standing and finally adding it to all your daily movements.   3. Finding your pelvic floor. Using the breathing technique above, when your exhale, this time draw your pelvic floor muscles up as if you were attempting to stop the flow of urination. Be careful NOT to tense any other muscles. This can be hard, BE PATIENT. Try to hold up to 10 seconds repeating 10x. Try 2x a day. Once you feel you are doing this well, add this contraction to exercise #2. First contracting your pelvic floor followed by lower  abdominals.  4. Adding leg movements. Add the following leg movements to challenge your ability to keep your core stable:  1. Single leg drop outs: Laying on your back with knees bent feet flat. Inhale,  dropping one knee outward KEEPING YOUR PELVIS STILL. Exhale as you bring the leg back, simultaneously performing your lower abdominal contraction. Do 5-10 on each leg.  2. Marching: While keeping your pelvis still, lift the right foot a few inches, put it down then lift left foot. This will mimic a march. Start slow to establish control. Once you have control you may speed it up. Do 10-20x. You MUST keep your lower abdominals contracted while you march. Breathe naturally   3. Single leg slides: Inhale while you slowly slide one leg out keeping your pelvis still. Only slide your leg as far as you can keep your pelvis still. Exhale as you bring the leg back to the start, contracting the lower abdominals as you do that. Keep your upper body relaxed. Do 5-10 on each side.   Copyright  VHI. All rights reserved.

## 2014-07-18 NOTE — Therapy (Signed)
[facility] [address] [place], [place], [address] Phone: [phone]   Fax:  [phone]  Physical Therapy Treatment  Patient Details  Name: [name] MRN: [id number] Date of Birth: 14-Dec-1945 Referring Provider:  Corine Shelter, PA-C  Encounter Date: 07/18/2014      PT End of Session - 07/18/14 1255    Visit Number 2   Date for PT Re-Evaluation 09/02/14   PT Start Time 1227   PT Stop Time 1307   PT Time Calculation (min) 40 min   Activity Tolerance Patient tolerated treatment well   Behavior During Therapy [facility] for tasks assessed/performed      Past Medical History  Diagnosis Date  . IBS (irritable bowel syndrome)   . Arthritis   . Breast cancer 10/18/2011    dcis, left breast, ER/PR +  . GERD (gastroesophageal reflux disease)   . H/O colonoscopy   . H/O bone density study   . Night sweats   . Sinus complaint   . Heartburn   . Psoriasis   . Anxiety   . Depression   . Hot flashes   . Dysrhythmia     palpitations  . Hypertension   . History of radiation therapy 12/24/11 -01/21/12    left breast  . Hypertension   . Hyperlipidemia   . Abnormal C-reactive protein   . Breast cancer, stage 1     left    Past Surgical History  Procedure Laterality Date  . Cataract extraction    . Tonsillectomy and adenoidectomy    . Dilation and curettage of uterus    . Abdominal hysterectomy    . Hand surgery      RSD  . Colonoscopy    . Upper gastrointestinal endoscopy    . Breast surgery  10/18/2011    LT br lumpectomy  . Nm myoview ltd  10/19/2008    normal  . Eye surgery      There were no vitals filed for this visit.  Visit Diagnosis:  Pelvic floor weakness in female      Subjective Assessment - 07/18/14 1232    Subjective Patient had two bowel movements yesterday.   Currently in Pain? No/denies                         [facility] Adult PT Treatment/Exercise - 07/18/14 0001    Lumbar Exercises: Supine   Ab Set --  Ta series given for HEP.    Other Supine Lumbar Exercises Pelvic floor contraction with ball squeeze 2x5   Other Supine Lumbar Exercises TA with 3 leg movements                PT Education - 07/18/14 1238    Education provided Yes   Education Details Pelvic floor and TA contractions for HEP   Person(s) Educated Patient   Methods Explanation;Demonstration;Tactile cues;Verbal cues;Handout   Comprehension Verbalized understanding;Returned demonstration;Verbal cues required          PT Short Term Goals - 07/18/14 1246    PT SHORT TERM GOAL #1   Title understand what lubricators and moisturizers to use for vaginal dryness   Time 4   Period Weeks   Status On-going   PT SHORT TERM GOAL #2   Title single leg stance for 6 seconds with minimal body sway due to increased strength   Time 4   Period Weeks   Status On-going  Will work on  visit 3   PT SHORT TERM GOAL #3   Title urinary leakage decreased >/= 25%   Period Weeks   Status On-going   PT SHORT TERM GOAL #4   Title understand how to have a bowel movement without straining to reduce the rectocele   Time 4   Period Weeks   Status Achieved           PT Long Term Goals - 07/08/14 1207    PT LONG TERM GOAL #1   Title single leg stance for 10 seconds with minimal body sway due to increased strength and balance   Time 8   Period Weeks   Status New   PT LONG TERM GOAL #2   Title pelvic floor contraction 4+/5 holding for 10 seconds so urinary leakage has decreased    Time 8   Period Weeks   Status New   PT LONG TERM GOAL #3   Title pain with intercourse decreased >/= 75% due to increased moisture in the vaginal area   Time 8   Period Weeks   Status New   PT LONG TERM GOAL #4   Title improvement of constipation >/= 60%   Time 8   Period Weeks   Status New               Plan - 07/18/14 1256    Clinical Impression Statement patient had some difficluty finding  TA muscle, but improved with practice.  Added this to HEP.   Pt will benefit from skilled therapeutic intervention in order to improve on the following deficits Pain;Decreased strength;Decreased mobility;Increased muscle spasms;Decreased balance;Decreased activity tolerance;Decreased endurance   Rehab Potential Good   Clinical Impairments Affecting Rehab Potential none   PT Frequency 1x / week   PT Duration 8 weeks   PT Treatment/Interventions Therapeutic activities;Moist Heat;Biofeedback;Balance training;Manual techniques;Neuromuscular re-education;Stair training;Cryotherapy;Electrical Stimulation;Functional mobility training   PT Next Visit Plan Add single leg balance ex to HEP   Consulted and Agree with Plan of Care Patient        Problem List Patient Active Problem List   Diagnosis Date Noted  . Breast cancer of upper-outer quadrant of left female breast 12/02/2013  . Complex regional pain syndrome I 12/02/2013  . Essential hypertension 09/05/2012  . Lower leg edema 09/05/2012  . Elevated C-reactive protein (CRP) 09/05/2012  . Dyslipidemia 09/05/2012  . History of radiation therapy     COCHRAN,JENNIFER, PTA 07/18/2014, 12:59 PM  [facility] [address] [place], [place], [address] Phone: [phone]   Fax:  [phone]

## 2014-07-26 ENCOUNTER — Encounter: Payer: Self-pay | Admitting: Physical Therapy

## 2014-07-26 ENCOUNTER — Ambulatory Visit: Payer: Medicare Other | Admitting: Physical Therapy

## 2014-07-26 DIAGNOSIS — N8189 Other female genital prolapse: Secondary | ICD-10-CM

## 2014-07-26 NOTE — Therapy (Signed)
[facility] [address], [place], [place], [address] Phone: [phone]   Fax:  [phone]  Physical Therapy Treatment  Patient Details  Name: [name] MRN: [id number] Date of Birth: [birth year] Referring Provider:  Holley Bouche, NP  Encounter Date: 07/26/2014      PT End of Session - 07/26/14 1113    Visit Number 3   Date for PT Re-Evaluation 09/02/14   PT Start Time 1100   PT Stop Time 1145   PT Time Calculation (min) 45 min   Activity Tolerance Patient tolerated treatment well   Behavior During Therapy [facility] for tasks assessed/performed      Past Medical History  Diagnosis Date  . IBS (irritable bowel syndrome)   . Arthritis   . Breast cancer 10/18/2011    dcis, left breast, ER/PR +  . GERD (gastroesophageal reflux disease)   . H/O colonoscopy   . H/O bone density study   . Night sweats   . Sinus complaint   . Heartburn   . Psoriasis   . Anxiety   . Depression   . Hot flashes   . Dysrhythmia     palpitations  . Hypertension   . History of radiation therapy 12/24/11 -01/21/12    left breast  . Hypertension   . Hyperlipidemia   . Abnormal C-reactive protein   . Breast cancer, stage 1     left    Past Surgical History  Procedure Laterality Date  . Cataract extraction    . Tonsillectomy and adenoidectomy    . Dilation and curettage of uterus    . Abdominal hysterectomy    . Hand surgery      RSD  . Colonoscopy    . Upper gastrointestinal endoscopy    . Breast surgery  10/18/2011    LT br lumpectomy  . Nm myoview ltd  10/19/2008    normal  . Eye surgery      There were no vitals filed for this visit.  Visit Diagnosis:  Pelvic floor weakness in female      Subjective Assessment - 07/26/14 1110    Subjective I have been sore in the lower abomen.     Patient Stated Goals increase strength of pelvic floor muscles to reduce urinary leakage.     Currently in Pain? Yes   Pain  Score 6    Pain Location Abdomen  Right lower quadrant   Pain Orientation Right   Pain Descriptors / Indicators Radiating   Pain Type Chronic pain   Pain Onset More than a month ago   Pain Frequency Intermittent   Aggravating Factors  not sure   Pain Relieving Factors heat   Multiple Pain Sites No                         [facility] Adult PT Treatment/Exercise - 07/26/14 0001    Lumbar Exercises: Supine   Ab Set 5 reps;5 seconds   AB Set Limitations feels contraction in abdomen   Other Supine Lumbar Exercises Pelvic floor contraction with ball squeeze 2x5   Other Supine Lumbar Exercises TA with 3 leg movements   Manual Therapy   Manual Therapy Joint mobilization;Massage   Joint Mobilization muscle energy technique to correct anteriorly rotated ilium   Massage soft tissue work to right psoas, right lower quadrant, obturator internist, manually stretched right hip in supine  PT Education - 07/26/14 1151    Education provided Yes   Education Details moisturizer, lubricants, how to perform pinching of lips to improve blood flow   Person(s) Educated Patient   Methods Explanation;Handout;Verbal cues   Comprehension Verbalized understanding          PT Short Term Goals - 07/26/14 1140    PT SHORT TERM GOAL #1   Title understand what lubricators and moisturizers to use for vaginal dryness   Time 4   Period Weeks   Status Achieved   PT SHORT TERM GOAL #2   Title single leg stance for 6 seconds with minimal body sway due to increased strength   Time 4   Period Weeks   Status On-going   PT SHORT TERM GOAL #3   Title urinary leakage decreased >/= 25%   Time 4   Period Weeks   Status Achieved           PT Long Term Goals - 07/08/14 1207    PT LONG TERM GOAL #1   Title single leg stance for 10 seconds with minimal body sway due to increased strength and balance   Time 8   Period Weeks   Status New   PT LONG TERM GOAL #2   Title  pelvic floor contraction 4+/5 holding for 10 seconds so urinary leakage has decreased    Time 8   Period Weeks   Status New   PT LONG TERM GOAL #3   Title pain with intercourse decreased >/= 75% due to increased moisture in the vaginal area   Time 8   Period Weeks   Status New   PT LONG TERM GOAL #4   Title improvement of constipation >/= 60%   Time 8   Period Weeks   Status New               Plan - 07/26/14 1144    Clinical Impression Statement Patient is a 69 year old female with right lower quadrant pain.  Patient urinary leakage has improved by 25%, constipation has improved by 40%, and able to perform lower abdominal exerciess correctly. Patient has met STG # 1,3,4. Patient continues to feel unsteady due to right hip weakness and right lower quadrant pain.      Pt will benefit from skilled therapeutic intervention in order to improve on the following deficits Pain;Decreased strength;Decreased mobility;Increased muscle spasms;Decreased balance;Decreased activity tolerance;Decreased endurance   Rehab Potential Good   Clinical Impairments Affecting Rehab Potential none   PT Frequency 1x / week   PT Duration 8 weeks   PT Treatment/Interventions Therapeutic activities;Moist Heat;Biofeedback;Balance training;Manual techniques;Neuromuscular re-education;Stair training;Cryotherapy;Electrical Stimulation;Functional mobility training   PT Next Visit Plan continue with soft tissue work, hip abduction, single leg stance   PT Home Exercise Plan single leg stance and hip abduction   Consulted and Agree with Plan of Care Patient        Problem List Patient Active Problem List   Diagnosis Date Noted  . Breast cancer of upper-outer quadrant of left female breast 12/02/2013  . Complex regional pain syndrome I 12/02/2013  . Essential hypertension 09/05/2012  . Lower leg edema 09/05/2012  . Elevated C-reactive protein (CRP) 09/05/2012  . Dyslipidemia 09/05/2012  . History of  radiation therapy     GRAY,CHERYL,PT 07/26/2014, 11:52 AM  [facility] [address], [place], [place], [address] Phone: [phone]   Fax:  [phone]

## 2014-07-26 NOTE — Patient Instructions (Signed)
Vaginal Lubricators Here is a list of some suggested lubricators you can use for intercourse. Use the most hypoallergenic product.  You can place on you or your partner.  Astroglide natural in green box K-Y Intrique- silicone base Good Clean Love Slippery Stuff Sylk, Sliquid Natural H2O ( good  if frequent UTI's) Blossom Organics (www.blossom-organics.com) Luvena, Coconut oil Things to avoid in lubricants are glycerin, warming gels, tingling gels, icing or cooling gels, and scented gels.  Also avoid Vaseline. Vaginal Moisturizers Moisturizers are used in the vagina to hydrate the mucous membrane that makes up the vaginal canal. Once placed up inside the vagina, it will last between two to three days. Use 2-3 times per week. Replens; Feminease Me Again; K-Y liquid beads Key-E Suppositories (amazon and Whole foods); Hyalo-D (hayalogyn.com) Samul Dada; Coconut oil Things to avoid in moisturizers are glycerin and fragrance.   Patient was educated on lubricants and moisturizers and how to use them. Educated on how to perform pinching of the labia minora to improve blood flow and  Decrease dryness.

## 2014-08-02 ENCOUNTER — Ambulatory Visit: Payer: Medicare Other | Admitting: Physical Therapy

## 2014-08-02 ENCOUNTER — Encounter: Payer: Self-pay | Admitting: Physical Therapy

## 2014-08-02 DIAGNOSIS — N8189 Other female genital prolapse: Secondary | ICD-10-CM | POA: Diagnosis not present

## 2014-08-02 NOTE — Patient Instructions (Addendum)
Hip Flexor Stretch   Lying on back near edge of bed, bend one leg, foot flat. Hang other leg over edge, relaxed, thigh resting entirely on bed for _30 seconds. Repeat _2___ times. Do _1___ sessions per day. Use a strap or sheet to pull right ankle toward you.  Advanced Exercise: Bend knee back keeping thigh in contact with bed.  http://gt2.exer.us/347   Copyright  VHI. All rights reserved.  Piriformis Stretch, Sitting   Sit, one ankle on opposite knee, same-side hand on crossed knee. Push down on knee, keeping spine straight. Lean torso forward, with flat back, until tension is felt in hamstrings and gluteals of crossed-leg side. Hold _30__ seconds.  Repeat _2__ times per session. Do ___ sessions per day. 1 Copyright  VHI. All rights reserved.

## 2014-08-02 NOTE — Therapy (Signed)
[facility] [address] [place], [place], [address] Phone: [phone]   Fax:  [phone]  Physical Therapy Treatment  Patient Details  Name: [name] MRN: [id number] Date of Birth: [birth year] Referring Provider:  Holley Bouche, NP  Encounter Date: 08/02/2014      PT End of Session - 08/02/14 1141    Visit Number 4   Date for PT Re-Evaluation 09/02/14   PT Start Time 1100   PT Stop Time 1141   PT Time Calculation (min) 41 min   Activity Tolerance Patient tolerated treatment well   Behavior During Therapy [facility] for tasks assessed/performed      Past Medical History  Diagnosis Date  . IBS (irritable bowel syndrome)   . Arthritis   . Breast cancer 10/18/2011    dcis, left breast, ER/PR +  . GERD (gastroesophageal reflux disease)   . H/O colonoscopy   . H/O bone density study   . Night sweats   . Sinus complaint   . Heartburn   . Psoriasis   . Anxiety   . Depression   . Hot flashes   . Dysrhythmia     palpitations  . Hypertension   . History of radiation therapy 12/24/11 -01/21/12    left breast  . Hypertension   . Hyperlipidemia   . Abnormal C-reactive protein   . Breast cancer, stage 1     left    Past Surgical History  Procedure Laterality Date  . Cataract extraction    . Tonsillectomy and adenoidectomy    . Dilation and curettage of uterus    . Abdominal hysterectomy    . Hand surgery      RSD  . Colonoscopy    . Upper gastrointestinal endoscopy    . Breast surgery  10/18/2011    LT br lumpectomy  . Nm myoview ltd  10/19/2008    normal  . Eye surgery      There were no vitals filed for this visit.  Visit Diagnosis:  Pelvic floor weakness in female      Subjective Assessment - 08/02/14 1102    Subjective I am still having trouble with constipation. Urinary leakage has improved.  Patient reports when she has the urge she has to go.  The intensity of the right lower quadrant  has not changed but lasts for short period.    Diagnostic tests MRI of to show how she has a bowel movement   Patient Stated Goals increase strength of pelvic floor muscles to reduce urinary leakage.     Currently in Pain? Yes   Pain Score 7    Pain Location Abdomen  right quadrant   Pain Orientation Right   Pain Descriptors / Indicators Radiating   Pain Type Chronic pain   Pain Onset More than a month ago   Pain Frequency Intermittent   Aggravating Factors  Not sure   Pain Relieving Factors jeat   Multiple Pain Sites No                      Pelvic Floor Special Questions - 08/02/14 0001    Pelvic Floor Internal Exam Patient confirms identification and approved physical therapist to perform internal soft tissue work to    Exam Type Vaginal   Strength good squeeze, good lift, able to hold agaisnt strong resistance           [facility] Adult PT Treatment/Exercise - 08/02/14 0001  Manual Therapy   Manual Therapy Soft tissue mobilization;Internal Pelvic Floor;Passive ROM   Soft tissue mobilization reviewed with patient on how to perform abdominal massage to reduce constipation; soft tissue work to right psoas, right hip adductor, right quads   Internal Pelvic Floor to right obturator internist, levator ani, and piriformis   Passive ROM right hip extension and abduction                PT Education - 08/02/14 1140    Education provided Yes   Education Details piriformis stretch, hip flexor stretch   Person(s) Educated Patient   Methods Explanation;Demonstration;Tactile cues;Verbal cues;Handout   Comprehension Returned demonstration;Verbalized understanding          PT Short Term Goals - 08/02/14 1104    PT SHORT TERM GOAL #2   Title single leg stance for 6 seconds with minimal body sway due to increased strength   Time 4   Period Weeks   Status Achieved   PT SHORT TERM GOAL #3   Title urinary leakage decreased >/= 25%   Time 4   Period Weeks    Status Achieved           PT Long Term Goals - 08/02/14 1106    PT LONG TERM GOAL #1   Title single leg stance for 10 seconds with minimal body sway due to increased strength and balance   Time 8   Period Weeks   Status On-going  6 seconds   PT LONG TERM GOAL #3   Title pain with intercourse decreased >/= 75% due to increased moisture in the vaginal area   Time 8   Period Weeks   Status On-going  40% better   PT LONG TERM GOAL #4   Title improvement of constipation >/= 60%   Time 8   Period Weeks   Status New  no changes               Plan - 08/02/14 1142    Clinical Impression Statement Patient urinary leakage has improved 40%.  Patient leak more with coughing and sneezing. Patient reports pain with intercourse decreased by 40%.  Patient is having right hip and right lower quadrant pain.  Patient has trigger points in right hip adductores, right piriformis, right levator ani, and right obturator internist.  Patient right hip flexor and piriformis is tight.     Pt will benefit from skilled therapeutic intervention in order to improve on the following deficits Pain;Decreased strength;Decreased mobility;Increased muscle spasms;Decreased balance;Decreased activity tolerance;Decreased endurance   Rehab Potential Good   Clinical Impairments Affecting Rehab Potential none   PT Frequency 1x / week   PT Duration 8 weeks   PT Treatment/Interventions Therapeutic activities;Moist Heat;Biofeedback;Balance training;Manual techniques;Neuromuscular re-education;Stair training;Cryotherapy;Electrical Stimulation;Functional mobility training   PT Next Visit Plan soft tissue work, pelvic floor strength   PT Home Exercise Plan current HEP   Consulted and Agree with Plan of Care Patient        Problem List Patient Active Problem List   Diagnosis Date Noted  . Breast cancer of upper-outer quadrant of left female breast 12/02/2013  . Complex regional pain syndrome I 12/02/2013  .  Essential hypertension 09/05/2012  . Lower leg edema 09/05/2012  . Elevated C-reactive protein (CRP) 09/05/2012  . Dyslipidemia 09/05/2012  . History of radiation therapy     Ilan Kahrs,PT 08/02/2014, 11:45 AM  [facility] [address], [place], [place], [address] Phone: [phone]  Fax:  [phone]

## 2014-08-09 ENCOUNTER — Encounter: Payer: Self-pay | Admitting: Physical Therapy

## 2014-08-09 ENCOUNTER — Ambulatory Visit: Payer: Medicare Other | Admitting: Physical Therapy

## 2014-08-09 DIAGNOSIS — N8189 Other female genital prolapse: Secondary | ICD-10-CM | POA: Diagnosis not present

## 2014-08-09 NOTE — Therapy (Signed)
[facility] [address] [place], [place], [address] Phone: [phone]   Fax:  [phone]  Physical Therapy Treatment  Patient Details  Name: [name] MRN: [id number] Date of Birth: 1945/08/20 Referring Provider:  Holley Bouche, NP  Encounter Date: 08/09/2014      PT End of Session - 08/09/14 1110    Visit Number 5   Number of Visits 10  Medicare   Date for PT Re-Evaluation 09/02/14   PT Start Time 1100   PT Stop Time 1138   PT Time Calculation (min) 38 min   Activity Tolerance Patient tolerated treatment well   Behavior During Therapy [facility] for tasks assessed/performed      Past Medical History  Diagnosis Date  . IBS (irritable bowel syndrome)   . Arthritis   . Breast cancer 10/18/2011    dcis, left breast, ER/PR +  . GERD (gastroesophageal reflux disease)   . H/O colonoscopy   . H/O bone density study   . Night sweats   . Sinus complaint   . Heartburn   . Psoriasis   . Anxiety   . Depression   . Hot flashes   . Dysrhythmia     palpitations  . Hypertension   . History of radiation therapy 12/24/11 -01/21/12    left breast  . Hypertension   . Hyperlipidemia   . Abnormal C-reactive protein   . Breast cancer, stage 1     left    Past Surgical History  Procedure Laterality Date  . Cataract extraction    . Tonsillectomy and adenoidectomy    . Dilation and curettage of uterus    . Abdominal hysterectomy    . Hand surgery      RSD  . Colonoscopy    . Upper gastrointestinal endoscopy    . Breast surgery  10/18/2011    LT br lumpectomy  . Nm myoview ltd  10/19/2008    normal  . Eye surgery      There were no vitals filed for this visit.  Visit Diagnosis:  Pelvic floor weakness in female      Subjective Assessment - 08/09/14 1111    Subjective I seeing the urogyno on thursday. Patient reports urinary leakage is better.  leak with coughing and when waiting to go to the bathroom.  i have had a pain in the right side from the back to the right lower quadrant. Right lower quadrant pain since intial evaluation improved by 50%.    Diagnostic tests MRI of to show how she has a bowel movement   Patient Stated Goals increase strength of pelvic floor muscles to reduce urinary leakage.     Currently in Pain? Yes   Pain Score 6    Pain Location Abdomen  right lower quadrant   Pain Orientation Right   Pain Descriptors / Indicators Radiating   Pain Type Chronic pain   Pain Radiating Towards to back   Pain Onset More than a month ago   Pain Frequency Intermittent   Aggravating Factors  Not sure   Pain Relieving Factors heat   Multiple Pain Sites No                                 PT Education - 08/09/14 1133    Education provided Yes   Education Details pelvic floor contraction with coughing, sneezing, laughing, marching, tandem stance, and  single leg stance   Person(s) Educated Patient   Methods Explanation;Demonstration;Tactile cues;Verbal cues;Handout   Comprehension Returned demonstration;Verbalized understanding          PT Short Term Goals - 08/02/14 1104    PT SHORT TERM GOAL #2   Title single leg stance for 6 seconds with minimal body sway due to increased strength   Time 4   Period Weeks   Status Achieved   PT SHORT TERM GOAL #3   Title urinary leakage decreased >/= 25%   Time 4   Period Weeks   Status Achieved           PT Long Term Goals - 08/09/14 1136    PT LONG TERM GOAL #1   Title single leg stance for 10 seconds with minimal body sway due to increased strength and balance   Period Weeks   Status On-going  moderate trunk sway   PT LONG TERM GOAL #2   Title pelvic floor contraction 4+/5 holding for 10 seconds so urinary leakage has decreased    Time 8   Period Weeks   Status New   PT LONG TERM GOAL #3   Title pain with intercourse decreased >/= 75% due to increased moisture in the vaginal area   Time 8    Period Weeks   Status On-going  60% better   PT LONG TERM GOAL #4   Title improvement of constipation >/= 60%   Time 8   Period Weeks   Status On-going  60% better               Plan - 08/09/14 1138    Clinical Impression Statement Patient is a 69 year old female with urinary incontinence.  Patient reports urinary leakage has improved 60% and will keak with coughing and with urge.  Patient reports pain with intercourse and dryness has improved by 60%. Patient reports constipation has improved by 50%.  Patient right quadrant pain has decreased by 50% but it is still a problem.  Patient benefits with physical therpay to work on pelvic floor strength and right lower quadrant pain.    Pt will benefit from skilled therapeutic intervention in order to improve on the following deficits Pain;Decreased strength;Decreased mobility;Increased muscle spasms;Decreased balance;Decreased activity tolerance;Decreased endurance   Rehab Potential Good   Clinical Impairments Affecting Rehab Potential none   PT Frequency 1x / week   PT Duration 8 weeks   PT Treatment/Interventions Therapeutic activities;Moist Heat;Biofeedback;Balance training;Manual techniques;Neuromuscular re-education;Stair training;Cryotherapy;Electrical Stimulation;Functional mobility training   PT Next Visit Plan soft tissue work, pelvic floor strength   PT Home Exercise Plan current HEP   Consulted and Agree with Plan of Care Patient        Problem List Patient Active Problem List   Diagnosis Date Noted  . Breast cancer of upper-outer quadrant of left female breast 12/02/2013  . Complex regional pain syndrome I 12/02/2013  . Essential hypertension 09/05/2012  . Lower leg edema 09/05/2012  . Elevated C-reactive protein (CRP) 09/05/2012  . Dyslipidemia 09/05/2012  . History of radiation therapy     Ugonna Keirsey,PT 08/09/2014, 11:43 AM  [facility] [address] [place], [place], [address] Phone: [phone]   Fax:  [phone]

## 2014-08-09 NOTE — Patient Instructions (Signed)
Cough: Phase 3 (Sitting)   Squeeze pelvic floor and hold. Inhale. Lean shoulders forward. Cough. Relax. Repeat _5__ times. Do _1__ times a day.  Copyright  VHI. All rights reserved.  Laugh: Phase 3 (Sitting)   Squeeze pelvic floor and hold. Inhale. Lean shoulders forward. Laugh. Relax. Repeat 5___ times. Do _1__ times a day.  Copyright  VHI. All rights reserved.  Sneeze: Phase 3 (Sitting)   Squeeze pelvic floor and hold. Inhale. Lean shoulders forward. Pretend to sneeze. Relax. Repeat _5__ times. Do _1__ times a day.  Copyright  VHI. All rights reserved.  Walking: Phase 5   Squeeze pelvic floor and hold. March in place for _5 steps Rest for _5__ seconds. Repeat _3__ times. Do __1_ times a day. Right knee follows right shoulder. Do not hold breath  Copyright  VHI. All rights reserved.  Feet Heel-Toe "Tandem", Varied Arm Positions - Eyes Open   Contract pelvic floor. With eyes open, right foot directly in front of the other, arms at side look straight ahead at a stationary object. Do at counter.  Hold __15-30__ seconds. Repeat __2__ times per session. Do __1__ sessions per day. Then switch legs.  Copyright  VHI. All rights reserved.  Single Leg - Eyes Open   Contract pelvic floor. Holding support, lift right leg while maintaining balance over other leg. Progress to removing hands from support surface for longer periods of time. Hold_10___ seconds. Repeat _3___ times per session. Do _1___ sessions per day. Then do other leg Copyright  VHI. All rights reserved.

## 2014-08-16 ENCOUNTER — Encounter: Payer: Self-pay | Admitting: Physical Therapy

## 2014-08-16 ENCOUNTER — Ambulatory Visit: Payer: Medicare Other | Admitting: Physical Therapy

## 2014-08-16 DIAGNOSIS — N8189 Other female genital prolapse: Secondary | ICD-10-CM

## 2014-08-16 NOTE — Therapy (Signed)
[facility] [address] [place], [place], [address] Phone: [phone]   Fax:  [phone]  Physical Therapy Treatment  Patient Details  Name: [name] MRN: [id number] Date of Birth: 1945-07-27 Referring Provider:  Holley Bouche, NP  Encounter Date: 08/16/2014      PT End of Session - 08/16/14 1113    Visit Number 6   Number of Visits 10  Medicare   Date for PT Re-Evaluation 09/02/14   PT Start Time 1114   PT Stop Time 1145   PT Time Calculation (min) 31 min   Activity Tolerance Patient tolerated treatment well   Behavior During Therapy [facility] for tasks assessed/performed      Past Medical History  Diagnosis Date  . IBS (irritable bowel syndrome)   . Arthritis   . Breast cancer 10/18/2011    dcis, left breast, ER/PR +  . GERD (gastroesophageal reflux disease)   . H/O colonoscopy   . H/O bone density study   . Night sweats   . Sinus complaint   . Heartburn   . Psoriasis   . Anxiety   . Depression   . Hot flashes   . Dysrhythmia     palpitations  . Hypertension   . History of radiation therapy 12/24/11 -01/21/12    left breast  . Hypertension   . Hyperlipidemia   . Abnormal C-reactive protein   . Breast cancer, stage 1     left    Past Surgical History  Procedure Laterality Date  . Cataract extraction    . Tonsillectomy and adenoidectomy    . Dilation and curettage of uterus    . Abdominal hysterectomy    . Hand surgery      RSD  . Colonoscopy    . Upper gastrointestinal endoscopy    . Breast surgery  10/18/2011    LT br lumpectomy  . Nm myoview ltd  10/19/2008    normal  . Eye surgery      There were no vitals filed for this visit.  Visit Diagnosis:  Pelvic floor weakness in female      Subjective Assessment - 08/16/14 1115    Subjective My doctor wants more documentation on urine and bowel.  I have a rectocele.    Diagnostic tests MRI of to show how she has a bowel  movement   Patient Stated Goals increase strength of pelvic floor muscles to reduce urinary leakage.     Currently in Pain? No/denies      Treatment consisted of therapist educating patient on how to have a bowel movement correctly and how she is straightening the colon and pushing on the rectcele to have a full bowel movement. Pt used a video to explain what  A rectocele is and how pushing on it will help to empty the pocket.  Patient verbally understands.                            PT Education - 08/16/14 1138    Education provided Yes   Education Details education with video on rectocele, reviewed abdominal massage, toileting technique   Person(s) Educated Patient   Methods Demonstration;Explanation;Tactile cues;Verbal cues;Handout   Comprehension Returned demonstration;Verbalized understanding          PT Short Term Goals - 08/02/14 1104    PT SHORT TERM GOAL #2   Title single leg stance for 6 seconds with minimal body  sway due to increased strength   Time 4   Period Weeks   Status Achieved   PT SHORT TERM GOAL #3   Title urinary leakage decreased >/= 25%   Time 4   Period Weeks   Status Achieved           PT Long Term Goals - 08/16/14 1150    PT LONG TERM GOAL #1   Title single leg stance for 10 seconds with minimal body sway due to increased strength and balance   Time 8   Period Weeks   Status On-going   PT LONG TERM GOAL #2   Title pelvic floor contraction 4+/5 holding for 10 seconds so urinary leakage has decreased    Time 8   Period Weeks   Status New   PT LONG TERM GOAL #4   Title improvement of constipation >/= 60%   Time 8   Period Weeks   Status On-going  working with a Elizabeth - 08/16/14 1144    Clinical Impression Statement Patient ia a 69 year old female with urinary leakage decreased >/= 80%.  Patient is having difficulty with straining that is putting pressure on the pelvic floor while having  a bowel movement.  Patient has learned the correct way to have a bowel movement using a squatty potty.  Patient needed verbal anc tactlie cues to perform abdominal massage correctly due to her giving herself bruises .  Patient was not performing circular massage  instead placing deep pressure into the abdomen. Patient will come mone moer time to review program then be discharged.    Pt will benefit from skilled therapeutic intervention in order to improve on the following deficits Pain;Decreased strength;Decreased mobility;Increased muscle spasms;Decreased balance;Decreased activity tolerance;Decreased endurance   Rehab Potential Good   Clinical Impairments Affecting Rehab Potential none   PT Frequency 1x / week   PT Duration 8 weeks   PT Treatment/Interventions Therapeutic activities;Moist Heat;Biofeedback;Balance training;Manual techniques;Neuromuscular re-education;Stair training;Cryotherapy;Electrical Stimulation;Functional mobility training   PT Next Visit Plan review HEP.  D/C next visit   PT Home Exercise Plan current HEP   Consulted and Agree with Plan of Care Patient        Problem List Patient Active Problem List   Diagnosis Date Noted  . Breast cancer of upper-outer quadrant of left female breast 12/02/2013  . Complex regional pain syndrome I 12/02/2013  . Essential hypertension 09/05/2012  . Lower leg edema 09/05/2012  . Elevated C-reactive protein (CRP) 09/05/2012  . Dyslipidemia 09/05/2012  . History of radiation therapy     GRAY,CHERYL,PT 08/16/2014, 11:54 AM  [facility] [address], [place], [place], [address] Phone: [phone]   Fax:  [phone]

## 2014-08-16 NOTE — Patient Instructions (Addendum)
Toileting Techniques for Bowel Movements (Defecation) Using your belly (abdomen) and pelvic floor muscles to have a bowel movement is usually instinctive.  Sometimes people can have problems with these muscles and have to relearn proper defecation (emptying) techniques.  If you have weakness in your muscles, organs that are falling out, decreased sensation in your pelvis, or ignore your urge to go, you may find yourself straining to have a bowel movement.  You are straining if you are: . holding your breath or taking in a huge gulp of air and holding it  . keeping your lips and jaw tensed and closed tightly . turning red in the face because of excessive pushing or forcing . developing or worsening your  hemorrhoids . getting faint while pushing . not emptying completely and have to defecate many times a day  If you are straining, you are actually making it harder for yourself to have a bowel movement.  Many people find they are pulling up with the pelvic floor muscles and closing off instead of opening the anus. Due to lack pelvic floor relaxation and coordination the abdominal muscles, one has to work harder to push the feces out.  Many people have never been taught how to defecate efficiently and effectively.  Notice what happens to your body when you are having a bowel movement.  While you are sitting on the toilet pay attention to the following areas: . Jaw and mouth position . Angle of your hips   . Whether your feet touch the ground or not . Arm placement  . Spine position . Waist . Belly tension . Anus (opening of the anal canal)  An Evacuation/Defecation Plan   Here are the 4 basic points:  1. Lean forward enough for your elbows to rest on your knees 2. Support your feet on the floor or use a low stool if your feet don't touch the floor  3. Push out your belly as if you have swallowed a beach ball-you should feel a widening of your waist 4. Open and relax your pelvic floor muscles,  rather than tightening around the anus      The following conditions my require modifications to your toileting posture:  . If you have had surgery in the past that limits your back, hip, pelvic, knee or ankle flexibility . Constipation   Your healthcare practitioner may make the following additional suggestions and adjustments:  1) Sit on the toilet  a) Make sure your feet are supported. b) Notice your hip angle and spine position-most people find it effective to lean forward or raise their knees, which can help the muscles around the anus to relax  c) When you lean forward, place your forearms on your thighs for support  2) Relax suggestions a) Breath deeply in through your nose and out slowly through your mouth as if you are smelling the flowers and blowing out the candles. b) To become aware of how to relax your muscles, contracting and releasing muscles can be helpful.  Pull your pelvic floor muscles in tightly by using the image of holding back gas, or closing around the anus (visualize making a circle smaller) and lifting the anus up and in.  Then release the muscles and your anus should drop down and feel open. Repeat 5 times ending with the feeling of relaxation. c) Keep your pelvic floor muscles relaxed; let your belly bulge out. d) The digestive tract starts at the mouth and ends at the anal opening, so be   sure to relax both ends of the tube.  Place your tongue on the roof of your mouth with your teeth separated.  This helps relax your mouth and will help to relax the anus at the same time.  3) Empty (defecation) a) Keep your pelvic floor and sphincter relaxed, then bulge your anal muscles.  Make the anal opening wide.  b) Stick your belly out as if you have swallowed a beach ball. c) Make your belly wall hard using your belly muscles while continuing to breathe. Doing this makes it easier to open your anus. d) Breath out and give a grunt (or try using other sounds such as  ahhhh, shhhhh, ohhhh or grrrrrrr).  4) Finish a) As you finish your bowel movement, pull the pelvic floor muscles up and in.  This will leave your anus in the proper place rather than remaining pushed out and down. If you leave your anus pushed out and down, it will start to feel as though that is normal and give you incorrect signals about needing to have a bowel movement.    Kylei Purington, PT [facility] [address] [place], [place] [address] About Abdominal Massage  Abdominal massage, also called external colon massage, is a self-treatment circular massage technique that can reduce and eliminate gas and ease constipation. The colon naturally contracts in waves in a clockwise direction starting from inside the right hip, moving up toward the ribs, across the belly, and down inside the left hip.  When you perform circular abdominal massage, you help stimulate your colon's normal wave pattern of movement called peristalsis.  It is most beneficial when done after eating.  Positioning You can practice abdominal massage with oil while lying down, or in the shower with soap.  Some people find that it is just as effective to do the massage through clothing while sitting or standing.  How to Massage Start by placing your finger tips or knuckles on your right side, just inside your hip bone.  . Make small circular movements while you move upward toward your rib cage.   . Once you reach the bottom right side of your rib cage, take your circular movements across to the left side of the bottom of your rib cage.  . Next, move downward until you reach the inside of your left hip bone.  This is the path your feces travel in your colon. . Continue to perform your abdominal massage in this pattern for 10 minutes each day.     You can apply as much pressure as is comfortable in your massage.  Start gently and build pressure as you continue to practice.  Notice any areas of  pain as you massage; areas of slight pain may be relieved as you massage, but if you have areas of significant or intense pain, consult with your healthcare provider.  Other Considerations . General physical activity including bending and stretching can have a beneficial massage-like effect on the colon.  Deep breathing can also stimulate the colon because breathing deeply activates the same nervous system that supplies the colon.   . Abdominal massage should always be used in combination with a bowel-conscious diet that is high in the proper type of fiber for you, fluids (primarily water), and a regular exercise program.  

## 2014-08-19 ENCOUNTER — Encounter: Payer: Self-pay | Admitting: Adult Health

## 2014-08-19 NOTE — Progress Notes (Signed)
A birthday card was mailed to the patient today on behalf of the Survivorship Program at [facility].   Gretchen Dawson, NP Survivorship Program [facility] [phone]  

## 2014-08-23 ENCOUNTER — Ambulatory Visit: Admission: RE | Admit: 2014-08-23 | Payer: Medicare Other | Source: Ambulatory Visit

## 2014-08-23 ENCOUNTER — Encounter: Payer: Self-pay | Admitting: Physical Therapy

## 2014-08-23 ENCOUNTER — Ambulatory Visit
Admission: RE | Admit: 2014-08-23 | Discharge: 2014-08-23 | Disposition: A | Discharge status: Home / Self Care | Payer: Medicare Other | Source: Ambulatory Visit | Attending: General Surgery | Admitting: General Surgery

## 2014-08-23 ENCOUNTER — Ambulatory Visit: Payer: Medicare Other | Attending: Adult Health | Admitting: Physical Therapy

## 2014-08-23 DIAGNOSIS — N8189 Other female genital prolapse: Secondary | ICD-10-CM | POA: Diagnosis not present

## 2014-08-23 NOTE — Therapy (Signed)
[facility] [address], [place], [place], [address] Phone: [phone]   Fax:  [phone]  Physical Therapy Treatment  Patient Details  Name: [name] MRN: [id number] Date of Birth: Feb 25, 1946 Referring Provider:  Holley Bouche, NP  Encounter Date: 08/23/2014      PT End of Session - 08/23/14 1141    Visit Number 7   Date for PT Re-Evaluation 09/02/14   PT Start Time 1100   PT Stop Time 1141   PT Time Calculation (min) 41 min   Activity Tolerance Patient tolerated treatment well   Behavior During Therapy [facility] for tasks assessed/performed      Past Medical History  Diagnosis Date  . IBS (irritable bowel syndrome)   . Arthritis   . Breast cancer 10/18/2011    dcis, left breast, ER/PR +  . GERD (gastroesophageal reflux disease)   . H/O colonoscopy   . H/O bone density study   . Night sweats   . Sinus complaint   . Heartburn   . Psoriasis   . Anxiety   . Depression   . Hot flashes   . Dysrhythmia     palpitations  . Hypertension   . History of radiation therapy 12/24/11 -01/21/12    left breast  . Hypertension   . Hyperlipidemia   . Abnormal C-reactive protein   . Breast cancer, stage 1     left    Past Surgical History  Procedure Laterality Date  . Cataract extraction    . Tonsillectomy and adenoidectomy    . Dilation and curettage of uterus    . Abdominal hysterectomy    . Hand surgery      RSD  . Colonoscopy    . Upper gastrointestinal endoscopy    . Breast surgery  10/18/2011    LT br lumpectomy  . Nm myoview ltd  10/19/2008    normal  . Eye surgery      There were no vitals filed for this visit.  Visit Diagnosis:  Pelvic floor weakness in female      Subjective Assessment - 08/23/14 1128    Subjective I am having surgery to correct rectocele, fix the urethra that is not connected to the bone, and fix the prolapse.    Diagnostic tests MRI of to show how she has a bowel  movement   Patient Stated Goals increase strength of pelvic floor muscles to reduce urinary leakage.     Currently in Pain? Yes   Pain Score 7    Pain Location Abdomen  right    Pain Orientation Right   Pain Descriptors / Indicators --  ovary pain   Pain Type Chronic pain   Pain Onset More than a month ago   Pain Frequency Intermittent   Aggravating Factors  not sure   Pain Relieving Factors heat   Multiple Pain Sites No            [facility] PT Assessment - 08/23/14 0001    Assessment   Medical Diagnosis Pelvic floor weakness   Onset Date/Surgical Date 01/17/15   Precautions   Precautions Other (comment)  No ultrasound    Balance Screen   Has the patient fallen in the past 6 months No   Has the patient had a decrease in activity level because of a fear of falling?  No   Is the patient reluctant to leave their home because of a fear of falling?  No  Prior Function   Level of Independence Independent with basic ADLs   Observation/Other Assessments   Other Surveys  --  therapist discrection due to patient progress toward goals   Strength   Overall Strength Comments right hip abd 4+/5, extension 4+/5, left hip abd 4+/5                  Pelvic Floor Special Questions - 08/23/14 0001    External Perineal Exam dryness externally   Skin Integrity Intact   Pelvic Floor Internal Exam Patient confirms identification and approved physical therapist to perform internal soft tissue work to    Exam Type Vaginal   Strength good squeeze, good lift, able to hold agaisnt strong resistance   Strength # of reps 4   Strength # of seconds 10           [facility] Adult PT Treatment/Exercise - 08/23/14 0001    Lumbar Exercises: Standing   Other Standing Lumbar Exercises single leg stance 10 sec bil.    Other Standing Lumbar Exercises standing hip abduction  10 bil.    Lumbar Exercises: Seated   Other Seated Lumbar Exercises pelvic floor contraction with sneeze, cough   Lumbar  Exercises: Supine   Ab Set 5 reps;5 seconds   Other Supine Lumbar Exercises Pelvic floor contraction with ball squeeze 2x5; supine hip flexor stretch   Other Supine Lumbar Exercises TA with 3 leg movements                PT Education - 08/23/14 1140    Education provided Yes   Education Details reviewed patient past HEP and she is able to perform correctly. PT instructed patient to do HEP until her surgery.  After surgery no exercise until MD gives her permission.   Person(s) Educated Patient   Methods Explanation;Demonstration   Comprehension Verbalized understanding;Returned demonstration          PT Short Term Goals - 08/23/14 1125    PT SHORT TERM GOAL #1   Title understand what lubricators and moisturizers to use for vaginal dryness   Time 4   Period Weeks   Status Achieved   PT SHORT TERM GOAL #2   Title single leg stance for 6 seconds with minimal body sway due to increased strength   Time 4   Period Weeks   Status Achieved   PT SHORT TERM GOAL #3   Title urinary leakage decreased >/= 25%   Time 4   Period Weeks   Status Achieved   PT SHORT TERM GOAL #4   Title understand how to have a bowel movement without straining to reduce the rectocele   Time 4   Period Weeks   Status Achieved           PT Long Term Goals - 08/23/14 1125    PT LONG TERM GOAL #1   Title single leg stance for 10 seconds with minimal body sway due to increased strength and balance   Time 8   Period Weeks   Status Achieved   PT LONG TERM GOAL #2   Title pelvic floor contraction 4+/5 holding for 10 seconds so urinary leakage has decreased    Time 8   Period Weeks   Status Partially Met  strength is 4/5 hold 10 seconds   PT LONG TERM GOAL #3   Title pain with intercourse decreased >/= 75% due to increased moisture in the vaginal area   Time 8   Period Weeks  Status Achieved  improved by 75%   PT LONG TERM GOAL #4   Title improvement of constipation >/= 60%   Time 8    Period Weeks   Status Not Met  improved by 50%               Plan - 15-Sep-2014 1141    Clinical Impression Statement Patient is being discharged due to feeling better and going to have pelvic surgery on 09/02/2014 to correct prolapse and rectocele.  Patient pelvic floor strength is 4/5 holding 10 seconds. Bilateral hip strength has increased. Constipation has improved by 50%. Pain with intercourse has decreased by 75%. Urinary leakage has improved to  60%.  Patient able to do single leg stance for 10 seconds due to improved strength. Patient has met LTG # 1,3.    Pt will benefit from skilled therapeutic intervention in order to improve on the following deficits Pain;Decreased strength;Decreased mobility;Increased muscle spasms;Decreased balance;Decreased activity tolerance;Decreased endurance   Rehab Potential Good   Clinical Impairments Affecting Rehab Potential none   PT Treatment/Interventions Therapeutic activities;Moist Heat;Biofeedback;Balance training;Manual techniques;Neuromuscular re-education;Stair training;Cryotherapy;Electrical Stimulation;Functional mobility training   PT Next Visit Plan Discharge to HEP    PT Home Exercise Plan current HEP   Consulted and Agree with Plan of Care Patient          G-Codes - September 15, 2014 1137    Functional Assessment Tool Used Therapist discretion due to difficulty with consitpation and urinary leakage 20% limitaiton   Functional Limitation Other PT primary   Other PT Primary Goal Status (O7078) At least 20 percent but less than 40 percent impaired, limited or restricted   Other PT Primary Discharge Status (M7544) At least 20 percent but less than 40 percent impaired, limited or restricted      Problem List Patient Active Problem List   Diagnosis Date Noted  . Breast cancer of upper-outer quadrant of left female breast 12/02/2013  . Complex regional pain syndrome I 12/02/2013  . Essential hypertension 09/05/2012  . Lower leg edema  09/05/2012  . Elevated C-reactive protein (CRP) 09/05/2012  . Dyslipidemia 09/05/2012  . History of radiation therapy     GRAY,CHERYL,PT 2014/09/15, 11:59 AM  [facility] [address] [place], [place], [address] Phone: [phone]   Fax:  [phone]  PHYSICAL THERAPY DISCHARGE SUMMARY  Visits from Start of Care: 7  Current functional level related to goals / functional outcomes: See above goals for update.  Patient is going to have surgery on 09/02/2014 to correct her prolapse, rectocele, and reattach the urethra to the bone.  Patient pain with intercourse has improved by 75%.  Urinary leakage has improved by 60% and constipation improved by 50%.    Remaining deficits: Pelvic floor strength is 4/5.    Education / Equipment: HEP Plan: Patient agrees to discharge.  Patient goals were partially met. Patient is being discharged due to a change in medical status. Thank you for the referral.  Earlie Counts, PT 09/15/14 11:59 AM ?????

## 2014-08-30 ENCOUNTER — Encounter: Payer: Medicare Other | Admitting: Physical Therapy

## 2014-09-06 ENCOUNTER — Encounter: Payer: Medicare Other | Admitting: Physical Therapy

## 2014-12-05 ENCOUNTER — Other Ambulatory Visit: Payer: Self-pay

## 2014-12-05 DIAGNOSIS — C50412 Malignant neoplasm of upper-outer quadrant of left female breast: Secondary | ICD-10-CM

## 2014-12-06 ENCOUNTER — Ambulatory Visit: Payer: Medicare Other | Admitting: Oncology

## 2014-12-06 ENCOUNTER — Other Ambulatory Visit: Payer: Medicare Other

## 2015-01-27 ENCOUNTER — Other Ambulatory Visit: Payer: Self-pay | Admitting: *Deleted

## 2015-01-27 DIAGNOSIS — C50412 Malignant neoplasm of upper-outer quadrant of left female breast: Secondary | ICD-10-CM

## 2015-01-27 MED ORDER — TAMOXIFEN CITRATE 20 MG PO TABS: 20.0000 mg | ORAL_TABLET | Freq: Every day | ORAL | Status: DC

## 2015-05-10 ENCOUNTER — Telehealth: Payer: Self-pay | Admitting: Adult Health

## 2015-05-10 NOTE — Telephone Encounter (Signed)
[name] called me with questions regarding the use of OTC Minoxidil (Rogain) for thinning hair/hair loss and is concerned that it may interact with her Tamoxifen.  I double checked the [place] which confirmed my assumption that these 2 medications should be safe when used together.  She also tells me that she is continuing to use low-dose Premarin vaginal estrogen for pelvic floor weakness s/p surgery and is feeling better as a result of those interventions.  I provided additional reassurance that the use of low-dose vaginal estrogen with Tamoxifen is completely safe.    She voiced appreciation for answering her questions and I encouraged her to call me again at any time in the future with any other concerns.    Mike Craze, NP [facility] [phone]

## 2015-08-16 ENCOUNTER — Encounter: Payer: Self-pay | Admitting: Adult Health

## 2015-08-16 NOTE — Progress Notes (Signed)
A birthday card was mailed to the patient today on behalf of the Survivorship Program at [facility].   [name], NP Survivorship Program [facility] [phone]  

## 2015-08-18 ENCOUNTER — Other Ambulatory Visit: Payer: Self-pay | Admitting: General Surgery

## 2015-08-18 DIAGNOSIS — Z853 Personal history of malignant neoplasm of breast: Secondary | ICD-10-CM

## 2015-10-04 ENCOUNTER — Ambulatory Visit
Admission: RE | Admit: 2015-10-04 | Discharge: 2015-10-04 | Disposition: A | Discharge status: Home / Self Care | Payer: Medicare Other | Source: Ambulatory Visit | Attending: General Surgery | Admitting: General Surgery

## 2015-10-04 DIAGNOSIS — Z853 Personal history of malignant neoplasm of breast: Secondary | ICD-10-CM

## 2015-10-12 ENCOUNTER — Other Ambulatory Visit: Payer: Self-pay | Admitting: Physician Assistant

## 2015-10-12 DIAGNOSIS — M8580 Other specified disorders of bone density and structure, unspecified site: Secondary | ICD-10-CM

## 2015-10-23 ENCOUNTER — Ambulatory Visit
Admission: RE | Admit: 2015-10-23 | Discharge: 2015-10-23 | Disposition: A | Discharge status: Home / Self Care | Payer: Medicare Other | Source: Ambulatory Visit | Attending: Physician Assistant | Admitting: Physician Assistant

## 2015-10-23 ENCOUNTER — Other Ambulatory Visit: Payer: Medicare Other

## 2015-10-23 DIAGNOSIS — M8580 Other specified disorders of bone density and structure, unspecified site: Secondary | ICD-10-CM

## 2015-12-06 ENCOUNTER — Telehealth: Payer: Self-pay | Admitting: Adult Health

## 2015-12-06 NOTE — Telephone Encounter (Signed)
Returned call to patient regarding setting up an appointment. Did not reach patient, no voicemail set to leave a message.

## 2016-02-21 ENCOUNTER — Other Ambulatory Visit: Payer: Self-pay | Admitting: *Deleted

## 2016-02-22 ENCOUNTER — Other Ambulatory Visit: Payer: Self-pay | Admitting: *Deleted

## 2016-02-22 DIAGNOSIS — C50412 Malignant neoplasm of upper-outer quadrant of left female breast: Secondary | ICD-10-CM

## 2016-02-22 DIAGNOSIS — Z170 Estrogen receptor positive status [ER+]: Principal | ICD-10-CM

## 2016-02-22 MED ORDER — TAMOXIFEN CITRATE 20 MG PO TABS: 20.0000 mg | ORAL_TABLET | Freq: Every day | ORAL | 0 refills | Status: DC

## 2016-02-28 ENCOUNTER — Telehealth: Payer: Self-pay | Admitting: Oncology

## 2016-02-28 NOTE — Telephone Encounter (Signed)
lvm to inform pt of Jan 2018 appt date/time per LOS

## 2016-03-19 ENCOUNTER — Other Ambulatory Visit: Payer: Self-pay | Admitting: *Deleted

## 2016-03-19 DIAGNOSIS — C50412 Malignant neoplasm of upper-outer quadrant of left female breast: Secondary | ICD-10-CM

## 2016-03-20 ENCOUNTER — Ambulatory Visit (HOSPITAL_BASED_OUTPATIENT_CLINIC_OR_DEPARTMENT_OTHER): Payer: PPO | Admitting: Oncology

## 2016-03-20 ENCOUNTER — Other Ambulatory Visit (HOSPITAL_BASED_OUTPATIENT_CLINIC_OR_DEPARTMENT_OTHER): Payer: PPO

## 2016-03-20 ENCOUNTER — Other Ambulatory Visit: Payer: Self-pay | Admitting: Oncology

## 2016-03-20 VITALS — BP 143/60 | HR 99 | Temp 97.7°F | Resp 18 | Ht 64.0 in | Wt 147.2 lb

## 2016-03-20 DIAGNOSIS — M8580 Other specified disorders of bone density and structure, unspecified site: Secondary | ICD-10-CM

## 2016-03-20 DIAGNOSIS — N951 Menopausal and female climacteric states: Secondary | ICD-10-CM | POA: Diagnosis not present

## 2016-03-20 DIAGNOSIS — Z170 Estrogen receptor positive status [ER+]: Secondary | ICD-10-CM

## 2016-03-20 DIAGNOSIS — C50412 Malignant neoplasm of upper-outer quadrant of left female breast: Secondary | ICD-10-CM | POA: Diagnosis not present

## 2016-03-20 DIAGNOSIS — R399 Unspecified symptoms and signs involving the genitourinary system: Secondary | ICD-10-CM

## 2016-03-20 DIAGNOSIS — Z79810 Long term (current) use of selective estrogen receptor modulators (SERMs): Secondary | ICD-10-CM

## 2016-03-20 LAB — COMPREHENSIVE METABOLIC PANEL
ALBUMIN: 3.8 g/dL (ref 3.5–5.0)
ALK PHOS: 76 U/L (ref 40–150)
ALT: 12 U/L (ref 0–55)
AST: 20 U/L (ref 5–34)
Anion Gap: 11 mEq/L (ref 3–11)
BUN: 13.6 mg/dL (ref 7.0–26.0)
CALCIUM: 9.6 mg/dL (ref 8.4–10.4)
CHLORIDE: 103 meq/L (ref 98–109)
CO2: 26 mEq/L (ref 22–29)
CREATININE: 0.8 mg/dL (ref 0.6–1.1)
EGFR: 73 mL/min/{1.73_m2} — ABNORMAL LOW (ref >90)
Glucose: 88 mg/dl (ref 70–140)
Potassium: 3.7 mEq/L (ref 3.5–5.1)
Sodium: 141 mEq/L (ref 136–145)
Total Bilirubin: 0.33 mg/dL (ref 0.20–1.20)
Total Protein: 6.9 g/dL (ref 6.4–8.3)

## 2016-03-20 LAB — CBC WITH DIFFERENTIAL/PLATELET
BASO%: 0.5 % (ref 0.0–2.0)
Basophils Absolute: 0 10*3/uL (ref 0.0–0.1)
EOS%: 6.8 % (ref 0.0–7.0)
Eosinophils Absolute: 0.4 10*3/uL (ref 0.0–0.5)
HEMATOCRIT: 38.1 % (ref 34.8–46.6)
HGB: 12.7 g/dL (ref 11.6–15.9)
LYMPH#: 2 10*3/uL (ref 0.9–3.3)
LYMPH%: 33.1 % (ref 14.0–49.7)
MCH: 31.8 pg (ref 25.1–34.0)
MCHC: 33.3 g/dL (ref 31.5–36.0)
MCV: 95.5 fL (ref 79.5–101.0)
MONO#: 0.5 10*3/uL (ref 0.1–0.9)
MONO%: 7.8 % (ref 0.0–14.0)
NEUT#: 3.1 10*3/uL (ref 1.5–6.5)
NEUT%: 51.8 % (ref 38.4–76.8)
Platelets: 311 10*3/uL (ref 145–400)
RBC: 3.99 10*6/uL (ref 3.70–5.45)
RDW: 12.9 % (ref 11.2–14.5)
WBC: 6 10*3/uL (ref 3.9–10.3)

## 2016-03-20 NOTE — Progress Notes (Signed)
[name]  [phone]) [phone] [phone]     ID: [name] DOB: [birth year]  MR#: [id number]  KLK#:[id number]  Patient Care Team: Catherine Shelter, PA-C as PCP - General (Physician Assistant) Catherine Cruel, MD as Consulting Physician (Oncology) Catherine Messing III, MD as Consulting Physician (General Surgery) Catherine Bouche, NP as Nurse Practitioner (Nurse Practitioner) [name] (Obstetrics and Gynecology) PCP: Catherine Shelter, PA-C GYN: [name] SU: [name] OTHER MD: [name]  CHIEF COMPLAINT: Microinvasive breast cancer, ductal carcinoma in [place]  CURRENT TREATMENT: Tamoxifen   BREAST CANCER HISTORY: From doctor [name] original intake note, 10/02/2011:   "[name] is a 71 y.o. female. With medical history significant for IBS arthritis ocular disease and gastroesophageal reflux disease. Patient recently presented for an annual screening mammogram on 09/10/2011. On this mammogram patient was found to have a new small cluster of pleomorphic microcalcifications located within the upper outer quadrant of the left breast measuring 3 mm. Because of this biopsy was recommended. Patient went on to have a core needle biopsy performed. The pathology showed invasive ductal carcinoma with mucinous features atypical ductal hyperplasia with calcifications and fibrocystic changes with calcifications. Prognostic markers could not be obtained to 2 small sample size. Patient went on to have MRI of the breasts performed on 09/30/2011. The MRI showed foci of nonspecific enhancement bilaterally there was about biopsy clip and a 1.4 cm postbiopsy hematoma in the upper outer quadrant of the left breast posteriorly no significant residual enhancement was seen in the upper outer quadrant of the left breast no suspicious mass or enhancement in either breast no axillary or internal mammary adenopathy.Patient has been on hormone replacement therapy and  it was recommended today that she continue this as well.  Patient is now seen in the multidisciplinary breast clinic for discussion of treatment options. Her case was presented at the multidisciplinary breast conference all of her pathology and images were reviewed at the conference and add pre-clinic conference."  Her subsequent history is detailed below  INTERVAL HISTORY: [name] returns today for followup of her microinvasive breast cancer. She continues on tamoxifen, and "I can't wait until the day where I will be off that drug." The main problem she is having with that is hot flashes. These are better at night, and she is taking gabapentin 300 mg at bedtime. She is also on fairly low dose venlafaxine for the daytime hot flashes. Aside from this she tolerates the drug well and obtains it at a good price.  REVIEW OF SYSTEMS: She underwent pelvic surgery this past year with great difficulty and only recently has begun to feel a little bit more normal area she is using Estrace cream 2 times a week vaginally and she feels this is helpful. She is very anxious about using it however and wanted to discuss that in more detail today. She has back and joint pain here and there which is at baseline. Sometimes she has ankle swelling and she tells me he just completed a course of steroids to help with the joint swelling problem. She has seasonal allergies. Aside from these issues a detailed review of systems today was stable  PAST MEDICAL HISTORY: Past Medical History:  Diagnosis Date  . Abnormal C-reactive protein   . Anxiety   . Arthritis   . Breast cancer 10/18/2011   dcis, left breast, ER/PR +  . Breast cancer, stage 1    left  . Depression   . Dysrhythmia  palpitations  . GERD (gastroesophageal reflux disease)   . H/O bone density study   . H/O colonoscopy   . Heartburn   . History of radiation therapy 12/24/11 -01/21/12   left breast  . Hot flashes   . Hyperlipidemia   . Hypertension   .  Hypertension   . IBS (irritable bowel syndrome)   . Night sweats   . Psoriasis   . Sinus complaint     PAST SURGICAL HISTORY: Past Surgical History:  Procedure Laterality Date  . ABDOMINAL HYSTERECTOMY    . BREAST SURGERY  10/18/2011   LT br lumpectomy  . CATARACT EXTRACTION    . COLONOSCOPY    . DILATION AND CURETTAGE OF UTERUS    . EYE SURGERY    . HAND SURGERY     RSD  . NM MYOVIEW LTD  10/19/2008   normal  . TONSILLECTOMY AND ADENOIDECTOMY    . UPPER GASTROINTESTINAL ENDOSCOPY      FAMILY HISTORY Family History  Problem Relation Age of Onset  . Cancer Paternal Grandmother     "questionable breast cancer"  . Cancer Cousin     maternal; colon ca  . Heart attack Father   . Hyperlipidemia Mother   . Heart attack Brother   . Hypertension Brother   . Hyperlipidemia Brother    the patient's father died at the age of 74 from myocardial infarction. The patient's mother died at age 69 from heart disease. The patient has 3 brothers, 2 sisters. There is no history of breast or ovarian cancer in the family.  GYNECOLOGIC HISTORY:  No LMP recorded. Patient has had a hysterectomy. Menarche age 44, first live birth age 64. The patient is GX P2. She underwent hysterectomy with unilateral salpingo-oophorectomy at age 76. She took hormone replacement "for many years".  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 Her husband "[name]" restores old cars. Daughter [name] works as a Dealer and Dr. Mateo Cameron works as a Educational psychologist in [place]. The patient has 5 grandchildren. She attends a Warehouse manager., [employer].    ADVANCED DIRECTIVES: Not in place  HEALTH MAINTENANCE: Social History  Substance Use Topics  . Smoking status: Former Smoker    Packs/day: 1.00    Types: Cigarettes    Quit date: 10/14/1981  . Smokeless tobacco: Never Used  . Alcohol use 4.2 oz/week    7 Glasses of wine per week     Colonoscopy:  PAP:  Bone  density:  Lipid panel:  Allergies  Allergen Reactions  . Statins   . Tape Hives    Current Outpatient Prescriptions  Medication Sig Dispense Refill  . ALPRAZolam (XANAX) 0.25 MG tablet Take 0.25 mg by mouth at bedtime as needed for sleep.    Marland Kitchen BIOTIN PO Take 550 mg by mouth.    Marland Kitchen CALCIUM PO Take by mouth.    . diclofenac sodium (VOLTAREN) 1 % GEL Apply 1 application topically.    . gabapentin (NEURONTIN) 300 MG capsule Take 1 capsule (300 mg total) by mouth at bedtime. 30 capsule 3  . ibuprofen (ADVIL,MOTRIN) 200 MG tablet Take 200 mg by mouth every 6 (six) hours as needed.    Marland Kitchen lisinopril-hydrochlorothiazide (ZESTORETIC) 10-12.5 MG per tablet Take 1 tablet by mouth daily. 90 tablet 2  . MAGNESIUM PO Take by mouth.    . metroNIDAZOLE (METROGEL) 0.75 % gel Apply 1 application topically daily.    . Multiple Vitamin (MULTIVITAMIN) tablet Take 1  tablet by mouth daily.    . nortriptyline (PAMELOR) 25 MG capsule Take 50 mg by mouth.     . Omega-3 Fatty Acids (FISH OIL PO) Take 1 each by mouth.    Marland Kitchen omeprazole (PRILOSEC) 20 MG capsule Take 20 mg by mouth daily.    . predniSONE (STERAPRED UNI-PAK) 10 MG tablet     . RESTASIS 0.05 % ophthalmic emulsion Apply 2 drops to eye daily.    . tamoxifen (NOLVADEX) 20 MG tablet Take 1 tablet (20 mg total) by mouth daily. 90 tablet 0  . tretinoin (RETIN-A) 0.05 % cream Apply topically at bedtime.    Marland Kitchen venlafaxine XR (EFFEXOR-XR) 75 MG 24 hr capsule Take 1 capsule (75 mg total) by mouth daily. 30 capsule 3  . VITAMIN E PO Take by mouth.     No current facility-administered medications for this visit.     OBJECTIVE: Middle-aged white woman In no acute distress  Vitals:   03/20/16 1502 03/20/16 1505  BP: (!) 152/84 (!) 143/60  Pulse: 84 99  Resp: 18 18  Temp: 98.1 F (36.7 C) 97.7 F (36.5 C)     Body mass index is 25.27 kg/m.    ECOG FS:1 - Symptomatic but completely ambulatory  Sclerae unicteric, pupils round and equal Oropharynx clear and  moist-- no thrush or other lesions No cervical or supraclavicular adenopathy Lungs no rales or rhonchi Heart regular rate and rhythm Abd soft, nontender, positive bowel sounds MSK no focal spinal tenderness, no upper extremity lymphedema Neuro: nonfocal, well oriented, appropriate affect Breasts: The right breast is unremarkable. The left breast is status post lumpectomy and radiation. There is no evidence of local recurrence. The left axilla is benign.  LAB RESULTS:  CMP     Component Value Date/Time   NA 141 03/20/2016 1443   K 3.7 03/20/2016 1443   CL 104 12/02/2013 1343   CL 103 06/08/2012 1407   CO2 26 03/20/2016 1443   GLUCOSE 88 03/20/2016 1443   GLUCOSE 99 06/08/2012 1407   BUN 13.6 03/20/2016 1443   CREATININE 0.8 03/20/2016 1443   CALCIUM 9.6 03/20/2016 1443   PROT 6.9 03/20/2016 1443   ALBUMIN 3.8 03/20/2016 1443   AST 20 03/20/2016 1443   ALT 12 03/20/2016 1443   ALKPHOS 76 03/20/2016 1443   BILITOT 0.33 03/20/2016 1443   GFRNONAA 88 (L) 10/17/2011 1502   GFRAA >90 10/17/2011 1502    I No results found for: SPEP  Lab Results  Component Value Date   WBC 6.0 03/20/2016   NEUTROABS 3.1 03/20/2016   HGB 12.7 03/20/2016   HCT 38.1 03/20/2016   MCV 95.5 03/20/2016   PLT 311 03/20/2016      Chemistry      Component Value Date/Time   NA 141 03/20/2016 1443   K 3.7 03/20/2016 1443   CL 104 12/02/2013 1343   CL 103 06/08/2012 1407   CO2 26 03/20/2016 1443   BUN 13.6 03/20/2016 1443   CREATININE 0.8 03/20/2016 1443      Component Value Date/Time   CALCIUM 9.6 03/20/2016 1443   ALKPHOS 76 03/20/2016 1443   AST 20 03/20/2016 1443   ALT 12 03/20/2016 1443   BILITOT 0.33 03/20/2016 1443       Lab Results  Component Value Date   LABCA2 8 10/02/2011    No components found for: [id number]  No results for input(s): INR in the last 168 hours.  Urinalysis No results found for: COLORURINE  STUDIES:  EXAM: DUAL X-RAY ABSORPTIOMETRY (DXA) FOR BONE  MINERAL DENSITY  IMPRESSION: Referring Physician:  MARK HEPLER  PATIENT: Name: [name], [name] Patient ID: [id number] Birth Date: 1946-03-04 Height: 64.3 in. Sex: Female Measured: 10/23/2015 Weight: 142.0 lbs. Indications: Breast Cancer History, Caucasian, Estrogen Deficient, Hysterectomy, Left Ovariectomy, Omeprazole, Postmenopausal Fractures: Finger Treatments: Calcium (E943.0), Vitamin D (E933.5)  ASSESSMENT: The BMD measured at Femur Neck Right is 0.782 g/cm2 with a T-score of -1.8. This patient is considered osteopenic according to [facility]) criteria. There has been no statistically significant change in BMD of Lumbar spine or left hip since prior exam dated 01/20/2013.  Site Region Measured Date Measured Age [age]A BMD Significant CHANGE T-score DualFemur Neck Right 10[age]23/2015    70.1         -1.10    0.782 g/cm2  AP Spine  L1-L4      12/23/[age]    68.1         -0.10    1.093 g/cm2  World Health Organization [facility]) criteria for post-menopausal, Caucasian Women: Normal       T-score at or above -1 SD Osteopenia   T-score between -1 and -2.5 SD Osteoporosis T-score at or below -2.5 SD   CLINICAL DATA:  Malignant lumpectomy of the upper outer left breast in 2013 with adjuvant radiation therapy. Annual evaluation.  EXAM: 2D DIGITAL DIAGNOSTIC BILATERAL MAMMOGRAM WITH CAD AND ADJUNCT TOMO  COMPARISON:  Previous exam(s).  ACR Breast Density Category c: The breast tissue is heterogeneously dense, which may obscure small masses.  FINDINGS: Standard 2D and tomosynthesis full field CC and MLO views of both breasts were obtained. A spot magnification tangential view of the lumpectomy site in the left breast was also obtained. Scarring at the lumpectomy site in the upper outer left breast with further retraction of the scar since the prior mammogram 1 year ago. No new or suspicious findings in the left breast.  No findings suspicious for  malignancy in the right breast.  Mammographic images were processed with CAD.  IMPRESSION: No mammographic evidence of malignancy. Expected post lumpectomy changes in the left breast.  RECOMMENDATION: Bilateral diagnostic mammography in 1 year.  I have discussed the findings and recommendations with the patient. Results were also provided in writing at the conclusion of the visit. If applicable, a reminder letter will be sent to the patient regarding the next appointment.  BI-RADS CATEGORY  2: Benign.   Electronically Signed   By: Evangeline Dakin M.D.   On: 10/04/2015 12:14   ASSESSMENT: 71 y.o. [place] woman status post left upper outer quadrant biopsy 09/24/2011 for a microinvasive ductal carcinoma with mucinous features, grade 1, HER-2 negative, with insufficient carcinoma present for estrogen or progesterone receptor analysis ([id number])  (1) status post left lumpectomy and sentinel lymph node sampling 10/18/2011 showing no residual invasive carcinoma; there was a low-grade ductal carcinoma in Cameron measuring 1.2 cm, estrogen and progesterone receptor positive, with negative margins.Ashok Croon [id number])  (2) completed radiation therapy to the left breast 01/21/2012  (3) started tamoxifen December 2013.  (4) osteopenia with a T score of -2.0 on DEXA scan 01/20/2013  PLAN: [name] is now a little over 4 years out from definitive surgery for her microinvasive breast cancer, with no evidence of disease recurrence. This is very favorable  She continues on tamoxifen and her goal is to take this medication chiefly for prophylaxis, to a total of 5 years. She is already on gabapentin at bedtime and venlafaxine in the morning, but  both of these are at low dose and she still having significant problems with hot flashes. Accordingly today I suggested she increase her gabapentin to 600 mg at bedtime and the venlafaxine 250 mg daily.  If she tolerates these doses well, and if she  experiences benefit from these changes, she will let us know and I will put in the appropriate dosage prescriptions in for her.  We discussed her bone density, which is slightly improved, likely secondary to the tamoxifen she has been taking.  She is very bothered by stress urinary incontinence symptoms. We do have a very experienced urologist locally that she was interested in consulting with and I have gone ahead and placed the consult with Dr. McDiarmid for her.  We spent approximately 20 minutes of the entire 35 minute visit today discussing estrogen issues. She is currently using vaginal estrogen supplementation. Until recently we were able to say that this probably did not increase the risk of breast cancer, but we now have data from [place] published within the last month in the [place] that actually measures the increased risk for breast cancer in patients without a history of breast cancer would do use vaginal estrogen preparations or for that matter low estrogen birth control pills.  The risk is measurable but small. It may be outweighed in her case by quality of life issues. In addition, while she is on tamoxifen, I am very comfortable with her using vaginal estrogen preparations since we have data from population studies that this is safe so long as she is on tamoxifen.  Obviously the question is going to be what is she going to do when she goes off tamoxifen as she is scheduled to do after another year. She will be thinking about this before then.  She knows to call for any other problems that may develop before her return visit here.  Catherine Cruel, MD   03/20/2016 5:04 PM

## 2016-04-25 ENCOUNTER — Other Ambulatory Visit: Payer: Self-pay | Admitting: *Deleted

## 2016-04-25 DIAGNOSIS — R232 Flushing: Secondary | ICD-10-CM

## 2016-04-25 MED ORDER — VENLAFAXINE HCL ER 75 MG PO CP24: 150.0000 mg | ORAL_CAPSULE | Freq: Every day | ORAL | 3 refills | Status: DC

## 2016-05-30 ENCOUNTER — Other Ambulatory Visit: Payer: Self-pay

## 2016-05-30 DIAGNOSIS — Z170 Estrogen receptor positive status [ER+]: Principal | ICD-10-CM

## 2016-05-30 DIAGNOSIS — C50412 Malignant neoplasm of upper-outer quadrant of left female breast: Secondary | ICD-10-CM

## 2016-05-30 MED ORDER — TAMOXIFEN CITRATE 20 MG PO TABS: 20.0000 mg | ORAL_TABLET | Freq: Every day | ORAL | 0 refills | Status: DC

## 2016-05-31 ENCOUNTER — Other Ambulatory Visit: Payer: Self-pay | Admitting: *Deleted

## 2016-05-31 DIAGNOSIS — C50412 Malignant neoplasm of upper-outer quadrant of left female breast: Secondary | ICD-10-CM

## 2016-05-31 DIAGNOSIS — Z170 Estrogen receptor positive status [ER+]: Principal | ICD-10-CM

## 2016-05-31 MED ORDER — TAMOXIFEN CITRATE 20 MG PO TABS: 20.0000 mg | ORAL_TABLET | Freq: Every day | ORAL | 3 refills | Status: DC

## 2016-07-01 DIAGNOSIS — M545 Low back pain: Secondary | ICD-10-CM | POA: Diagnosis not present

## 2016-07-02 ENCOUNTER — Other Ambulatory Visit: Payer: Self-pay | Admitting: *Deleted

## 2016-07-02 DIAGNOSIS — R232 Flushing: Secondary | ICD-10-CM

## 2016-07-02 DIAGNOSIS — D1801 Hemangioma of skin and subcutaneous tissue: Secondary | ICD-10-CM | POA: Diagnosis not present

## 2016-07-02 DIAGNOSIS — L84 Corns and callosities: Secondary | ICD-10-CM | POA: Diagnosis not present

## 2016-07-02 DIAGNOSIS — L719 Rosacea, unspecified: Secondary | ICD-10-CM | POA: Diagnosis not present

## 2016-07-02 DIAGNOSIS — L650 Telogen effluvium: Secondary | ICD-10-CM | POA: Diagnosis not present

## 2016-07-02 DIAGNOSIS — L821 Other seborrheic keratosis: Secondary | ICD-10-CM | POA: Diagnosis not present

## 2016-07-02 DIAGNOSIS — D225 Melanocytic nevi of trunk: Secondary | ICD-10-CM | POA: Diagnosis not present

## 2016-07-02 MED ORDER — VENLAFAXINE HCL ER 75 MG PO CP24: 150.0000 mg | ORAL_CAPSULE | Freq: Every day | ORAL | 3 refills | Status: DC

## 2016-07-15 DIAGNOSIS — M545 Low back pain: Secondary | ICD-10-CM | POA: Diagnosis not present

## 2016-07-15 DIAGNOSIS — M542 Cervicalgia: Secondary | ICD-10-CM | POA: Diagnosis not present

## 2016-07-15 DIAGNOSIS — G4700 Insomnia, unspecified: Secondary | ICD-10-CM | POA: Diagnosis not present

## 2016-08-16 DIAGNOSIS — Z961 Presence of intraocular lens: Secondary | ICD-10-CM | POA: Diagnosis not present

## 2016-08-16 DIAGNOSIS — Z9841 Cataract extraction status, right eye: Secondary | ICD-10-CM | POA: Diagnosis not present

## 2016-08-16 DIAGNOSIS — I10 Essential (primary) hypertension: Secondary | ICD-10-CM | POA: Diagnosis not present

## 2016-08-16 DIAGNOSIS — Z9842 Cataract extraction status, left eye: Secondary | ICD-10-CM | POA: Diagnosis not present

## 2016-08-16 DIAGNOSIS — Z79899 Other long term (current) drug therapy: Secondary | ICD-10-CM | POA: Diagnosis not present

## 2016-08-16 DIAGNOSIS — Z9889 Other specified postprocedural states: Secondary | ICD-10-CM | POA: Diagnosis not present

## 2016-08-16 DIAGNOSIS — H04123 Dry eye syndrome of bilateral lacrimal glands: Secondary | ICD-10-CM | POA: Diagnosis not present

## 2016-08-16 DIAGNOSIS — Z888 Allergy status to other drugs, medicaments and biological substances status: Secondary | ICD-10-CM | POA: Diagnosis not present

## 2016-08-16 DIAGNOSIS — H35343 Macular cyst, hole, or pseudohole, bilateral: Secondary | ICD-10-CM | POA: Diagnosis not present

## 2016-09-24 DIAGNOSIS — H35341 Macular cyst, hole, or pseudohole, right eye: Secondary | ICD-10-CM | POA: Diagnosis not present

## 2016-09-24 DIAGNOSIS — H35371 Puckering of macula, right eye: Secondary | ICD-10-CM | POA: Diagnosis not present

## 2016-10-17 DIAGNOSIS — H35341 Macular cyst, hole, or pseudohole, right eye: Secondary | ICD-10-CM | POA: Diagnosis not present

## 2016-10-18 DIAGNOSIS — H35371 Puckering of macula, right eye: Secondary | ICD-10-CM | POA: Diagnosis not present

## 2016-10-18 DIAGNOSIS — H35341 Macular cyst, hole, or pseudohole, right eye: Secondary | ICD-10-CM | POA: Diagnosis not present

## 2016-10-29 ENCOUNTER — Other Ambulatory Visit: Payer: Self-pay | Admitting: Oncology

## 2016-10-29 DIAGNOSIS — R232 Flushing: Secondary | ICD-10-CM

## 2016-10-29 DIAGNOSIS — H35371 Puckering of macula, right eye: Secondary | ICD-10-CM | POA: Diagnosis not present

## 2016-10-29 DIAGNOSIS — H35341 Macular cyst, hole, or pseudohole, right eye: Secondary | ICD-10-CM | POA: Diagnosis not present

## 2016-11-07 ENCOUNTER — Other Ambulatory Visit: Payer: Self-pay | Admitting: General Surgery

## 2016-11-07 DIAGNOSIS — Z853 Personal history of malignant neoplasm of breast: Secondary | ICD-10-CM

## 2016-11-20 ENCOUNTER — Ambulatory Visit
Admission: RE | Admit: 2016-11-20 | Discharge: 2016-11-20 | Disposition: A | Discharge status: Home / Self Care | Payer: PPO | Source: Ambulatory Visit | Attending: General Surgery | Admitting: General Surgery

## 2016-11-20 DIAGNOSIS — Z853 Personal history of malignant neoplasm of breast: Secondary | ICD-10-CM

## 2016-11-20 DIAGNOSIS — R928 Other abnormal and inconclusive findings on diagnostic imaging of breast: Secondary | ICD-10-CM | POA: Diagnosis not present

## 2016-11-20 DIAGNOSIS — H35371 Puckering of macula, right eye: Secondary | ICD-10-CM | POA: Diagnosis not present

## 2016-11-20 DIAGNOSIS — H35341 Macular cyst, hole, or pseudohole, right eye: Secondary | ICD-10-CM | POA: Diagnosis not present

## 2016-11-21 DIAGNOSIS — Z961 Presence of intraocular lens: Secondary | ICD-10-CM | POA: Diagnosis not present

## 2016-12-11 DIAGNOSIS — T1511XA Foreign body in conjunctival sac, right eye, initial encounter: Secondary | ICD-10-CM | POA: Diagnosis not present

## 2016-12-11 DIAGNOSIS — H5711 Ocular pain, right eye: Secondary | ICD-10-CM | POA: Diagnosis not present

## 2016-12-24 DIAGNOSIS — H35341 Macular cyst, hole, or pseudohole, right eye: Secondary | ICD-10-CM | POA: Diagnosis not present

## 2017-01-01 DIAGNOSIS — I10 Essential (primary) hypertension: Secondary | ICD-10-CM | POA: Diagnosis not present

## 2017-01-01 DIAGNOSIS — Z0000 Encounter for general adult medical examination without abnormal findings: Secondary | ICD-10-CM | POA: Diagnosis not present

## 2017-01-01 DIAGNOSIS — Z23 Encounter for immunization: Secondary | ICD-10-CM | POA: Diagnosis not present

## 2017-01-01 DIAGNOSIS — N3946 Mixed incontinence: Secondary | ICD-10-CM | POA: Diagnosis not present

## 2017-01-01 DIAGNOSIS — E782 Mixed hyperlipidemia: Secondary | ICD-10-CM | POA: Diagnosis not present

## 2017-01-01 DIAGNOSIS — K219 Gastro-esophageal reflux disease without esophagitis: Secondary | ICD-10-CM | POA: Diagnosis not present

## 2017-01-01 DIAGNOSIS — C50919 Malignant neoplasm of unspecified site of unspecified female breast: Secondary | ICD-10-CM | POA: Diagnosis not present

## 2017-02-04 DIAGNOSIS — Z853 Personal history of malignant neoplasm of breast: Secondary | ICD-10-CM | POA: Diagnosis not present

## 2017-02-04 DIAGNOSIS — R05 Cough: Secondary | ICD-10-CM | POA: Diagnosis not present

## 2017-02-04 DIAGNOSIS — J029 Acute pharyngitis, unspecified: Secondary | ICD-10-CM | POA: Diagnosis not present

## 2017-02-04 DIAGNOSIS — J069 Acute upper respiratory infection, unspecified: Secondary | ICD-10-CM | POA: Diagnosis not present

## 2017-02-11 ENCOUNTER — Ambulatory Visit: Payer: BC Managed Care – PPO | Admitting: Oncology

## 2017-02-11 ENCOUNTER — Other Ambulatory Visit: Payer: BC Managed Care – PPO

## 2017-04-08 ENCOUNTER — Telehealth: Payer: Self-pay | Admitting: Oncology

## 2017-04-08 NOTE — Telephone Encounter (Signed)
Tried to call patient it went straight to voicemail

## 2017-04-09 NOTE — Progress Notes (Signed)
[facility]  Telephone:([phone]) [phone] Fax:(336) [phone]     ID: DAGMAR ADCOX DOB: [birth year]  MR#: [id number]  LPF#:[id number]  Patient Care Team: Corine Shelter, PA-C as PCP - General (Physician Assistant) Magrinat, Virgie Dad, MD as Consulting Physician (Oncology) Jovita Kussmaul, MD as Consulting Physician (General Surgery) Holley Bouche, NP as Nurse Practitioner (Nurse Practitioner) Rockey Situ (Obstetrics and Gynecology) PCP: Corine Shelter, PA-C GYN: Paula Compton SU: Lorine Bears OTHER MD: Lance Morin  CHIEF COMPLAINT: Estrogen receptor positive breast cancer, ductal carcinoma in situ  CURRENT TREATMENT: completing 5 years of Tamoxifen   BREAST CANCER HISTORY: From doctor Dana Allan original intake note, 10/02/2011:   "[name] is a 72 y.o. female. With medical history significant for IBS arthritis ocular disease and gastroesophageal reflux disease. Patient recently presented for an annual screening mammogram on 09/10/2011. On this mammogram patient was found to have a new small cluster of pleomorphic microcalcifications located within the upper outer quadrant of the left breast measuring 3 mm. Because of this biopsy was recommended. Patient went on to have a core needle biopsy performed. The pathology showed invasive ductal carcinoma with mucinous features atypical ductal hyperplasia with calcifications and fibrocystic changes with calcifications. Prognostic markers could not be obtained to 2 small sample size. Patient went on to have MRI of the breasts performed on 09/30/2011. The MRI showed foci of nonspecific enhancement bilaterally there was about biopsy clip and a 1.4 cm postbiopsy hematoma in the upper outer quadrant of the left breast posteriorly no significant residual enhancement was seen in the upper outer quadrant of the left breast no suspicious mass or enhancement in either breast no axillary or internal mammary adenopathy.Patient  has been on hormone replacement therapy and it was recommended today that she continue this as well.  Patient is now seen in the multidisciplinary breast clinic for discussion of treatment options. Her case was presented at the multidisciplinary breast conference all of her pathology and images were reviewed at the conference and add pre-clinic conference."  Her subsequent history is detailed below  INTERVAL HISTORY: [name] returns today for followup of her microinvasive, estrogen receptor positive breast cancer. She continues on tamoxifen, with good tolerance. She had some hot flashes, and vaginal wetness in not an issue. She notes that she would like to continue taking gabapentin because it helps her with hand neuropathy.    REVIEW OF SYSTEMS: [name] reports that she is doing well. She notes that the holidays went well for her, with only a little stress. She is excited that she will not have to take tamoxifen anymore. She notes that she went to her PCP because she as concerned about her bone health. She notes that she was prescribed a new medication for this. She denies unusual headaches, visual changes, nausea, vomiting, or dizziness. There has been no unusual cough, phlegm production, or pleurisy. This been no change in bowel or bladder habits. She denies unexplained fatigue or unexplained weight loss, bleeding, rash, or fever. A detailed review of systems was otherwise stable.    PAST MEDICAL HISTORY: Past Medical History:  Diagnosis Date  . Abnormal C-reactive protein   . Anxiety   . Arthritis   . Breast cancer 10/18/2011   dcis, left breast, ER/PR +  . Breast cancer, stage 1    left  . Depression   . Dysrhythmia    palpitations  . GERD (gastroesophageal reflux disease)   . H/O bone density study   . H/O colonoscopy   .  Heartburn   . History of radiation therapy 12/24/11 -01/21/12   left breast  . Hot flashes   . Hyperlipidemia   . Hypertension   . Hypertension   . IBS (irritable  bowel syndrome)   . Night sweats   . Psoriasis   . Sinus complaint     PAST SURGICAL HISTORY: Past Surgical History:  Procedure Laterality Date  . ABDOMINAL HYSTERECTOMY    . BREAST SURGERY  10/18/2011   LT br lumpectomy  . CATARACT EXTRACTION    . COLONOSCOPY    . DILATION AND CURETTAGE OF UTERUS    . EYE SURGERY    . HAND SURGERY     RSD  . NM MYOVIEW LTD  10/19/2008   normal  . TONSILLECTOMY AND ADENOIDECTOMY    . UPPER GASTROINTESTINAL ENDOSCOPY      FAMILY HISTORY Family History  Problem Relation Age of Onset  . Cancer Paternal Grandmother        "questionable breast cancer"  . Cancer Cousin        maternal; colon ca  . Heart attack Father   . Hyperlipidemia Mother   . Heart attack Brother   . Hypertension Brother   . Hyperlipidemia Brother    the patient's father died at the age of 4 from myocardial infarction. The patient's mother died at age 57 from heart disease. The patient has 3 brothers, 2 sisters. There is no history of breast or ovarian cancer in the family.  GYNECOLOGIC HISTORY:  No LMP recorded. Patient has had a hysterectomy. Menarche age 22, first live birth age 54. The patient is GX P2. She underwent hysterectomy with unilateral salpingo-oophorectomy at age 74. She took hormone replacement "for many years".  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 Her husband "Rush Landmark" restores old cars. Daughter [name] works as a Dealer and Dr. Mateo Flow works as a Educational psychologist in [place]. The patient has 5 grandchildren. She attends a Warehouse manager., [employer].    ADVANCED DIRECTIVES: Not in place  HEALTH MAINTENANCE: Social History   Tobacco Use  . Smoking status: Former Smoker    Packs/day: 1.00    Types: Cigarettes    Last attempt to quit: 10/14/1981    Years since quitting: 35.5  . Smokeless tobacco: Never Used  Substance Use Topics  . Alcohol use: Yes    Alcohol/week: 4.2 oz    Types: 7 Glasses of wine  per week  . Drug use: No     Colonoscopy:  PAP:  Bone density:  Lipid panel:  Allergies  Allergen Reactions  . Statins   . Tape Hives    Current Outpatient Medications  Medication Sig Dispense Refill  . ALPRAZolam (XANAX) 0.25 MG tablet Take 0.25 mg by mouth at bedtime as needed for sleep.    Marland Kitchen BIOTIN PO Take 550 mg by mouth.    Marland Kitchen CALCIUM PO Take by mouth.    . diclofenac sodium (VOLTAREN) 1 % GEL Apply 1 application topically.    . gabapentin (NEURONTIN) 300 MG capsule Take 1 capsule (300 mg total) by mouth at bedtime. 30 capsule 3  . ibuprofen (ADVIL,MOTRIN) 200 MG tablet Take 200 mg by mouth every 6 (six) hours as needed.    Marland Kitchen lisinopril-hydrochlorothiazide (ZESTORETIC) 10-12.5 MG per tablet Take 1 tablet by mouth daily. 90 tablet 2  . MAGNESIUM PO Take by mouth.    . metroNIDAZOLE (METROGEL) 0.75 % gel Apply 1 application topically daily.    Marland Kitchen  Multiple Vitamin (MULTIVITAMIN) tablet Take 1 tablet by mouth daily.    . nortriptyline (PAMELOR) 25 MG capsule Take 50 mg by mouth.     . Omega-3 Fatty Acids (FISH OIL PO) Take 1 each by mouth.    Marland Kitchen omeprazole (PRILOSEC) 20 MG capsule Take 20 mg by mouth daily.    . predniSONE (STERAPRED UNI-PAK) 10 MG tablet     . RESTASIS 0.05 % ophthalmic emulsion Apply 2 drops to eye daily.    . tamoxifen (NOLVADEX) 20 MG tablet Take 1 tablet (20 mg total) by mouth daily. 90 tablet 3  . tretinoin (RETIN-A) 0.05 % cream Apply topically at bedtime.    Marland Kitchen venlafaxine XR (EFFEXOR-XR) 150 MG 24 hr capsule TAKE 1 CAPSULE (150MG TOTAL) BY MOUTH EVERY DAY 30 capsule 6  . VITAMIN E PO Take by mouth.     No current facility-administered medications for this visit.     OBJECTIVE: Middle-aged white woman who appears well  Vitals:   04/10/17 1131  BP: (!) 148/65  Pulse: 78  Resp: 20  Temp: 98.2 F (36.8 C)  SpO2: 98%     Body mass index is 25.35 kg/m.    ECOG FS:0 - Asymptomatic  Sclerae unicteric, EOMs intact Oropharynx clear and moist No  cervical or supraclavicular adenopathy Lungs no rales or rhonchi Heart regular rate and rhythm Abd soft, nontender, positive bowel sounds MSK no focal spinal tenderness, no upper extremity lymphedema Neuro: nonfocal, well oriented, appropriate affect Breasts: The right breast is benign.  The left breast has undergone lumpectomy and radiation, with no evidence of local recurrence.  Both axillae are benign.   LAB RESULTS:  CMP     Component Value Date/Time   NA 139 04/10/2017 1008   NA 141 03/20/2016 1443   K 3.9 04/10/2017 1008   K 3.7 03/20/2016 1443   CL 103 04/10/2017 1008   CL 103 06/08/2012 1407   CO2 28 04/10/2017 1008   CO2 26 03/20/2016 1443   GLUCOSE 94 04/10/2017 1008   GLUCOSE 88 03/20/2016 1443   GLUCOSE 99 06/08/2012 1407   BUN 17 04/10/2017 1008   BUN 13.6 03/20/2016 1443   CREATININE 0.84 04/10/2017 1008   CREATININE 0.8 03/20/2016 1443   CALCIUM 8.7 04/10/2017 1008   CALCIUM 9.6 03/20/2016 1443   PROT 6.2 (L) 04/10/2017 1008   PROT 6.9 03/20/2016 1443   ALBUMIN 3.5 04/10/2017 1008   ALBUMIN 3.8 03/20/2016 1443   AST 20 04/10/2017 1008   AST 20 03/20/2016 1443   ALT 15 04/10/2017 1008   ALT 12 03/20/2016 1443   ALKPHOS 53 04/10/2017 1008   ALKPHOS 76 03/20/2016 1443   BILITOT 0.3 04/10/2017 1008   BILITOT 0.33 03/20/2016 1443   GFRNONAA >60 04/10/2017 1008   GFRAA >60 04/10/2017 1008    I No results found for: SPEP  Lab Results  Component Value Date   WBC 6.2 04/10/2017   NEUTROABS 3.5 04/10/2017   HGB 12.3 04/10/2017   HCT 36.8 04/10/2017   MCV 96.6 04/10/2017   PLT 267 04/10/2017      Chemistry      Component Value Date/Time   NA 139 04/10/2017 1008   NA 141 03/20/2016 1443   K 3.9 04/10/2017 1008   K 3.7 03/20/2016 1443   CL 103 04/10/2017 1008   CL 103 06/08/2012 1407   CO2 28 04/10/2017 1008   CO2 26 03/20/2016 1443   BUN 17 04/10/2017 1008   BUN 13.6 03/20/2016  1443   CREATININE 0.84 04/10/2017 1008   CREATININE 0.8  03/20/2016 1443      Component Value Date/Time   CALCIUM 8.7 04/10/2017 1008   CALCIUM 9.6 03/20/2016 1443   ALKPHOS 53 04/10/2017 1008   ALKPHOS 76 03/20/2016 1443   AST 20 04/10/2017 1008   AST 20 03/20/2016 1443   ALT 15 04/10/2017 1008   ALT 12 03/20/2016 1443   BILITOT 0.3 04/10/2017 1008   BILITOT 0.33 03/20/2016 1443       Lab Results  Component Value Date   LABCA2 8 10/02/2011    No components found for: [id number]  No results for input(s): INR in the last 168 hours.  Urinalysis No results found for: COLORURINE  STUDIES: Bilateral diagnostic mammography with tomography at the breast center 11/20/2016 showed the breast density to be category B.  There was no evidence of malignancy.  ASSESSMENT: 72 y.o. [place] woman status post left upper outer quadrant biopsy 09/24/2011 for a microinvasive ductal carcinoma with mucinous features, grade 1, HER-2 negative, with insufficient carcinoma present for estrogen or progesterone receptor analysis ([id number])  (1) status post left lumpectomy and sentinel lymph node sampling 10/18/2011 showing no residual invasive carcinoma; there was a low-grade ductal carcinoma in situ measuring 1.2 cm, estrogen and progesterone receptor positive, with negative margins.Ashok Croon [id number])  (2) completed radiation therapy to the left breast 01/21/2012  (3) started tamoxifen December 2013, completing 5 years January 2019  (4) osteopenia with a T score of -2.0 on DEXA scan 01/20/2013  (a) repeat bone density 10/23/2015 shows an improved T score at -1.8  PLAN: [name] is now 5-1/2 years out from definitive surgery for her breast cancer with no evidence of disease recurrence.  This is very favorable.  She has completed 5 years of tamoxifen.  She would get minimal to no benefit from continuing an additional 5 years and accordingly we are stopping at this point.  By taking tamoxifen she has cut in half her risk of recurrence and even more  importantly she has cut in half her risk of developing a new breast cancer in the future.  I feel comfortable using vaginal estrogens in patients on tamoxifen but I cannot prescribe it for patients off tamoxifen since there is essentially no data on the safety of using vaginal estrogens on patient with a history of estrogen receptor positive breast cancer on observation alone.  At this point she is being referred back to her primary care physician.  I am giving her 1 more refill on her venlafaxine and gabapentin.  Future refills as she will have to obtain through her primary care doctor.  She knows that if she decides to go off the venlafaxine she will have to taper it off over a period of a month  I have not made any future appointments for her here but will be glad to see her at any point in the future if and when the need   Magrinat, Virgie Dad, MD  04/10/17 11:41 AM Medical Oncology and Hematology [facility], [place] [address] Tel. [phone]    Fax. [phone]  This document serves as a record of services personally performed by Lurline Del, MD. It was created on his behalf by Sheron Nightingale, a trained medical scribe. The creation of this record is based on the scribe's personal observations and the provider's statements to them.   I have reviewed the above documentation for accuracy and completeness, and I agree with  the above.

## 2017-04-10 ENCOUNTER — Inpatient Hospital Stay: Payer: PPO | Attending: Oncology | Admitting: Oncology

## 2017-04-10 ENCOUNTER — Inpatient Hospital Stay: Payer: PPO

## 2017-04-10 VITALS — BP 148/65 | HR 78 | Temp 98.2°F | Resp 20 | Ht 64.0 in | Wt 147.7 lb

## 2017-04-10 DIAGNOSIS — C50412 Malignant neoplasm of upper-outer quadrant of left female breast: Secondary | ICD-10-CM

## 2017-04-10 DIAGNOSIS — Z170 Estrogen receptor positive status [ER+]: Secondary | ICD-10-CM

## 2017-04-10 DIAGNOSIS — Z853 Personal history of malignant neoplasm of breast: Secondary | ICD-10-CM | POA: Insufficient documentation

## 2017-04-10 DIAGNOSIS — M8580 Other specified disorders of bone density and structure, unspecified site: Secondary | ICD-10-CM

## 2017-04-10 DIAGNOSIS — Z923 Personal history of irradiation: Secondary | ICD-10-CM

## 2017-04-10 DIAGNOSIS — R232 Flushing: Secondary | ICD-10-CM

## 2017-04-10 LAB — CBC WITH DIFFERENTIAL/PLATELET
BASOS ABS: 0 10*3/uL (ref 0.0–0.1)
BASOS PCT: 1 %
EOS ABS: 0.4 10*3/uL (ref 0.0–0.5)
EOS PCT: 7 %
HEMATOCRIT: 36.8 % (ref 34.8–46.6)
Hemoglobin: 12.3 g/dL (ref 11.6–15.9)
Lymphocytes Relative: 29 %
Lymphs Abs: 1.8 10*3/uL (ref 0.9–3.3)
MCH: 32.3 pg (ref 25.1–34.0)
MCHC: 33.4 g/dL (ref 31.5–36.0)
MCV: 96.6 fL (ref 79.5–101.0)
MONO ABS: 0.4 10*3/uL (ref 0.1–0.9)
MONOS PCT: 7 %
Neutro Abs: 3.5 10*3/uL (ref 1.5–6.5)
Neutrophils Relative %: 56 %
PLATELETS: 267 10*3/uL (ref 145–400)
RBC: 3.81 MIL/uL (ref 3.70–5.45)
RDW: 13 % (ref 11.2–16.1)
WBC: 6.2 10*3/uL (ref 3.9–10.3)

## 2017-04-10 LAB — COMPREHENSIVE METABOLIC PANEL
ALK PHOS: 53 U/L (ref 40–150)
ALT: 15 U/L (ref 0–55)
ANION GAP: 8 (ref 3–11)
AST: 20 U/L (ref 5–34)
Albumin: 3.5 g/dL (ref 3.5–5.0)
BUN: 17 mg/dL (ref 7–26)
CALCIUM: 8.7 mg/dL (ref 8.4–10.4)
CO2: 28 mmol/L (ref 22–29)
CREATININE: 0.84 mg/dL (ref 0.60–1.10)
Chloride: 103 mmol/L (ref 98–109)
GFR calc Af Amer: >60 mL/min (ref >60)
GFR calc non Af Amer: >60 mL/min (ref >60)
GLUCOSE: 94 mg/dL (ref 70–140)
Potassium: 3.9 mmol/L (ref 3.3–4.7)
SODIUM: 139 mmol/L (ref 136–145)
TOTAL PROTEIN: 6.2 g/dL — AB (ref 6.4–8.3)
Total Bilirubin: 0.3 mg/dL (ref 0.2–1.2)

## 2017-04-10 MED ORDER — GABAPENTIN 300 MG PO CAPS: 300.0000 mg | ORAL_CAPSULE | Freq: Every day | ORAL | 0 refills | Status: AC

## 2017-04-10 MED ORDER — VENLAFAXINE HCL ER 150 MG PO CP24: 150.0000 mg | ORAL_CAPSULE | Freq: Every day | ORAL | 0 refills | Status: DC

## 2017-04-10 NOTE — Addendum Note (Signed)
Addended by: Chauncey Cruel on: 04/10/2017 12:11 PM   Modules accepted: Orders

## 2017-04-11 ENCOUNTER — Telehealth: Payer: Self-pay | Admitting: Oncology

## 2017-04-11 NOTE — Telephone Encounter (Signed)
No 1/24 los.  

## 2017-05-02 DIAGNOSIS — J0100 Acute maxillary sinusitis, unspecified: Secondary | ICD-10-CM | POA: Diagnosis not present

## 2017-05-02 DIAGNOSIS — J069 Acute upper respiratory infection, unspecified: Secondary | ICD-10-CM | POA: Diagnosis not present

## 2017-05-02 DIAGNOSIS — L03119 Cellulitis of unspecified part of limb: Secondary | ICD-10-CM | POA: Diagnosis not present

## 2017-05-21 DIAGNOSIS — H40013 Open angle with borderline findings, low risk, bilateral: Secondary | ICD-10-CM | POA: Diagnosis not present

## 2017-06-19 DIAGNOSIS — M542 Cervicalgia: Secondary | ICD-10-CM | POA: Diagnosis not present

## 2017-06-23 DIAGNOSIS — I10 Essential (primary) hypertension: Secondary | ICD-10-CM | POA: Diagnosis not present

## 2017-06-23 DIAGNOSIS — Z0000 Encounter for general adult medical examination without abnormal findings: Secondary | ICD-10-CM | POA: Diagnosis not present

## 2017-06-23 DIAGNOSIS — I779 Disorder of arteries and arterioles, unspecified: Secondary | ICD-10-CM | POA: Diagnosis not present

## 2017-06-23 DIAGNOSIS — E782 Mixed hyperlipidemia: Secondary | ICD-10-CM | POA: Diagnosis not present

## 2017-06-25 ENCOUNTER — Other Ambulatory Visit: Payer: Self-pay | Admitting: Family Medicine

## 2017-06-25 DIAGNOSIS — M8580 Other specified disorders of bone density and structure, unspecified site: Secondary | ICD-10-CM

## 2017-07-08 ENCOUNTER — Other Ambulatory Visit: Payer: Self-pay | Admitting: Family Medicine

## 2017-07-08 DIAGNOSIS — I739 Peripheral vascular disease, unspecified: Principal | ICD-10-CM

## 2017-07-08 DIAGNOSIS — I779 Disorder of arteries and arterioles, unspecified: Secondary | ICD-10-CM

## 2017-07-29 ENCOUNTER — Other Ambulatory Visit: Payer: Self-pay | Admitting: Oncology

## 2017-07-29 ENCOUNTER — Other Ambulatory Visit: Payer: Self-pay | Admitting: General Surgery

## 2017-07-29 DIAGNOSIS — Z1231 Encounter for screening mammogram for malignant neoplasm of breast: Secondary | ICD-10-CM

## 2017-07-30 ENCOUNTER — Inpatient Hospital Stay
Admission: RE | Admit: 2017-07-30 | Discharge: 2017-07-30 | Disposition: A | Discharge status: Home / Self Care | Payer: PPO | Source: Ambulatory Visit | Attending: Family Medicine | Admitting: Family Medicine

## 2017-08-01 ENCOUNTER — Ambulatory Visit
Admission: RE | Admit: 2017-08-01 | Discharge: 2017-08-01 | Disposition: A | Discharge status: Home / Self Care | Payer: PPO | Source: Ambulatory Visit | Attending: Family Medicine | Admitting: Family Medicine

## 2017-08-01 DIAGNOSIS — I779 Disorder of arteries and arterioles, unspecified: Secondary | ICD-10-CM

## 2017-08-01 DIAGNOSIS — I6523 Occlusion and stenosis of bilateral carotid arteries: Secondary | ICD-10-CM | POA: Diagnosis not present

## 2017-08-01 DIAGNOSIS — I739 Peripheral vascular disease, unspecified: Principal | ICD-10-CM

## 2017-08-30 ENCOUNTER — Other Ambulatory Visit: Payer: Self-pay | Admitting: Oncology

## 2017-08-30 DIAGNOSIS — R232 Flushing: Secondary | ICD-10-CM

## 2017-09-01 DIAGNOSIS — N3281 Overactive bladder: Secondary | ICD-10-CM | POA: Diagnosis not present

## 2017-09-01 DIAGNOSIS — N949 Unspecified condition associated with female genital organs and menstrual cycle: Secondary | ICD-10-CM | POA: Diagnosis not present

## 2017-09-10 DIAGNOSIS — N3946 Mixed incontinence: Secondary | ICD-10-CM | POA: Diagnosis not present

## 2017-09-29 DIAGNOSIS — N3281 Overactive bladder: Secondary | ICD-10-CM | POA: Diagnosis not present

## 2017-09-29 DIAGNOSIS — N949 Unspecified condition associated with female genital organs and menstrual cycle: Secondary | ICD-10-CM | POA: Diagnosis not present

## 2017-09-30 DIAGNOSIS — M545 Low back pain: Secondary | ICD-10-CM | POA: Diagnosis not present

## 2017-09-30 DIAGNOSIS — R413 Other amnesia: Secondary | ICD-10-CM | POA: Diagnosis not present

## 2017-09-30 DIAGNOSIS — M542 Cervicalgia: Secondary | ICD-10-CM | POA: Diagnosis not present

## 2017-10-31 DIAGNOSIS — L819 Disorder of pigmentation, unspecified: Secondary | ICD-10-CM | POA: Diagnosis not present

## 2017-10-31 DIAGNOSIS — D1801 Hemangioma of skin and subcutaneous tissue: Secondary | ICD-10-CM | POA: Diagnosis not present

## 2017-10-31 DIAGNOSIS — D225 Melanocytic nevi of trunk: Secondary | ICD-10-CM | POA: Diagnosis not present

## 2017-10-31 DIAGNOSIS — L814 Other melanin hyperpigmentation: Secondary | ICD-10-CM | POA: Diagnosis not present

## 2017-10-31 DIAGNOSIS — L718 Other rosacea: Secondary | ICD-10-CM | POA: Diagnosis not present

## 2017-10-31 DIAGNOSIS — L821 Other seborrheic keratosis: Secondary | ICD-10-CM | POA: Diagnosis not present

## 2017-10-31 DIAGNOSIS — L300 Nummular dermatitis: Secondary | ICD-10-CM | POA: Diagnosis not present

## 2017-11-07 ENCOUNTER — Telehealth: Payer: Self-pay | Admitting: *Deleted

## 2017-11-07 NOTE — Telephone Encounter (Signed)
"  Have been released butI need refill for Venlafaxine 150 mg.  Called Dr. Marin Comment, with [facility]er at [facility] for nurse but office staff said I need to call you not Dr. Gus Puma nurse.  I really need this today because today is [facility]'s last day open for business.  I think I'll have to change to a [place]."  Routed [facility] last office note, updated care team, pharmacy in addition to instructing patient to call [facility] with second request for nurse and refill.  Denies further needs or questions at this time.

## 2017-11-21 ENCOUNTER — Other Ambulatory Visit: Payer: PPO

## 2017-11-21 ENCOUNTER — Ambulatory Visit: Payer: PPO

## 2017-12-01 DIAGNOSIS — M542 Cervicalgia: Secondary | ICD-10-CM | POA: Diagnosis not present

## 2017-12-17 DIAGNOSIS — H35343 Macular cyst, hole, or pseudohole, bilateral: Secondary | ICD-10-CM | POA: Diagnosis not present

## 2017-12-17 DIAGNOSIS — H02402 Unspecified ptosis of left eyelid: Secondary | ICD-10-CM | POA: Diagnosis not present

## 2017-12-17 DIAGNOSIS — H40013 Open angle with borderline findings, low risk, bilateral: Secondary | ICD-10-CM | POA: Diagnosis not present

## 2017-12-17 DIAGNOSIS — Z9849 Cataract extraction status, unspecified eye: Secondary | ICD-10-CM | POA: Diagnosis not present

## 2017-12-17 DIAGNOSIS — Z961 Presence of intraocular lens: Secondary | ICD-10-CM | POA: Diagnosis not present

## 2017-12-17 DIAGNOSIS — H16223 Keratoconjunctivitis sicca, not specified as Sjogren's, bilateral: Secondary | ICD-10-CM | POA: Diagnosis not present

## 2017-12-22 DIAGNOSIS — E782 Mixed hyperlipidemia: Secondary | ICD-10-CM | POA: Diagnosis not present

## 2017-12-22 DIAGNOSIS — K219 Gastro-esophageal reflux disease without esophagitis: Secondary | ICD-10-CM | POA: Diagnosis not present

## 2017-12-22 DIAGNOSIS — I10 Essential (primary) hypertension: Secondary | ICD-10-CM | POA: Diagnosis not present

## 2017-12-22 DIAGNOSIS — K625 Hemorrhage of anus and rectum: Secondary | ICD-10-CM | POA: Diagnosis not present

## 2017-12-31 DIAGNOSIS — J0100 Acute maxillary sinusitis, unspecified: Secondary | ICD-10-CM | POA: Diagnosis not present

## 2017-12-31 DIAGNOSIS — H9203 Otalgia, bilateral: Secondary | ICD-10-CM | POA: Diagnosis not present

## 2017-12-31 DIAGNOSIS — H6123 Impacted cerumen, bilateral: Secondary | ICD-10-CM | POA: Diagnosis not present

## 2018-01-14 ENCOUNTER — Ambulatory Visit
Admission: RE | Admit: 2018-01-14 | Discharge: 2018-01-14 | Disposition: A | Discharge status: Home / Self Care | Payer: PPO | Source: Ambulatory Visit | Attending: Family Medicine | Admitting: Family Medicine

## 2018-01-14 ENCOUNTER — Ambulatory Visit
Admission: RE | Admit: 2018-01-14 | Discharge: 2018-01-14 | Disposition: A | Discharge status: Home / Self Care | Payer: PPO | Source: Ambulatory Visit | Attending: Oncology | Admitting: Oncology

## 2018-01-14 DIAGNOSIS — M8589 Other specified disorders of bone density and structure, multiple sites: Secondary | ICD-10-CM | POA: Diagnosis not present

## 2018-01-14 DIAGNOSIS — Z780 Asymptomatic menopausal state: Secondary | ICD-10-CM | POA: Diagnosis not present

## 2018-01-14 DIAGNOSIS — Z1231 Encounter for screening mammogram for malignant neoplasm of breast: Secondary | ICD-10-CM | POA: Diagnosis not present

## 2018-01-14 DIAGNOSIS — Z7289 Other problems related to lifestyle: Secondary | ICD-10-CM | POA: Diagnosis not present

## 2018-01-14 DIAGNOSIS — M8580 Other specified disorders of bone density and structure, unspecified site: Secondary | ICD-10-CM

## 2018-01-14 DIAGNOSIS — J343 Hypertrophy of nasal turbinates: Secondary | ICD-10-CM | POA: Diagnosis not present

## 2018-01-14 DIAGNOSIS — H6121 Impacted cerumen, right ear: Secondary | ICD-10-CM | POA: Diagnosis not present

## 2018-01-14 DIAGNOSIS — J302 Other seasonal allergic rhinitis: Secondary | ICD-10-CM | POA: Diagnosis not present

## 2018-01-19 DIAGNOSIS — M7751 Other enthesopathy of right foot: Secondary | ICD-10-CM | POA: Diagnosis not present

## 2018-01-19 DIAGNOSIS — M79671 Pain in right foot: Secondary | ICD-10-CM | POA: Diagnosis not present

## 2018-03-24 DIAGNOSIS — M542 Cervicalgia: Secondary | ICD-10-CM | POA: Diagnosis not present

## 2018-05-22 DIAGNOSIS — R509 Fever, unspecified: Secondary | ICD-10-CM | POA: Diagnosis not present

## 2018-05-22 DIAGNOSIS — J208 Acute bronchitis due to other specified organisms: Secondary | ICD-10-CM | POA: Diagnosis not present

## 2018-05-22 DIAGNOSIS — J988 Other specified respiratory disorders: Secondary | ICD-10-CM | POA: Diagnosis not present

## 2018-06-23 DIAGNOSIS — F341 Dysthymic disorder: Secondary | ICD-10-CM | POA: Diagnosis not present

## 2018-06-23 DIAGNOSIS — M5030 Other cervical disc degeneration, unspecified cervical region: Secondary | ICD-10-CM | POA: Diagnosis not present

## 2018-06-23 DIAGNOSIS — I10 Essential (primary) hypertension: Secondary | ICD-10-CM | POA: Diagnosis not present

## 2018-07-28 DIAGNOSIS — M542 Cervicalgia: Secondary | ICD-10-CM | POA: Diagnosis not present

## 2018-07-28 DIAGNOSIS — R413 Other amnesia: Secondary | ICD-10-CM | POA: Diagnosis not present

## 2018-07-28 DIAGNOSIS — G4700 Insomnia, unspecified: Secondary | ICD-10-CM | POA: Diagnosis not present

## 2018-07-28 DIAGNOSIS — M25512 Pain in left shoulder: Secondary | ICD-10-CM | POA: Diagnosis not present

## 2018-07-28 DIAGNOSIS — M545 Low back pain: Secondary | ICD-10-CM | POA: Diagnosis not present

## 2018-07-28 DIAGNOSIS — G5641 Causalgia of right upper limb: Secondary | ICD-10-CM | POA: Diagnosis not present

## 2018-08-31 DIAGNOSIS — M2550 Pain in unspecified joint: Secondary | ICD-10-CM | POA: Diagnosis not present

## 2018-08-31 DIAGNOSIS — Z0000 Encounter for general adult medical examination without abnormal findings: Secondary | ICD-10-CM | POA: Diagnosis not present

## 2018-08-31 DIAGNOSIS — Z9181 History of falling: Secondary | ICD-10-CM | POA: Diagnosis not present

## 2018-09-02 DIAGNOSIS — M2550 Pain in unspecified joint: Secondary | ICD-10-CM | POA: Diagnosis not present

## 2018-09-04 DIAGNOSIS — M25512 Pain in left shoulder: Secondary | ICD-10-CM | POA: Diagnosis not present

## 2018-09-04 DIAGNOSIS — M545 Low back pain: Secondary | ICD-10-CM | POA: Diagnosis not present

## 2018-11-12 DIAGNOSIS — Z20828 Contact with and (suspected) exposure to other viral communicable diseases: Secondary | ICD-10-CM | POA: Diagnosis not present

## 2018-11-12 DIAGNOSIS — Z01812 Encounter for preprocedural laboratory examination: Secondary | ICD-10-CM | POA: Diagnosis not present

## 2018-11-12 DIAGNOSIS — K219 Gastro-esophageal reflux disease without esophagitis: Secondary | ICD-10-CM | POA: Diagnosis not present

## 2018-11-19 DIAGNOSIS — Z8719 Personal history of other diseases of the digestive system: Secondary | ICD-10-CM | POA: Diagnosis not present

## 2018-11-19 DIAGNOSIS — Z1211 Encounter for screening for malignant neoplasm of colon: Secondary | ICD-10-CM | POA: Diagnosis not present

## 2018-11-19 DIAGNOSIS — K219 Gastro-esophageal reflux disease without esophagitis: Secondary | ICD-10-CM | POA: Diagnosis not present

## 2018-11-30 DIAGNOSIS — G4700 Insomnia, unspecified: Secondary | ICD-10-CM | POA: Diagnosis not present

## 2018-11-30 DIAGNOSIS — M545 Low back pain: Secondary | ICD-10-CM | POA: Diagnosis not present

## 2018-11-30 DIAGNOSIS — M25512 Pain in left shoulder: Secondary | ICD-10-CM | POA: Diagnosis not present

## 2018-11-30 DIAGNOSIS — R413 Other amnesia: Secondary | ICD-10-CM | POA: Diagnosis not present

## 2018-11-30 DIAGNOSIS — M542 Cervicalgia: Secondary | ICD-10-CM | POA: Diagnosis not present

## 2018-12-09 DIAGNOSIS — M542 Cervicalgia: Secondary | ICD-10-CM | POA: Diagnosis not present

## 2018-12-09 DIAGNOSIS — M545 Low back pain: Secondary | ICD-10-CM | POA: Diagnosis not present

## 2018-12-14 DIAGNOSIS — M542 Cervicalgia: Secondary | ICD-10-CM | POA: Diagnosis not present

## 2018-12-14 DIAGNOSIS — M545 Low back pain: Secondary | ICD-10-CM | POA: Diagnosis not present

## 2018-12-16 DIAGNOSIS — M545 Low back pain: Secondary | ICD-10-CM | POA: Diagnosis not present

## 2018-12-16 DIAGNOSIS — M542 Cervicalgia: Secondary | ICD-10-CM | POA: Diagnosis not present

## 2018-12-21 DIAGNOSIS — M542 Cervicalgia: Secondary | ICD-10-CM | POA: Diagnosis not present

## 2018-12-21 DIAGNOSIS — M545 Low back pain: Secondary | ICD-10-CM | POA: Diagnosis not present

## 2018-12-23 DIAGNOSIS — M542 Cervicalgia: Secondary | ICD-10-CM | POA: Diagnosis not present

## 2018-12-23 DIAGNOSIS — Z23 Encounter for immunization: Secondary | ICD-10-CM | POA: Diagnosis not present

## 2018-12-23 DIAGNOSIS — M545 Low back pain: Secondary | ICD-10-CM | POA: Diagnosis not present

## 2018-12-28 DIAGNOSIS — M545 Low back pain: Secondary | ICD-10-CM | POA: Diagnosis not present

## 2018-12-28 DIAGNOSIS — M542 Cervicalgia: Secondary | ICD-10-CM | POA: Diagnosis not present

## 2018-12-30 DIAGNOSIS — M545 Low back pain: Secondary | ICD-10-CM | POA: Diagnosis not present

## 2018-12-30 DIAGNOSIS — M542 Cervicalgia: Secondary | ICD-10-CM | POA: Diagnosis not present

## 2019-01-04 DIAGNOSIS — M545 Low back pain: Secondary | ICD-10-CM | POA: Diagnosis not present

## 2019-01-04 DIAGNOSIS — M542 Cervicalgia: Secondary | ICD-10-CM | POA: Diagnosis not present

## 2019-01-06 DIAGNOSIS — M545 Low back pain: Secondary | ICD-10-CM | POA: Diagnosis not present

## 2019-01-06 DIAGNOSIS — M542 Cervicalgia: Secondary | ICD-10-CM | POA: Diagnosis not present

## 2019-01-11 DIAGNOSIS — M542 Cervicalgia: Secondary | ICD-10-CM | POA: Diagnosis not present

## 2019-01-11 DIAGNOSIS — M545 Low back pain: Secondary | ICD-10-CM | POA: Diagnosis not present

## 2019-01-13 DIAGNOSIS — M545 Low back pain: Secondary | ICD-10-CM | POA: Diagnosis not present

## 2019-01-13 DIAGNOSIS — M542 Cervicalgia: Secondary | ICD-10-CM | POA: Diagnosis not present

## 2019-01-25 DIAGNOSIS — M542 Cervicalgia: Secondary | ICD-10-CM | POA: Diagnosis not present

## 2019-01-25 DIAGNOSIS — M545 Low back pain: Secondary | ICD-10-CM | POA: Diagnosis not present

## 2019-01-27 DIAGNOSIS — M545 Low back pain: Secondary | ICD-10-CM | POA: Diagnosis not present

## 2019-01-27 DIAGNOSIS — M542 Cervicalgia: Secondary | ICD-10-CM | POA: Diagnosis not present

## 2019-02-01 DIAGNOSIS — M545 Low back pain: Secondary | ICD-10-CM | POA: Diagnosis not present

## 2019-02-01 DIAGNOSIS — M542 Cervicalgia: Secondary | ICD-10-CM | POA: Diagnosis not present

## 2019-02-03 DIAGNOSIS — M542 Cervicalgia: Secondary | ICD-10-CM | POA: Diagnosis not present

## 2019-02-03 DIAGNOSIS — M545 Low back pain: Secondary | ICD-10-CM | POA: Diagnosis not present

## 2019-02-15 DIAGNOSIS — D225 Melanocytic nevi of trunk: Secondary | ICD-10-CM | POA: Diagnosis not present

## 2019-02-15 DIAGNOSIS — L821 Other seborrheic keratosis: Secondary | ICD-10-CM | POA: Diagnosis not present

## 2019-02-15 DIAGNOSIS — L718 Other rosacea: Secondary | ICD-10-CM | POA: Diagnosis not present

## 2019-02-15 DIAGNOSIS — D1801 Hemangioma of skin and subcutaneous tissue: Secondary | ICD-10-CM | POA: Diagnosis not present

## 2019-02-15 DIAGNOSIS — L720 Epidermal cyst: Secondary | ICD-10-CM | POA: Diagnosis not present

## 2019-02-15 DIAGNOSIS — L814 Other melanin hyperpigmentation: Secondary | ICD-10-CM | POA: Diagnosis not present

## 2019-02-17 DIAGNOSIS — M542 Cervicalgia: Secondary | ICD-10-CM | POA: Diagnosis not present

## 2019-02-17 DIAGNOSIS — M545 Low back pain: Secondary | ICD-10-CM | POA: Diagnosis not present

## 2019-02-24 DIAGNOSIS — G5641 Causalgia of right upper limb: Secondary | ICD-10-CM | POA: Diagnosis not present

## 2019-02-24 DIAGNOSIS — M542 Cervicalgia: Secondary | ICD-10-CM | POA: Diagnosis not present

## 2019-02-24 DIAGNOSIS — M545 Low back pain: Secondary | ICD-10-CM | POA: Diagnosis not present

## 2019-03-05 ENCOUNTER — Other Ambulatory Visit: Payer: Self-pay | Admitting: Family Medicine

## 2019-03-05 DIAGNOSIS — Z1231 Encounter for screening mammogram for malignant neoplasm of breast: Secondary | ICD-10-CM

## 2019-03-11 DIAGNOSIS — M79631 Pain in right forearm: Secondary | ICD-10-CM | POA: Diagnosis not present

## 2019-03-24 DIAGNOSIS — M545 Low back pain: Secondary | ICD-10-CM | POA: Diagnosis not present

## 2019-03-24 DIAGNOSIS — M542 Cervicalgia: Secondary | ICD-10-CM | POA: Diagnosis not present

## 2019-03-25 ENCOUNTER — Other Ambulatory Visit: Payer: Self-pay

## 2019-03-25 ENCOUNTER — Ambulatory Visit
Admission: RE | Admit: 2019-03-25 | Discharge: 2019-03-25 | Disposition: A | Discharge status: Home / Self Care | Payer: PPO | Source: Ambulatory Visit | Attending: Family Medicine | Admitting: Family Medicine

## 2019-03-25 DIAGNOSIS — Z1231 Encounter for screening mammogram for malignant neoplasm of breast: Secondary | ICD-10-CM

## 2019-04-07 DIAGNOSIS — M542 Cervicalgia: Secondary | ICD-10-CM | POA: Diagnosis not present

## 2019-04-07 DIAGNOSIS — M545 Low back pain: Secondary | ICD-10-CM | POA: Diagnosis not present

## 2019-04-14 DIAGNOSIS — M542 Cervicalgia: Secondary | ICD-10-CM | POA: Diagnosis not present

## 2019-04-14 DIAGNOSIS — M545 Low back pain: Secondary | ICD-10-CM | POA: Diagnosis not present

## 2019-04-20 DIAGNOSIS — M545 Low back pain: Secondary | ICD-10-CM | POA: Diagnosis not present

## 2019-04-20 DIAGNOSIS — M542 Cervicalgia: Secondary | ICD-10-CM | POA: Diagnosis not present

## 2019-04-23 DIAGNOSIS — S7011XA Contusion of right thigh, initial encounter: Secondary | ICD-10-CM | POA: Diagnosis not present

## 2019-04-26 ENCOUNTER — Ambulatory Visit: Payer: PPO

## 2019-04-26 DIAGNOSIS — R0781 Pleurodynia: Secondary | ICD-10-CM | POA: Diagnosis not present

## 2019-04-28 DIAGNOSIS — M545 Low back pain: Secondary | ICD-10-CM | POA: Diagnosis not present

## 2019-04-28 DIAGNOSIS — M542 Cervicalgia: Secondary | ICD-10-CM | POA: Diagnosis not present

## 2019-05-05 DIAGNOSIS — M542 Cervicalgia: Secondary | ICD-10-CM | POA: Diagnosis not present

## 2019-05-05 DIAGNOSIS — M545 Low back pain: Secondary | ICD-10-CM | POA: Diagnosis not present

## 2019-05-13 ENCOUNTER — Ambulatory Visit: Payer: PPO

## 2019-05-31 DIAGNOSIS — H35373 Puckering of macula, bilateral: Secondary | ICD-10-CM | POA: Diagnosis not present

## 2019-07-27 DIAGNOSIS — M5489 Other dorsalgia: Secondary | ICD-10-CM | POA: Diagnosis not present

## 2019-09-09 DIAGNOSIS — M542 Cervicalgia: Secondary | ICD-10-CM | POA: Diagnosis not present

## 2019-09-09 DIAGNOSIS — R413 Other amnesia: Secondary | ICD-10-CM | POA: Diagnosis not present

## 2019-10-11 DIAGNOSIS — M542 Cervicalgia: Secondary | ICD-10-CM | POA: Diagnosis not present

## 2019-10-28 DIAGNOSIS — K219 Gastro-esophageal reflux disease without esophagitis: Secondary | ICD-10-CM | POA: Diagnosis not present

## 2019-10-28 DIAGNOSIS — I10 Essential (primary) hypertension: Secondary | ICD-10-CM | POA: Diagnosis not present

## 2019-10-28 DIAGNOSIS — M4802 Spinal stenosis, cervical region: Secondary | ICD-10-CM | POA: Diagnosis not present

## 2019-11-15 DIAGNOSIS — M545 Low back pain: Secondary | ICD-10-CM | POA: Diagnosis not present

## 2019-11-15 DIAGNOSIS — G8929 Other chronic pain: Secondary | ICD-10-CM | POA: Diagnosis not present

## 2019-12-10 DIAGNOSIS — Z23 Encounter for immunization: Secondary | ICD-10-CM | POA: Diagnosis not present

## 2019-12-10 DIAGNOSIS — E785 Hyperlipidemia, unspecified: Secondary | ICD-10-CM | POA: Diagnosis not present

## 2019-12-10 DIAGNOSIS — Z131 Encounter for screening for diabetes mellitus: Secondary | ICD-10-CM | POA: Diagnosis not present

## 2019-12-10 DIAGNOSIS — Z0000 Encounter for general adult medical examination without abnormal findings: Secondary | ICD-10-CM | POA: Diagnosis not present

## 2020-02-15 DIAGNOSIS — L814 Other melanin hyperpigmentation: Secondary | ICD-10-CM | POA: Diagnosis not present

## 2020-02-15 DIAGNOSIS — L819 Disorder of pigmentation, unspecified: Secondary | ICD-10-CM | POA: Diagnosis not present

## 2020-02-15 DIAGNOSIS — D225 Melanocytic nevi of trunk: Secondary | ICD-10-CM | POA: Diagnosis not present

## 2020-02-15 DIAGNOSIS — L718 Other rosacea: Secondary | ICD-10-CM | POA: Diagnosis not present

## 2020-02-15 DIAGNOSIS — L821 Other seborrheic keratosis: Secondary | ICD-10-CM | POA: Diagnosis not present

## 2020-02-15 DIAGNOSIS — L853 Xerosis cutis: Secondary | ICD-10-CM | POA: Diagnosis not present

## 2020-04-28 ENCOUNTER — Other Ambulatory Visit: Payer: Self-pay | Admitting: Nurse Practitioner

## 2020-04-28 DIAGNOSIS — Z1231 Encounter for screening mammogram for malignant neoplasm of breast: Secondary | ICD-10-CM

## 2020-05-24 DIAGNOSIS — M542 Cervicalgia: Secondary | ICD-10-CM | POA: Diagnosis not present

## 2020-05-26 ENCOUNTER — Ambulatory Visit: Payer: PPO

## 2020-06-06 ENCOUNTER — Other Ambulatory Visit: Payer: Self-pay

## 2020-06-06 ENCOUNTER — Ambulatory Visit
Admission: RE | Admit: 2020-06-06 | Discharge: 2020-06-06 | Disposition: A | Discharge status: Home / Self Care | Payer: PPO | Source: Ambulatory Visit | Attending: Nurse Practitioner | Admitting: Nurse Practitioner

## 2020-06-06 DIAGNOSIS — Z1231 Encounter for screening mammogram for malignant neoplasm of breast: Secondary | ICD-10-CM

## 2020-07-03 DIAGNOSIS — G8929 Other chronic pain: Secondary | ICD-10-CM | POA: Diagnosis not present

## 2020-07-03 DIAGNOSIS — Z853 Personal history of malignant neoplasm of breast: Secondary | ICD-10-CM | POA: Diagnosis not present

## 2020-07-03 DIAGNOSIS — I10 Essential (primary) hypertension: Secondary | ICD-10-CM | POA: Diagnosis not present

## 2020-07-03 DIAGNOSIS — C50919 Malignant neoplasm of unspecified site of unspecified female breast: Secondary | ICD-10-CM | POA: Diagnosis not present

## 2020-07-03 DIAGNOSIS — E782 Mixed hyperlipidemia: Secondary | ICD-10-CM | POA: Diagnosis not present

## 2020-07-03 DIAGNOSIS — E785 Hyperlipidemia, unspecified: Secondary | ICD-10-CM | POA: Diagnosis not present

## 2020-07-03 DIAGNOSIS — M069 Rheumatoid arthritis, unspecified: Secondary | ICD-10-CM | POA: Diagnosis not present

## 2020-07-03 DIAGNOSIS — K219 Gastro-esophageal reflux disease without esophagitis: Secondary | ICD-10-CM | POA: Diagnosis not present

## 2020-07-03 DIAGNOSIS — F341 Dysthymic disorder: Secondary | ICD-10-CM | POA: Diagnosis not present

## 2020-07-03 DIAGNOSIS — M1990 Unspecified osteoarthritis, unspecified site: Secondary | ICD-10-CM | POA: Diagnosis not present

## 2020-07-06 DIAGNOSIS — M542 Cervicalgia: Secondary | ICD-10-CM | POA: Diagnosis not present

## 2020-07-06 DIAGNOSIS — R413 Other amnesia: Secondary | ICD-10-CM | POA: Diagnosis not present

## 2020-07-07 DIAGNOSIS — M1990 Unspecified osteoarthritis, unspecified site: Secondary | ICD-10-CM | POA: Diagnosis not present

## 2020-07-07 DIAGNOSIS — R109 Unspecified abdominal pain: Secondary | ICD-10-CM | POA: Diagnosis not present

## 2020-07-10 ENCOUNTER — Other Ambulatory Visit: Payer: Self-pay | Admitting: Nurse Practitioner

## 2020-07-10 DIAGNOSIS — R109 Unspecified abdominal pain: Secondary | ICD-10-CM

## 2020-07-12 DIAGNOSIS — H35373 Puckering of macula, bilateral: Secondary | ICD-10-CM | POA: Diagnosis not present

## 2020-07-27 ENCOUNTER — Ambulatory Visit
Admission: RE | Admit: 2020-07-27 | Discharge: 2020-07-27 | Disposition: A | Discharge status: Home / Self Care | Payer: PPO | Source: Ambulatory Visit | Attending: Nurse Practitioner | Admitting: Nurse Practitioner

## 2020-07-27 DIAGNOSIS — K8020 Calculus of gallbladder without cholecystitis without obstruction: Secondary | ICD-10-CM | POA: Diagnosis not present

## 2020-07-27 DIAGNOSIS — R109 Unspecified abdominal pain: Secondary | ICD-10-CM

## 2020-07-27 DIAGNOSIS — R160 Hepatomegaly, not elsewhere classified: Secondary | ICD-10-CM | POA: Diagnosis not present

## 2020-07-27 DIAGNOSIS — K7689 Other specified diseases of liver: Secondary | ICD-10-CM | POA: Diagnosis not present

## 2020-07-31 DIAGNOSIS — H57813 Brow ptosis, bilateral: Secondary | ICD-10-CM | POA: Diagnosis not present

## 2020-07-31 DIAGNOSIS — H02423 Myogenic ptosis of bilateral eyelids: Secondary | ICD-10-CM | POA: Diagnosis not present

## 2020-07-31 DIAGNOSIS — H02831 Dermatochalasis of right upper eyelid: Secondary | ICD-10-CM | POA: Diagnosis not present

## 2020-07-31 DIAGNOSIS — H53483 Generalized contraction of visual field, bilateral: Secondary | ICD-10-CM | POA: Diagnosis not present

## 2020-07-31 DIAGNOSIS — H02834 Dermatochalasis of left upper eyelid: Secondary | ICD-10-CM | POA: Diagnosis not present

## 2020-07-31 DIAGNOSIS — H02413 Mechanical ptosis of bilateral eyelids: Secondary | ICD-10-CM | POA: Diagnosis not present

## 2020-07-31 DIAGNOSIS — H0279 Other degenerative disorders of eyelid and periocular area: Secondary | ICD-10-CM | POA: Diagnosis not present

## 2020-08-01 ENCOUNTER — Other Ambulatory Visit: Payer: Self-pay | Admitting: Nurse Practitioner

## 2020-08-01 DIAGNOSIS — R160 Hepatomegaly, not elsewhere classified: Secondary | ICD-10-CM

## 2020-08-02 ENCOUNTER — Other Ambulatory Visit: Payer: Self-pay

## 2020-08-02 ENCOUNTER — Ambulatory Visit
Admission: RE | Admit: 2020-08-02 | Discharge: 2020-08-02 | Disposition: A | Discharge status: Home / Self Care | Payer: PPO | Source: Ambulatory Visit | Attending: Nurse Practitioner | Admitting: Nurse Practitioner

## 2020-08-02 DIAGNOSIS — M4186 Other forms of scoliosis, lumbar region: Secondary | ICD-10-CM | POA: Diagnosis not present

## 2020-08-02 DIAGNOSIS — K760 Fatty (change of) liver, not elsewhere classified: Secondary | ICD-10-CM | POA: Diagnosis not present

## 2020-08-02 DIAGNOSIS — R160 Hepatomegaly, not elsewhere classified: Secondary | ICD-10-CM

## 2020-08-02 DIAGNOSIS — K8020 Calculus of gallbladder without cholecystitis without obstruction: Secondary | ICD-10-CM | POA: Diagnosis not present

## 2020-08-02 MED ORDER — GADOBENATE DIMEGLUMINE 529 MG/ML IV SOLN
13.0000 mL | Freq: Once | INTRAVENOUS | Status: AC | PRN
  Administered 2020-08-02: 13 mL via INTRAVENOUS

## 2020-08-07 DIAGNOSIS — M3500 Sicca syndrome, unspecified: Secondary | ICD-10-CM | POA: Diagnosis not present

## 2020-08-07 DIAGNOSIS — L409 Psoriasis, unspecified: Secondary | ICD-10-CM | POA: Diagnosis not present

## 2020-08-07 DIAGNOSIS — G8929 Other chronic pain: Secondary | ICD-10-CM | POA: Diagnosis not present

## 2020-08-07 DIAGNOSIS — C50919 Malignant neoplasm of unspecified site of unspecified female breast: Secondary | ICD-10-CM | POA: Diagnosis not present

## 2020-08-07 DIAGNOSIS — R5383 Other fatigue: Secondary | ICD-10-CM | POA: Diagnosis not present

## 2020-08-07 DIAGNOSIS — Z6824 Body mass index (BMI) 24.0-24.9, adult: Secondary | ICD-10-CM | POA: Diagnosis not present

## 2020-08-07 DIAGNOSIS — M1990 Unspecified osteoarthritis, unspecified site: Secondary | ICD-10-CM | POA: Diagnosis not present

## 2020-08-07 DIAGNOSIS — E782 Mixed hyperlipidemia: Secondary | ICD-10-CM | POA: Diagnosis not present

## 2020-08-07 DIAGNOSIS — G90511 Complex regional pain syndrome I of right upper limb: Secondary | ICD-10-CM | POA: Diagnosis not present

## 2020-08-07 DIAGNOSIS — K219 Gastro-esophageal reflux disease without esophagitis: Secondary | ICD-10-CM | POA: Diagnosis not present

## 2020-08-07 DIAGNOSIS — M4802 Spinal stenosis, cervical region: Secondary | ICD-10-CM | POA: Diagnosis not present

## 2020-08-07 DIAGNOSIS — I10 Essential (primary) hypertension: Secondary | ICD-10-CM | POA: Diagnosis not present

## 2020-08-07 DIAGNOSIS — E785 Hyperlipidemia, unspecified: Secondary | ICD-10-CM | POA: Diagnosis not present

## 2020-08-07 DIAGNOSIS — M150 Primary generalized (osteo)arthritis: Secondary | ICD-10-CM | POA: Diagnosis not present

## 2020-08-07 DIAGNOSIS — F341 Dysthymic disorder: Secondary | ICD-10-CM | POA: Diagnosis not present

## 2020-08-07 DIAGNOSIS — M069 Rheumatoid arthritis, unspecified: Secondary | ICD-10-CM | POA: Diagnosis not present

## 2020-08-07 DIAGNOSIS — Z853 Personal history of malignant neoplasm of breast: Secondary | ICD-10-CM | POA: Diagnosis not present

## 2020-08-07 DIAGNOSIS — M2550 Pain in unspecified joint: Secondary | ICD-10-CM | POA: Diagnosis not present

## 2020-08-07 DIAGNOSIS — M545 Low back pain, unspecified: Secondary | ICD-10-CM | POA: Diagnosis not present

## 2020-08-08 DIAGNOSIS — M5412 Radiculopathy, cervical region: Secondary | ICD-10-CM | POA: Diagnosis not present

## 2020-09-12 DIAGNOSIS — K219 Gastro-esophageal reflux disease without esophagitis: Secondary | ICD-10-CM | POA: Diagnosis not present

## 2020-09-12 DIAGNOSIS — M069 Rheumatoid arthritis, unspecified: Secondary | ICD-10-CM | POA: Diagnosis not present

## 2020-09-12 DIAGNOSIS — F341 Dysthymic disorder: Secondary | ICD-10-CM | POA: Diagnosis not present

## 2020-09-12 DIAGNOSIS — I10 Essential (primary) hypertension: Secondary | ICD-10-CM | POA: Diagnosis not present

## 2020-09-12 DIAGNOSIS — E782 Mixed hyperlipidemia: Secondary | ICD-10-CM | POA: Diagnosis not present

## 2020-09-12 DIAGNOSIS — M1990 Unspecified osteoarthritis, unspecified site: Secondary | ICD-10-CM | POA: Diagnosis not present

## 2020-09-12 DIAGNOSIS — G8929 Other chronic pain: Secondary | ICD-10-CM | POA: Diagnosis not present

## 2020-09-26 DIAGNOSIS — Z6824 Body mass index (BMI) 24.0-24.9, adult: Secondary | ICD-10-CM | POA: Diagnosis not present

## 2020-09-26 DIAGNOSIS — Z20828 Contact with and (suspected) exposure to other viral communicable diseases: Secondary | ICD-10-CM | POA: Diagnosis not present

## 2020-09-26 DIAGNOSIS — Z20822 Contact with and (suspected) exposure to covid-19: Secondary | ICD-10-CM | POA: Diagnosis not present

## 2020-10-10 DIAGNOSIS — Z853 Personal history of malignant neoplasm of breast: Secondary | ICD-10-CM | POA: Diagnosis not present

## 2020-10-10 DIAGNOSIS — E785 Hyperlipidemia, unspecified: Secondary | ICD-10-CM | POA: Diagnosis not present

## 2020-10-10 DIAGNOSIS — M1990 Unspecified osteoarthritis, unspecified site: Secondary | ICD-10-CM | POA: Diagnosis not present

## 2020-10-10 DIAGNOSIS — K219 Gastro-esophageal reflux disease without esophagitis: Secondary | ICD-10-CM | POA: Diagnosis not present

## 2020-10-10 DIAGNOSIS — I10 Essential (primary) hypertension: Secondary | ICD-10-CM | POA: Diagnosis not present

## 2020-10-10 DIAGNOSIS — E782 Mixed hyperlipidemia: Secondary | ICD-10-CM | POA: Diagnosis not present

## 2020-10-10 DIAGNOSIS — F341 Dysthymic disorder: Secondary | ICD-10-CM | POA: Diagnosis not present

## 2020-10-10 DIAGNOSIS — C50919 Malignant neoplasm of unspecified site of unspecified female breast: Secondary | ICD-10-CM | POA: Diagnosis not present

## 2020-10-10 DIAGNOSIS — M069 Rheumatoid arthritis, unspecified: Secondary | ICD-10-CM | POA: Diagnosis not present

## 2020-10-10 DIAGNOSIS — G8929 Other chronic pain: Secondary | ICD-10-CM | POA: Diagnosis not present

## 2020-10-31 DIAGNOSIS — M1990 Unspecified osteoarthritis, unspecified site: Secondary | ICD-10-CM | POA: Diagnosis not present

## 2020-10-31 DIAGNOSIS — C50919 Malignant neoplasm of unspecified site of unspecified female breast: Secondary | ICD-10-CM | POA: Diagnosis not present

## 2020-10-31 DIAGNOSIS — Z853 Personal history of malignant neoplasm of breast: Secondary | ICD-10-CM | POA: Diagnosis not present

## 2020-10-31 DIAGNOSIS — M069 Rheumatoid arthritis, unspecified: Secondary | ICD-10-CM | POA: Diagnosis not present

## 2020-10-31 DIAGNOSIS — E782 Mixed hyperlipidemia: Secondary | ICD-10-CM | POA: Diagnosis not present

## 2020-10-31 DIAGNOSIS — F341 Dysthymic disorder: Secondary | ICD-10-CM | POA: Diagnosis not present

## 2020-10-31 DIAGNOSIS — E785 Hyperlipidemia, unspecified: Secondary | ICD-10-CM | POA: Diagnosis not present

## 2020-10-31 DIAGNOSIS — I10 Essential (primary) hypertension: Secondary | ICD-10-CM | POA: Diagnosis not present

## 2020-10-31 DIAGNOSIS — G8929 Other chronic pain: Secondary | ICD-10-CM | POA: Diagnosis not present

## 2020-10-31 DIAGNOSIS — K219 Gastro-esophageal reflux disease without esophagitis: Secondary | ICD-10-CM | POA: Diagnosis not present

## 2020-11-24 DIAGNOSIS — M4802 Spinal stenosis, cervical region: Secondary | ICD-10-CM | POA: Diagnosis not present

## 2020-12-04 DIAGNOSIS — M1990 Unspecified osteoarthritis, unspecified site: Secondary | ICD-10-CM | POA: Diagnosis not present

## 2020-12-04 DIAGNOSIS — G8929 Other chronic pain: Secondary | ICD-10-CM | POA: Diagnosis not present

## 2020-12-04 DIAGNOSIS — K219 Gastro-esophageal reflux disease without esophagitis: Secondary | ICD-10-CM | POA: Diagnosis not present

## 2020-12-04 DIAGNOSIS — I10 Essential (primary) hypertension: Secondary | ICD-10-CM | POA: Diagnosis not present

## 2020-12-04 DIAGNOSIS — E782 Mixed hyperlipidemia: Secondary | ICD-10-CM | POA: Diagnosis not present

## 2020-12-04 DIAGNOSIS — M069 Rheumatoid arthritis, unspecified: Secondary | ICD-10-CM | POA: Diagnosis not present

## 2020-12-04 DIAGNOSIS — F341 Dysthymic disorder: Secondary | ICD-10-CM | POA: Diagnosis not present

## 2020-12-29 DIAGNOSIS — J069 Acute upper respiratory infection, unspecified: Secondary | ICD-10-CM | POA: Diagnosis not present

## 2021-02-13 DIAGNOSIS — C50912 Malignant neoplasm of unspecified site of left female breast: Secondary | ICD-10-CM | POA: Diagnosis not present

## 2021-02-20 DIAGNOSIS — Z23 Encounter for immunization: Secondary | ICD-10-CM | POA: Diagnosis not present

## 2021-02-20 DIAGNOSIS — E782 Mixed hyperlipidemia: Secondary | ICD-10-CM | POA: Diagnosis not present

## 2021-02-20 DIAGNOSIS — I10 Essential (primary) hypertension: Secondary | ICD-10-CM | POA: Diagnosis not present

## 2021-02-20 DIAGNOSIS — L821 Other seborrheic keratosis: Secondary | ICD-10-CM | POA: Diagnosis not present

## 2021-02-20 DIAGNOSIS — D225 Melanocytic nevi of trunk: Secondary | ICD-10-CM | POA: Diagnosis not present

## 2021-02-20 DIAGNOSIS — L814 Other melanin hyperpigmentation: Secondary | ICD-10-CM | POA: Diagnosis not present

## 2021-02-20 DIAGNOSIS — Z0000 Encounter for general adult medical examination without abnormal findings: Secondary | ICD-10-CM | POA: Diagnosis not present

## 2021-02-20 DIAGNOSIS — L718 Other rosacea: Secondary | ICD-10-CM | POA: Diagnosis not present

## 2021-02-20 DIAGNOSIS — L218 Other seborrheic dermatitis: Secondary | ICD-10-CM | POA: Diagnosis not present

## 2021-02-28 DIAGNOSIS — C50912 Malignant neoplasm of unspecified site of left female breast: Secondary | ICD-10-CM | POA: Diagnosis not present

## 2021-04-12 DIAGNOSIS — F341 Dysthymic disorder: Secondary | ICD-10-CM | POA: Diagnosis not present

## 2021-04-12 DIAGNOSIS — K219 Gastro-esophageal reflux disease without esophagitis: Secondary | ICD-10-CM | POA: Diagnosis not present

## 2021-04-12 DIAGNOSIS — C50919 Malignant neoplasm of unspecified site of unspecified female breast: Secondary | ICD-10-CM | POA: Diagnosis not present

## 2021-04-12 DIAGNOSIS — G8929 Other chronic pain: Secondary | ICD-10-CM | POA: Diagnosis not present

## 2021-04-12 DIAGNOSIS — M069 Rheumatoid arthritis, unspecified: Secondary | ICD-10-CM | POA: Diagnosis not present

## 2021-04-12 DIAGNOSIS — Z853 Personal history of malignant neoplasm of breast: Secondary | ICD-10-CM | POA: Diagnosis not present

## 2021-04-12 DIAGNOSIS — E782 Mixed hyperlipidemia: Secondary | ICD-10-CM | POA: Diagnosis not present

## 2021-04-12 DIAGNOSIS — I10 Essential (primary) hypertension: Secondary | ICD-10-CM | POA: Diagnosis not present

## 2021-04-12 DIAGNOSIS — M1990 Unspecified osteoarthritis, unspecified site: Secondary | ICD-10-CM | POA: Diagnosis not present

## 2021-04-27 DIAGNOSIS — G8929 Other chronic pain: Secondary | ICD-10-CM | POA: Diagnosis not present

## 2021-04-27 DIAGNOSIS — G90511 Complex regional pain syndrome I of right upper limb: Secondary | ICD-10-CM | POA: Diagnosis not present

## 2021-04-27 DIAGNOSIS — I10 Essential (primary) hypertension: Secondary | ICD-10-CM | POA: Diagnosis not present

## 2021-04-27 DIAGNOSIS — K219 Gastro-esophageal reflux disease without esophagitis: Secondary | ICD-10-CM | POA: Diagnosis not present

## 2021-04-27 DIAGNOSIS — F341 Dysthymic disorder: Secondary | ICD-10-CM | POA: Diagnosis not present

## 2021-05-02 ENCOUNTER — Other Ambulatory Visit: Payer: Self-pay | Admitting: Nurse Practitioner

## 2021-05-02 DIAGNOSIS — Z1231 Encounter for screening mammogram for malignant neoplasm of breast: Secondary | ICD-10-CM

## 2021-05-10 DIAGNOSIS — M5411 Radiculopathy, occipito-atlanto-axial region: Secondary | ICD-10-CM | POA: Diagnosis not present

## 2021-05-10 DIAGNOSIS — M9901 Segmental and somatic dysfunction of cervical region: Secondary | ICD-10-CM | POA: Diagnosis not present

## 2021-05-14 DIAGNOSIS — M5411 Radiculopathy, occipito-atlanto-axial region: Secondary | ICD-10-CM | POA: Diagnosis not present

## 2021-05-14 DIAGNOSIS — M9901 Segmental and somatic dysfunction of cervical region: Secondary | ICD-10-CM | POA: Diagnosis not present

## 2021-05-16 DIAGNOSIS — M5411 Radiculopathy, occipito-atlanto-axial region: Secondary | ICD-10-CM | POA: Diagnosis not present

## 2021-05-16 DIAGNOSIS — M9901 Segmental and somatic dysfunction of cervical region: Secondary | ICD-10-CM | POA: Diagnosis not present

## 2021-05-21 DIAGNOSIS — M9901 Segmental and somatic dysfunction of cervical region: Secondary | ICD-10-CM | POA: Diagnosis not present

## 2021-05-21 DIAGNOSIS — M5411 Radiculopathy, occipito-atlanto-axial region: Secondary | ICD-10-CM | POA: Diagnosis not present

## 2021-05-23 DIAGNOSIS — M9901 Segmental and somatic dysfunction of cervical region: Secondary | ICD-10-CM | POA: Diagnosis not present

## 2021-05-23 DIAGNOSIS — M5411 Radiculopathy, occipito-atlanto-axial region: Secondary | ICD-10-CM | POA: Diagnosis not present

## 2021-05-28 DIAGNOSIS — M9901 Segmental and somatic dysfunction of cervical region: Secondary | ICD-10-CM | POA: Diagnosis not present

## 2021-05-28 DIAGNOSIS — M5411 Radiculopathy, occipito-atlanto-axial region: Secondary | ICD-10-CM | POA: Diagnosis not present

## 2021-05-31 DIAGNOSIS — M5411 Radiculopathy, occipito-atlanto-axial region: Secondary | ICD-10-CM | POA: Diagnosis not present

## 2021-05-31 DIAGNOSIS — M9901 Segmental and somatic dysfunction of cervical region: Secondary | ICD-10-CM | POA: Diagnosis not present

## 2021-06-07 ENCOUNTER — Ambulatory Visit: Payer: PPO

## 2021-06-08 ENCOUNTER — Ambulatory Visit
Admission: RE | Admit: 2021-06-08 | Discharge: 2021-06-08 | Disposition: A | Discharge status: Home / Self Care | Payer: PPO | Source: Ambulatory Visit | Attending: Nurse Practitioner | Admitting: Nurse Practitioner

## 2021-06-08 DIAGNOSIS — Z1231 Encounter for screening mammogram for malignant neoplasm of breast: Secondary | ICD-10-CM | POA: Diagnosis not present

## 2021-06-18 DIAGNOSIS — R519 Headache, unspecified: Secondary | ICD-10-CM | POA: Diagnosis not present

## 2021-06-18 DIAGNOSIS — J029 Acute pharyngitis, unspecified: Secondary | ICD-10-CM | POA: Diagnosis not present

## 2021-06-18 DIAGNOSIS — I10 Essential (primary) hypertension: Secondary | ICD-10-CM | POA: Diagnosis not present

## 2021-06-18 DIAGNOSIS — R059 Cough, unspecified: Secondary | ICD-10-CM | POA: Diagnosis not present

## 2021-06-18 DIAGNOSIS — J40 Bronchitis, not specified as acute or chronic: Secondary | ICD-10-CM | POA: Diagnosis not present

## 2021-06-18 DIAGNOSIS — Z20822 Contact with and (suspected) exposure to covid-19: Secondary | ICD-10-CM | POA: Diagnosis not present

## 2021-07-05 DIAGNOSIS — M1990 Unspecified osteoarthritis, unspecified site: Secondary | ICD-10-CM | POA: Diagnosis not present

## 2021-07-05 DIAGNOSIS — K219 Gastro-esophageal reflux disease without esophagitis: Secondary | ICD-10-CM | POA: Diagnosis not present

## 2021-07-05 DIAGNOSIS — I10 Essential (primary) hypertension: Secondary | ICD-10-CM | POA: Diagnosis not present

## 2021-07-05 DIAGNOSIS — E782 Mixed hyperlipidemia: Secondary | ICD-10-CM | POA: Diagnosis not present

## 2021-07-05 DIAGNOSIS — G8929 Other chronic pain: Secondary | ICD-10-CM | POA: Diagnosis not present

## 2021-07-09 ENCOUNTER — Other Ambulatory Visit: Payer: Self-pay | Admitting: Family Medicine

## 2021-07-09 DIAGNOSIS — M810 Age-related osteoporosis without current pathological fracture: Secondary | ICD-10-CM

## 2021-07-27 DIAGNOSIS — E782 Mixed hyperlipidemia: Secondary | ICD-10-CM | POA: Diagnosis not present

## 2021-07-27 DIAGNOSIS — M1990 Unspecified osteoarthritis, unspecified site: Secondary | ICD-10-CM | POA: Diagnosis not present

## 2021-07-27 DIAGNOSIS — G8929 Other chronic pain: Secondary | ICD-10-CM | POA: Diagnosis not present

## 2021-07-27 DIAGNOSIS — I10 Essential (primary) hypertension: Secondary | ICD-10-CM | POA: Diagnosis not present

## 2021-07-27 DIAGNOSIS — K219 Gastro-esophageal reflux disease without esophagitis: Secondary | ICD-10-CM | POA: Diagnosis not present

## 2021-08-07 DIAGNOSIS — M542 Cervicalgia: Secondary | ICD-10-CM | POA: Diagnosis not present

## 2021-08-07 DIAGNOSIS — M4802 Spinal stenosis, cervical region: Secondary | ICD-10-CM | POA: Diagnosis not present

## 2021-08-22 DIAGNOSIS — H35373 Puckering of macula, bilateral: Secondary | ICD-10-CM | POA: Diagnosis not present

## 2021-10-16 DIAGNOSIS — G90511 Complex regional pain syndrome I of right upper limb: Secondary | ICD-10-CM | POA: Diagnosis not present

## 2021-10-16 DIAGNOSIS — E782 Mixed hyperlipidemia: Secondary | ICD-10-CM | POA: Diagnosis not present

## 2021-10-16 DIAGNOSIS — G894 Chronic pain syndrome: Secondary | ICD-10-CM | POA: Diagnosis not present

## 2021-10-16 DIAGNOSIS — M5030 Other cervical disc degeneration, unspecified cervical region: Secondary | ICD-10-CM | POA: Diagnosis not present

## 2021-11-01 DIAGNOSIS — M13841 Other specified arthritis, right hand: Secondary | ICD-10-CM | POA: Diagnosis not present

## 2021-11-01 DIAGNOSIS — M79641 Pain in right hand: Secondary | ICD-10-CM | POA: Diagnosis not present

## 2021-11-01 DIAGNOSIS — G90519 Complex regional pain syndrome I of unspecified upper limb: Secondary | ICD-10-CM | POA: Diagnosis not present

## 2021-11-06 DIAGNOSIS — K219 Gastro-esophageal reflux disease without esophagitis: Secondary | ICD-10-CM | POA: Diagnosis not present

## 2021-11-06 DIAGNOSIS — G8929 Other chronic pain: Secondary | ICD-10-CM | POA: Diagnosis not present

## 2021-11-06 DIAGNOSIS — I10 Essential (primary) hypertension: Secondary | ICD-10-CM | POA: Diagnosis not present

## 2021-11-06 DIAGNOSIS — M1990 Unspecified osteoarthritis, unspecified site: Secondary | ICD-10-CM | POA: Diagnosis not present

## 2021-11-06 DIAGNOSIS — E782 Mixed hyperlipidemia: Secondary | ICD-10-CM | POA: Diagnosis not present

## 2021-11-13 DIAGNOSIS — H02834 Dermatochalasis of left upper eyelid: Secondary | ICD-10-CM | POA: Diagnosis not present

## 2021-11-13 DIAGNOSIS — H02831 Dermatochalasis of right upper eyelid: Secondary | ICD-10-CM | POA: Diagnosis not present

## 2021-12-21 DIAGNOSIS — Z23 Encounter for immunization: Secondary | ICD-10-CM | POA: Diagnosis not present

## 2021-12-21 DIAGNOSIS — K5909 Other constipation: Secondary | ICD-10-CM | POA: Diagnosis not present

## 2021-12-27 ENCOUNTER — Other Ambulatory Visit: Payer: Self-pay | Admitting: Family Medicine

## 2021-12-27 DIAGNOSIS — Z13820 Encounter for screening for osteoporosis: Secondary | ICD-10-CM

## 2021-12-28 DIAGNOSIS — R197 Diarrhea, unspecified: Secondary | ICD-10-CM | POA: Diagnosis not present

## 2022-01-01 ENCOUNTER — Other Ambulatory Visit: Payer: PPO

## 2022-01-02 DIAGNOSIS — K582 Mixed irritable bowel syndrome: Secondary | ICD-10-CM | POA: Diagnosis not present

## 2022-01-16 DIAGNOSIS — Z9849 Cataract extraction status, unspecified eye: Secondary | ICD-10-CM | POA: Diagnosis not present

## 2022-01-16 DIAGNOSIS — M8580 Other specified disorders of bone density and structure, unspecified site: Secondary | ICD-10-CM | POA: Diagnosis not present

## 2022-01-16 DIAGNOSIS — M3500 Sicca syndrome, unspecified: Secondary | ICD-10-CM | POA: Diagnosis not present

## 2022-01-16 DIAGNOSIS — F3342 Major depressive disorder, recurrent, in full remission: Secondary | ICD-10-CM | POA: Diagnosis not present

## 2022-01-16 DIAGNOSIS — G8929 Other chronic pain: Secondary | ICD-10-CM | POA: Diagnosis not present

## 2022-01-16 DIAGNOSIS — E785 Hyperlipidemia, unspecified: Secondary | ICD-10-CM | POA: Diagnosis not present

## 2022-01-16 DIAGNOSIS — I10 Essential (primary) hypertension: Secondary | ICD-10-CM | POA: Diagnosis not present

## 2022-01-16 DIAGNOSIS — Z853 Personal history of malignant neoplasm of breast: Secondary | ICD-10-CM | POA: Diagnosis not present

## 2022-01-16 DIAGNOSIS — K219 Gastro-esophageal reflux disease without esophagitis: Secondary | ICD-10-CM | POA: Diagnosis not present

## 2022-01-16 DIAGNOSIS — L409 Psoriasis, unspecified: Secondary | ICD-10-CM | POA: Diagnosis not present

## 2022-01-16 DIAGNOSIS — K589 Irritable bowel syndrome without diarrhea: Secondary | ICD-10-CM | POA: Diagnosis not present

## 2022-01-16 DIAGNOSIS — E663 Overweight: Secondary | ICD-10-CM | POA: Diagnosis not present

## 2022-01-21 DIAGNOSIS — G90511 Complex regional pain syndrome I of right upper limb: Secondary | ICD-10-CM | POA: Diagnosis not present

## 2022-01-21 DIAGNOSIS — R197 Diarrhea, unspecified: Secondary | ICD-10-CM | POA: Diagnosis not present

## 2022-01-21 DIAGNOSIS — M5030 Other cervical disc degeneration, unspecified cervical region: Secondary | ICD-10-CM | POA: Diagnosis not present

## 2022-01-21 DIAGNOSIS — Z1211 Encounter for screening for malignant neoplasm of colon: Secondary | ICD-10-CM | POA: Diagnosis not present

## 2022-01-21 DIAGNOSIS — I878 Other specified disorders of veins: Secondary | ICD-10-CM | POA: Diagnosis not present

## 2022-01-21 DIAGNOSIS — Z1231 Encounter for screening mammogram for malignant neoplasm of breast: Secondary | ICD-10-CM | POA: Diagnosis not present

## 2022-01-21 DIAGNOSIS — K5900 Constipation, unspecified: Secondary | ICD-10-CM | POA: Diagnosis not present

## 2022-01-21 DIAGNOSIS — R1030 Lower abdominal pain, unspecified: Secondary | ICD-10-CM | POA: Diagnosis not present

## 2022-01-21 DIAGNOSIS — Z8719 Personal history of other diseases of the digestive system: Secondary | ICD-10-CM | POA: Diagnosis not present

## 2022-01-21 DIAGNOSIS — Z13820 Encounter for screening for osteoporosis: Secondary | ICD-10-CM | POA: Diagnosis not present

## 2022-01-21 DIAGNOSIS — K581 Irritable bowel syndrome with constipation: Secondary | ICD-10-CM | POA: Diagnosis not present

## 2022-01-21 DIAGNOSIS — Z23 Encounter for immunization: Secondary | ICD-10-CM | POA: Diagnosis not present

## 2022-01-21 DIAGNOSIS — M8589 Other specified disorders of bone density and structure, multiple sites: Secondary | ICD-10-CM | POA: Diagnosis not present

## 2022-01-21 DIAGNOSIS — Z0000 Encounter for general adult medical examination without abnormal findings: Secondary | ICD-10-CM | POA: Diagnosis not present

## 2022-01-21 DIAGNOSIS — M419 Scoliosis, unspecified: Secondary | ICD-10-CM | POA: Diagnosis not present

## 2022-01-21 DIAGNOSIS — Z853 Personal history of malignant neoplasm of breast: Secondary | ICD-10-CM | POA: Diagnosis not present

## 2022-01-21 DIAGNOSIS — E782 Mixed hyperlipidemia: Secondary | ICD-10-CM | POA: Diagnosis not present

## 2022-01-21 DIAGNOSIS — I10 Essential (primary) hypertension: Secondary | ICD-10-CM | POA: Diagnosis not present

## 2022-01-22 ENCOUNTER — Other Ambulatory Visit: Payer: Self-pay | Admitting: Family Medicine

## 2022-01-22 DIAGNOSIS — E782 Mixed hyperlipidemia: Secondary | ICD-10-CM | POA: Diagnosis not present

## 2022-01-22 DIAGNOSIS — K219 Gastro-esophageal reflux disease without esophagitis: Secondary | ICD-10-CM | POA: Diagnosis not present

## 2022-01-22 DIAGNOSIS — I10 Essential (primary) hypertension: Secondary | ICD-10-CM | POA: Diagnosis not present

## 2022-01-22 DIAGNOSIS — Z1231 Encounter for screening mammogram for malignant neoplasm of breast: Secondary | ICD-10-CM

## 2022-01-22 DIAGNOSIS — M1990 Unspecified osteoarthritis, unspecified site: Secondary | ICD-10-CM | POA: Diagnosis not present

## 2022-01-22 DIAGNOSIS — F341 Dysthymic disorder: Secondary | ICD-10-CM | POA: Diagnosis not present

## 2022-01-22 DIAGNOSIS — G8929 Other chronic pain: Secondary | ICD-10-CM | POA: Diagnosis not present

## 2022-01-30 DIAGNOSIS — R197 Diarrhea, unspecified: Secondary | ICD-10-CM | POA: Diagnosis not present

## 2022-01-30 DIAGNOSIS — K5989 Other specified functional intestinal disorders: Secondary | ICD-10-CM | POA: Diagnosis not present

## 2022-01-30 DIAGNOSIS — K5900 Constipation, unspecified: Secondary | ICD-10-CM | POA: Diagnosis not present

## 2022-03-04 DIAGNOSIS — K5909 Other constipation: Secondary | ICD-10-CM | POA: Diagnosis not present

## 2022-04-09 DIAGNOSIS — C50412 Malignant neoplasm of upper-outer quadrant of left female breast: Secondary | ICD-10-CM | POA: Diagnosis not present

## 2022-04-15 ENCOUNTER — Other Ambulatory Visit: Payer: Self-pay | Admitting: Family Medicine

## 2022-04-15 DIAGNOSIS — C50412 Malignant neoplasm of upper-outer quadrant of left female breast: Secondary | ICD-10-CM

## 2022-04-23 ENCOUNTER — Ambulatory Visit
Admission: RE | Admit: 2022-04-23 | Discharge: 2022-04-23 | Disposition: A | Discharge status: Home / Self Care | Payer: PPO | Source: Ambulatory Visit | Attending: Family Medicine | Admitting: Family Medicine

## 2022-04-23 ENCOUNTER — Other Ambulatory Visit: Payer: Self-pay | Admitting: Family Medicine

## 2022-04-23 DIAGNOSIS — C50412 Malignant neoplasm of upper-outer quadrant of left female breast: Secondary | ICD-10-CM

## 2022-04-23 DIAGNOSIS — N644 Mastodynia: Secondary | ICD-10-CM | POA: Diagnosis not present

## 2022-05-02 DIAGNOSIS — L718 Other rosacea: Secondary | ICD-10-CM | POA: Diagnosis not present

## 2022-05-02 DIAGNOSIS — L814 Other melanin hyperpigmentation: Secondary | ICD-10-CM | POA: Diagnosis not present

## 2022-05-02 DIAGNOSIS — L218 Other seborrheic dermatitis: Secondary | ICD-10-CM | POA: Diagnosis not present

## 2022-05-02 DIAGNOSIS — L720 Epidermal cyst: Secondary | ICD-10-CM | POA: Diagnosis not present

## 2022-05-02 DIAGNOSIS — L821 Other seborrheic keratosis: Secondary | ICD-10-CM | POA: Diagnosis not present

## 2022-05-02 DIAGNOSIS — L659 Nonscarring hair loss, unspecified: Secondary | ICD-10-CM | POA: Diagnosis not present

## 2022-05-02 DIAGNOSIS — D225 Melanocytic nevi of trunk: Secondary | ICD-10-CM | POA: Diagnosis not present

## 2022-05-07 DIAGNOSIS — R1030 Lower abdominal pain, unspecified: Secondary | ICD-10-CM | POA: Diagnosis not present

## 2022-05-07 DIAGNOSIS — R1032 Left lower quadrant pain: Secondary | ICD-10-CM | POA: Diagnosis not present

## 2022-05-07 DIAGNOSIS — R197 Diarrhea, unspecified: Secondary | ICD-10-CM | POA: Diagnosis not present

## 2022-05-07 DIAGNOSIS — R1031 Right lower quadrant pain: Secondary | ICD-10-CM | POA: Diagnosis not present

## 2022-05-07 DIAGNOSIS — R194 Change in bowel habit: Secondary | ICD-10-CM | POA: Diagnosis not present

## 2022-05-20 DIAGNOSIS — K5909 Other constipation: Secondary | ICD-10-CM | POA: Diagnosis not present

## 2022-05-20 DIAGNOSIS — R1084 Generalized abdominal pain: Secondary | ICD-10-CM | POA: Diagnosis not present

## 2022-05-24 IMAGING — MG MM DIGITAL SCREENING BILAT W/ TOMO AND CAD
6 of 10 series · 6 of 30 positions shown · non-contrast
Comparison: Previous exam(s).

CLINICAL DATA: Screening.

EXAM:
DIGITAL SCREENING BILATERAL MAMMOGRAM WITH TOMOSYNTHESIS AND CAD
TECHNIQUE: Bilateral screening digital craniocaudal and mediolateral oblique
mammograms were obtained. Bilateral screening digital breast
tomosynthesis was performed. The images were evaluated with
computer-aided detection.

[L CC synth-2D (1 of 2)]
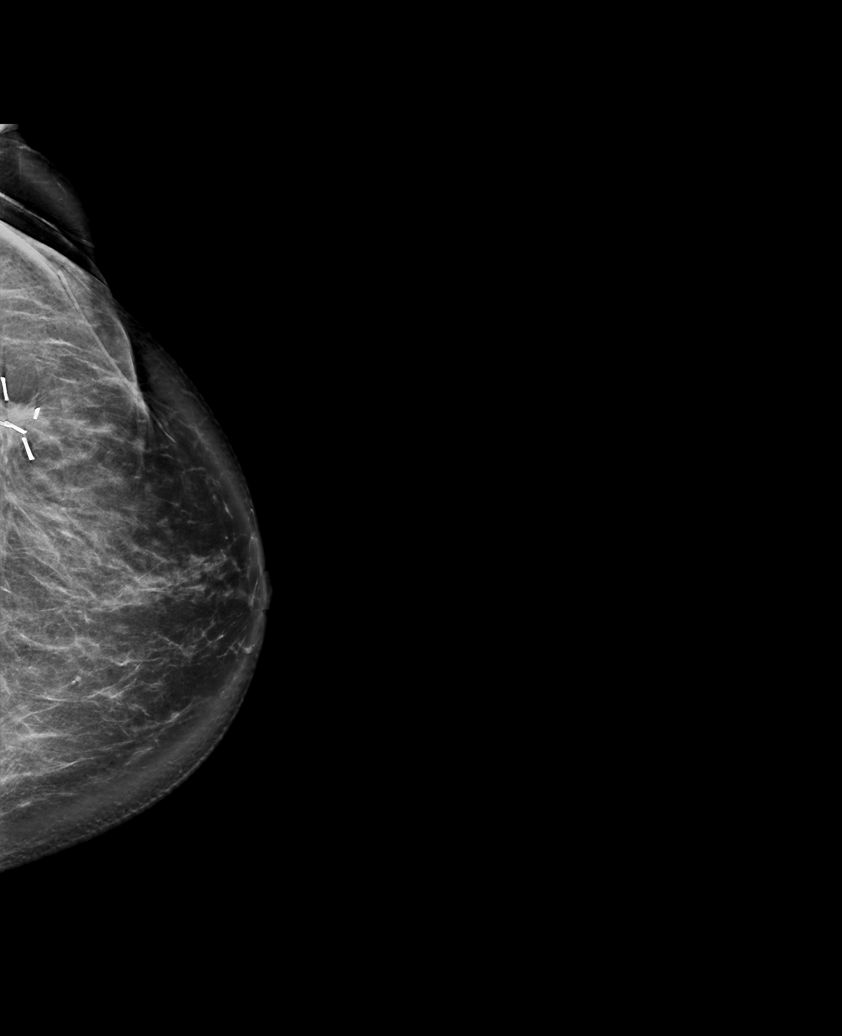

[L CC synth-2D (2 of 2)]
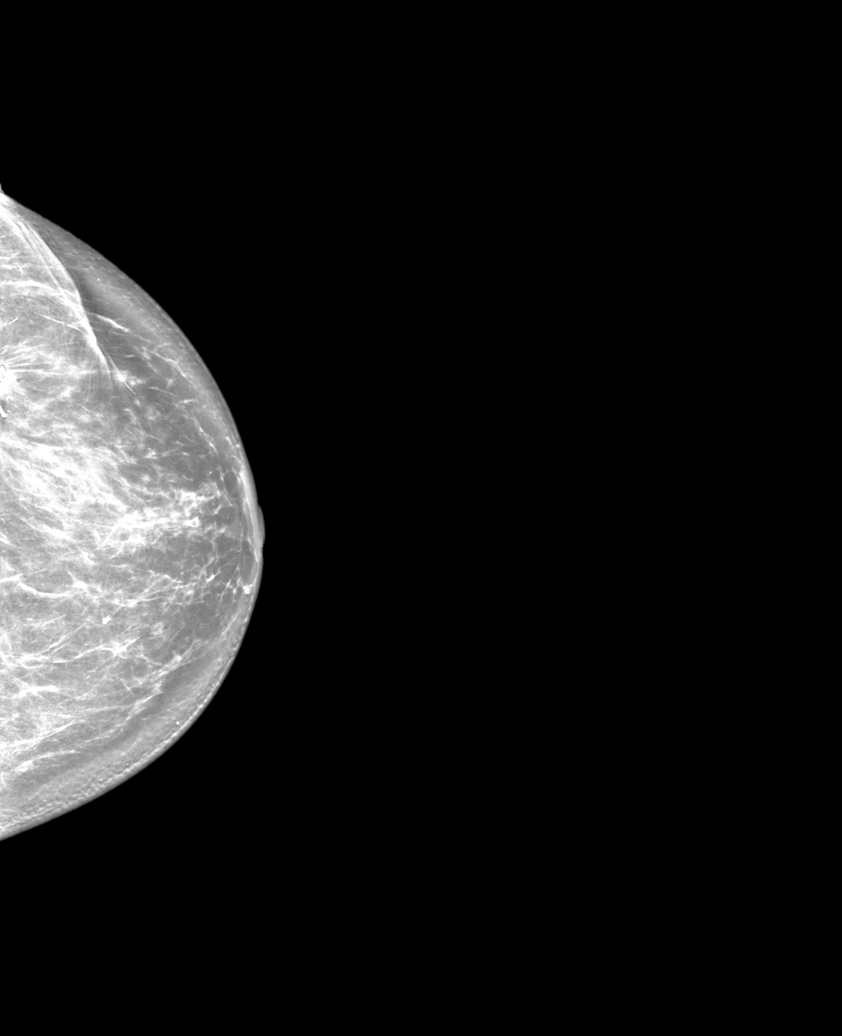

[L MLO synth-2D]
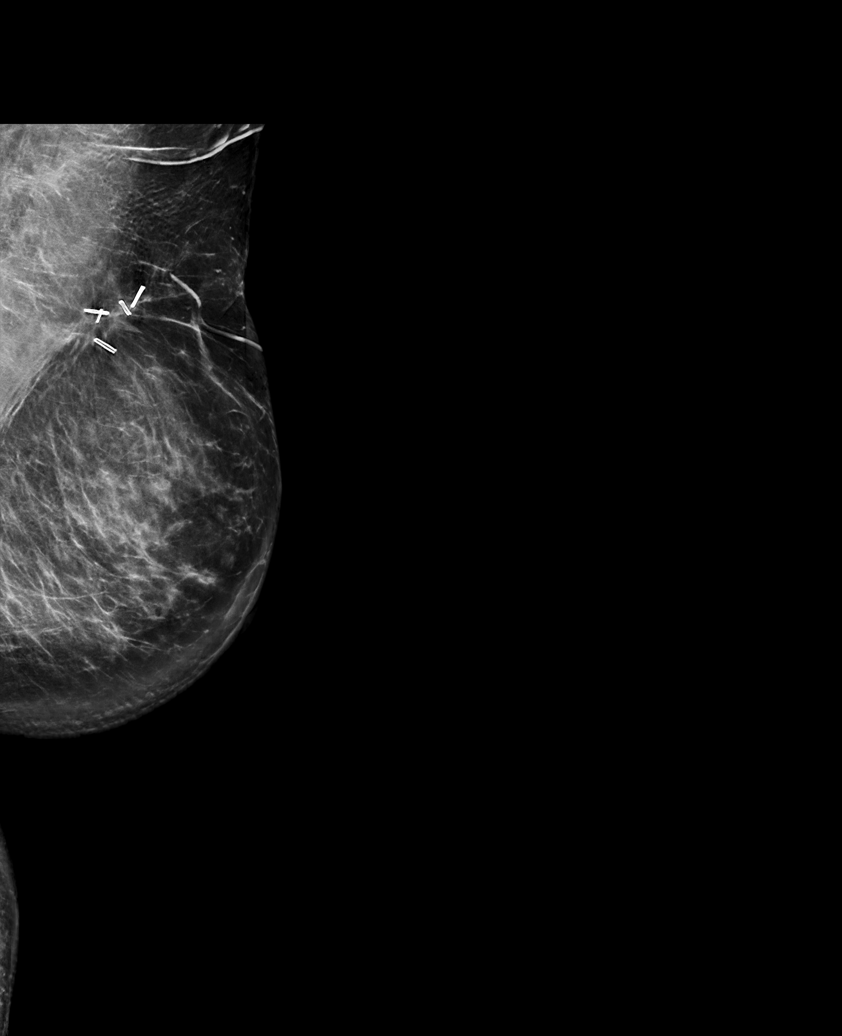

[R CC synth-2D]
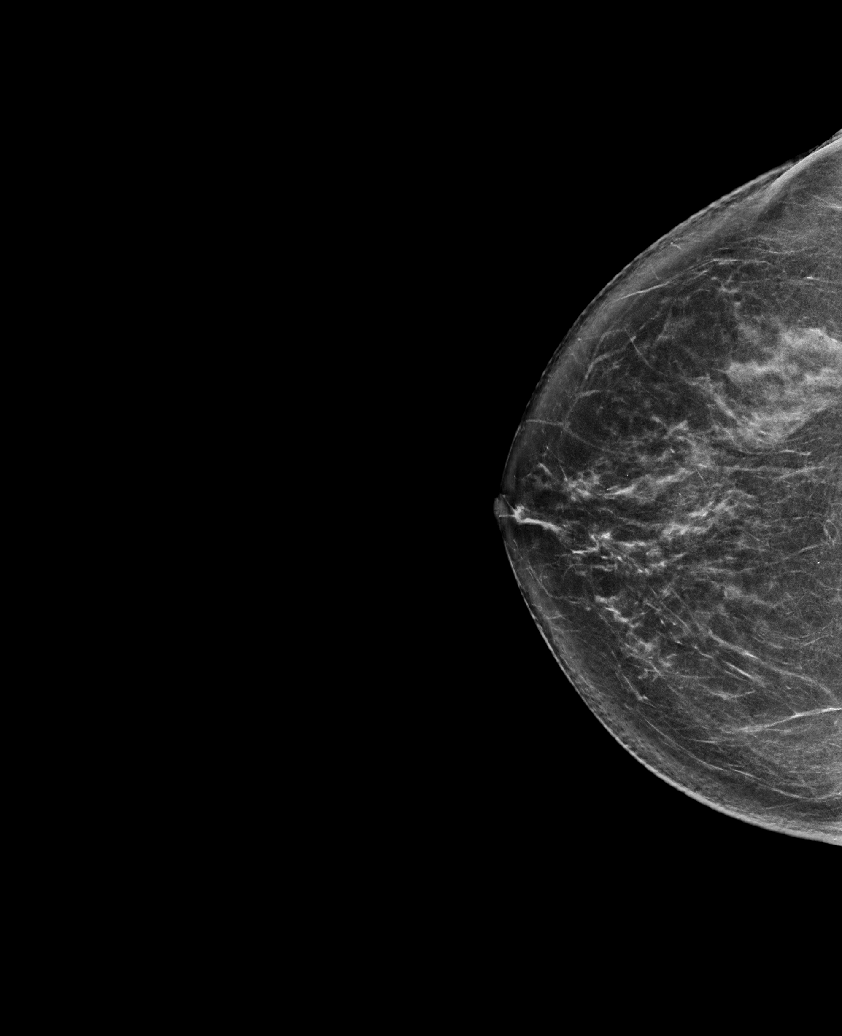

[R MLO synth-2D]
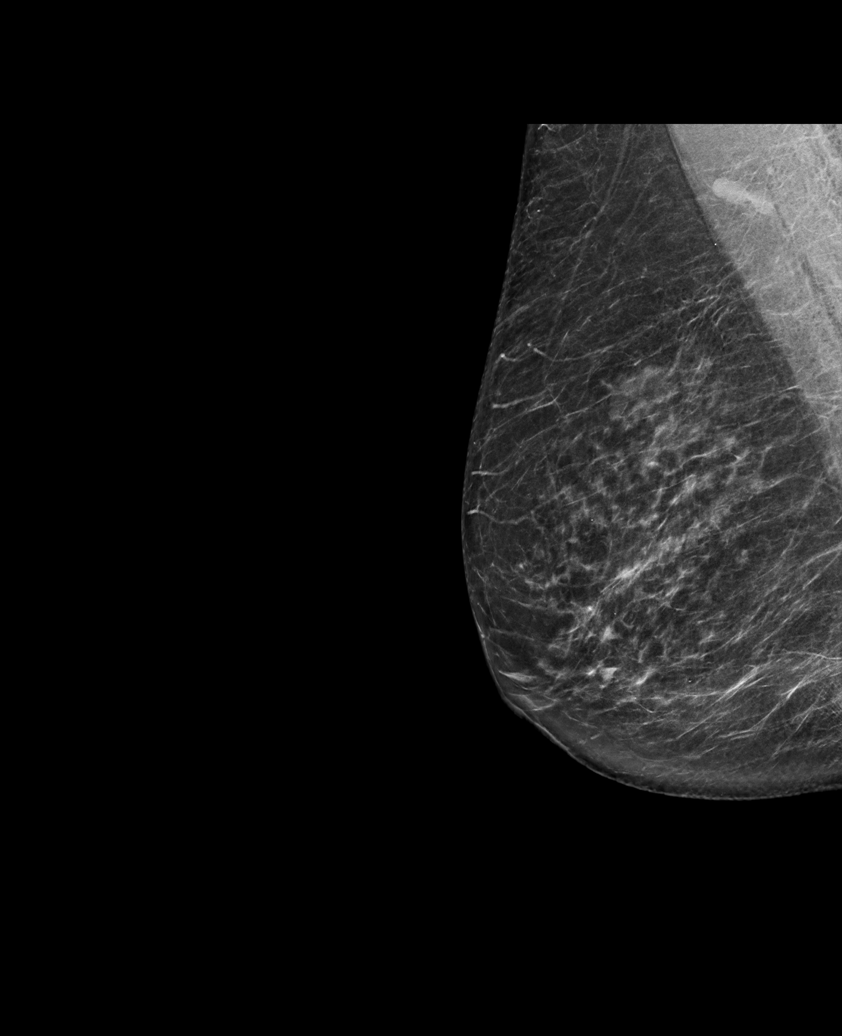

[R MLO tomo · tomo slice 39/77.0]
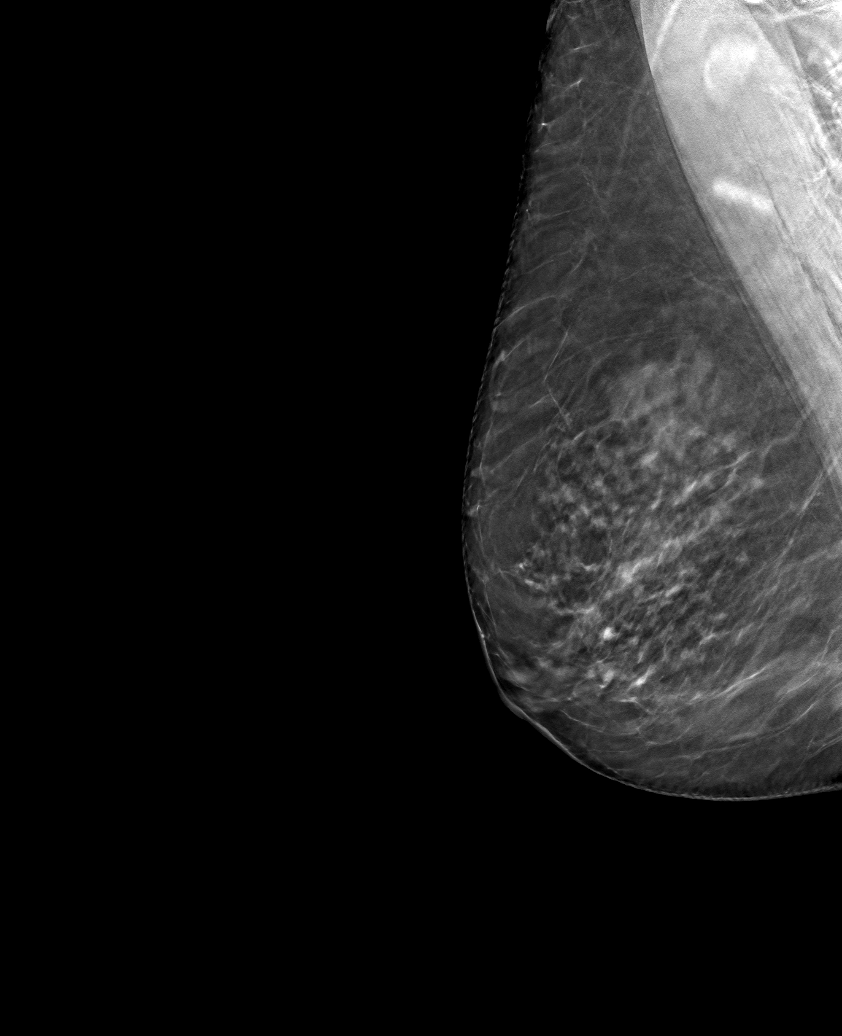

[6 of 30 positions shown; findings below may reference images not displayed]

ACR Breast Density Category c: The breast tissue is heterogeneously
dense, which may obscure small masses.
FINDINGS: There are no findings suspicious for malignancy.
IMPRESSION: No mammographic evidence of malignancy. A result letter of this
screening mammogram will be mailed directly to the patient.

RECOMMENDATION:
Screening mammogram in one year. (Code:Q3-W-BC3)

BI-RADS CATEGORY  1: Negative.

## 2022-06-10 ENCOUNTER — Ambulatory Visit
Admission: RE | Admit: 2022-06-10 | Discharge: 2022-06-10 | Disposition: A | Discharge status: Home / Self Care | Payer: PPO | Source: Ambulatory Visit | Attending: Family Medicine | Admitting: Family Medicine

## 2022-06-10 ENCOUNTER — Ambulatory Visit: Payer: PPO

## 2022-06-10 DIAGNOSIS — Z13820 Encounter for screening for osteoporosis: Secondary | ICD-10-CM

## 2022-06-10 DIAGNOSIS — Z780 Asymptomatic menopausal state: Secondary | ICD-10-CM | POA: Diagnosis not present

## 2022-06-10 DIAGNOSIS — M8589 Other specified disorders of bone density and structure, multiple sites: Secondary | ICD-10-CM | POA: Diagnosis not present

## 2022-06-11 ENCOUNTER — Ambulatory Visit: Payer: PPO

## 2022-07-29 DIAGNOSIS — I10 Essential (primary) hypertension: Secondary | ICD-10-CM | POA: Diagnosis not present

## 2022-07-29 DIAGNOSIS — M1990 Unspecified osteoarthritis, unspecified site: Secondary | ICD-10-CM | POA: Diagnosis not present

## 2022-07-29 DIAGNOSIS — S161XXA Strain of muscle, fascia and tendon at neck level, initial encounter: Secondary | ICD-10-CM | POA: Diagnosis not present

## 2022-07-29 DIAGNOSIS — G90511 Complex regional pain syndrome I of right upper limb: Secondary | ICD-10-CM | POA: Diagnosis not present

## 2022-07-29 DIAGNOSIS — E782 Mixed hyperlipidemia: Secondary | ICD-10-CM | POA: Diagnosis not present

## 2022-08-05 ENCOUNTER — Ambulatory Visit: Admit: 2022-08-05 | Payer: PPO

## 2022-08-05 SURGERY — COLONOSCOPY WITH PROPOFOL
Anesthesia: General

## 2022-09-18 DIAGNOSIS — Z713 Dietary counseling and surveillance: Secondary | ICD-10-CM | POA: Diagnosis not present

## 2022-09-18 DIAGNOSIS — I10 Essential (primary) hypertension: Secondary | ICD-10-CM | POA: Diagnosis not present

## 2022-09-18 DIAGNOSIS — E782 Mixed hyperlipidemia: Secondary | ICD-10-CM | POA: Diagnosis not present

## 2022-10-29 DIAGNOSIS — H40013 Open angle with borderline findings, low risk, bilateral: Secondary | ICD-10-CM | POA: Diagnosis not present

## 2022-10-29 DIAGNOSIS — H524 Presbyopia: Secondary | ICD-10-CM | POA: Diagnosis not present

## 2022-11-11 DIAGNOSIS — Z713 Dietary counseling and surveillance: Secondary | ICD-10-CM | POA: Diagnosis not present

## 2022-11-11 DIAGNOSIS — I10 Essential (primary) hypertension: Secondary | ICD-10-CM | POA: Diagnosis not present

## 2022-11-11 DIAGNOSIS — E782 Mixed hyperlipidemia: Secondary | ICD-10-CM | POA: Diagnosis not present

## 2022-12-03 DIAGNOSIS — I110 Hypertensive heart disease with heart failure: Secondary | ICD-10-CM | POA: Diagnosis not present

## 2022-12-03 DIAGNOSIS — G629 Polyneuropathy, unspecified: Secondary | ICD-10-CM | POA: Diagnosis not present

## 2022-12-03 DIAGNOSIS — C50412 Malignant neoplasm of upper-outer quadrant of left female breast: Secondary | ICD-10-CM | POA: Diagnosis not present

## 2022-12-03 DIAGNOSIS — G43909 Migraine, unspecified, not intractable, without status migrainosus: Secondary | ICD-10-CM | POA: Diagnosis not present

## 2022-12-03 DIAGNOSIS — F3342 Major depressive disorder, recurrent, in full remission: Secondary | ICD-10-CM | POA: Diagnosis not present

## 2022-12-03 DIAGNOSIS — F419 Anxiety disorder, unspecified: Secondary | ICD-10-CM | POA: Diagnosis not present

## 2022-12-03 DIAGNOSIS — E785 Hyperlipidemia, unspecified: Secondary | ICD-10-CM | POA: Diagnosis not present

## 2022-12-03 DIAGNOSIS — G8929 Other chronic pain: Secondary | ICD-10-CM | POA: Diagnosis not present

## 2022-12-03 DIAGNOSIS — I509 Heart failure, unspecified: Secondary | ICD-10-CM | POA: Diagnosis not present

## 2022-12-03 DIAGNOSIS — F341 Dysthymic disorder: Secondary | ICD-10-CM | POA: Diagnosis not present

## 2022-12-03 DIAGNOSIS — E559 Vitamin D deficiency, unspecified: Secondary | ICD-10-CM | POA: Diagnosis not present

## 2022-12-03 DIAGNOSIS — E663 Overweight: Secondary | ICD-10-CM | POA: Diagnosis not present

## 2022-12-30 DIAGNOSIS — K581 Irritable bowel syndrome with constipation: Secondary | ICD-10-CM | POA: Diagnosis not present

## 2022-12-30 DIAGNOSIS — K219 Gastro-esophageal reflux disease without esophagitis: Secondary | ICD-10-CM | POA: Diagnosis not present

## 2022-12-30 DIAGNOSIS — K6389 Other specified diseases of intestine: Secondary | ICD-10-CM | POA: Diagnosis not present

## 2023-01-08 DIAGNOSIS — L309 Dermatitis, unspecified: Secondary | ICD-10-CM | POA: Diagnosis not present

## 2023-01-17 DIAGNOSIS — M542 Cervicalgia: Secondary | ICD-10-CM | POA: Diagnosis not present

## 2023-01-17 DIAGNOSIS — G8929 Other chronic pain: Secondary | ICD-10-CM | POA: Diagnosis not present

## 2023-01-17 DIAGNOSIS — Z853 Personal history of malignant neoplasm of breast: Secondary | ICD-10-CM | POA: Diagnosis not present

## 2023-01-17 DIAGNOSIS — E875 Hyperkalemia: Secondary | ICD-10-CM | POA: Diagnosis not present

## 2023-01-17 DIAGNOSIS — D581 Hereditary elliptocytosis: Secondary | ICD-10-CM | POA: Diagnosis not present

## 2023-01-24 DIAGNOSIS — Z0000 Encounter for general adult medical examination without abnormal findings: Secondary | ICD-10-CM | POA: Diagnosis not present

## 2023-01-24 DIAGNOSIS — I10 Essential (primary) hypertension: Secondary | ICD-10-CM | POA: Diagnosis not present

## 2023-01-24 DIAGNOSIS — G894 Chronic pain syndrome: Secondary | ICD-10-CM | POA: Diagnosis not present

## 2023-01-24 DIAGNOSIS — Z1231 Encounter for screening mammogram for malignant neoplasm of breast: Secondary | ICD-10-CM | POA: Diagnosis not present

## 2023-01-24 DIAGNOSIS — M5030 Other cervical disc degeneration, unspecified cervical region: Secondary | ICD-10-CM | POA: Diagnosis not present

## 2023-01-24 DIAGNOSIS — E782 Mixed hyperlipidemia: Secondary | ICD-10-CM | POA: Diagnosis not present

## 2023-01-24 DIAGNOSIS — Z1211 Encounter for screening for malignant neoplasm of colon: Secondary | ICD-10-CM | POA: Diagnosis not present

## 2023-01-24 DIAGNOSIS — G90511 Complex regional pain syndrome I of right upper limb: Secondary | ICD-10-CM | POA: Diagnosis not present

## 2023-01-31 DIAGNOSIS — L810 Postinflammatory hyperpigmentation: Secondary | ICD-10-CM | POA: Diagnosis not present

## 2023-01-31 DIAGNOSIS — I872 Venous insufficiency (chronic) (peripheral): Secondary | ICD-10-CM | POA: Diagnosis not present

## 2023-01-31 DIAGNOSIS — M310 Hypersensitivity angiitis: Secondary | ICD-10-CM | POA: Diagnosis not present

## 2023-02-07 DIAGNOSIS — M542 Cervicalgia: Secondary | ICD-10-CM | POA: Diagnosis not present

## 2023-02-07 DIAGNOSIS — M5412 Radiculopathy, cervical region: Secondary | ICD-10-CM | POA: Diagnosis not present

## 2023-05-01 DIAGNOSIS — N898 Other specified noninflammatory disorders of vagina: Secondary | ICD-10-CM | POA: Diagnosis not present

## 2023-05-26 DIAGNOSIS — K581 Irritable bowel syndrome with constipation: Secondary | ICD-10-CM | POA: Diagnosis not present

## 2023-06-02 DIAGNOSIS — K581 Irritable bowel syndrome with constipation: Secondary | ICD-10-CM | POA: Diagnosis not present

## 2023-06-05 ENCOUNTER — Encounter: Payer: Self-pay | Admitting: Cardiology

## 2023-06-05 DIAGNOSIS — L309 Dermatitis, unspecified: Secondary | ICD-10-CM | POA: Diagnosis not present

## 2023-06-05 DIAGNOSIS — L820 Inflamed seborrheic keratosis: Secondary | ICD-10-CM | POA: Diagnosis not present

## 2023-06-05 DIAGNOSIS — L538 Other specified erythematous conditions: Secondary | ICD-10-CM | POA: Diagnosis not present

## 2023-06-05 NOTE — Progress Notes (Signed)
 Cardiology Office Note   Date:  06/08/2023   ID:  Demisha, Nokes 1946/02/05, MRN [id number]  PCP:  Koren Shiver, DO  Cardiologist:   Rollene Rotunda, MD Referring:    Chief Complaint  Patient presents with   Chest Pain      History of Present Illness: [name] is a 78 y.o. female who presents for evaluation who has multiple cardiovascular risk factors and chest pain.  She has had no prior cardiac history but she does have very strong family history of early coronary artery disease.   She reported that she went to an urgent care probably around Christmas with this chest discomfort but I cannot find these results.  She reported that it was sharp to dull pain in the left of her sternum.  There was no radiation at that time to her jaw or to her arms but at other times she has had this.  She was not sure whether it felt like her reflux and she is not quite sure what they did for her at the urgent care but she was not sent to the emergency room.  She just did not feel well after that.  She did not have associated nausea vomiting or diaphoresis.  She did not have any new palpitations, presyncope or syncope.  She has not been fatigued since then.  She does not do as much physical activity.  She does a little vacuuming and she gets very tired with this.  She reports that she had stress testing years ago.   Past Medical History:  Diagnosis Date   Abnormal C-reactive protein    Anxiety    Arthritis    Breast cancer (HCC) 10/18/2011   dcis, left breast, ER/PR +   Depression    Dysrhythmia    palpitations   GERD (gastroesophageal reflux disease)    H/O bone density study    History of radiation therapy 12/24/11 -01/21/12   left breast   Hyperlipidemia    Hypertension    IBS (irritable bowel syndrome)    Personal history of radiation therapy 2013   Left Breast Cancer   Psoriasis     Past Surgical History:  Procedure Laterality Date   ABDOMINAL HYSTERECTOMY      BREAST LUMPECTOMY Left 10/18/2011   BREAST SURGERY  10/18/2011   LT br lumpectomy   CATARACT EXTRACTION     COLONOSCOPY     DILATION AND CURETTAGE OF UTERUS     EYE SURGERY     HAND SURGERY     RSD   NM MYOVIEW LTD  10/19/2008   normal   TONSILLECTOMY AND ADENOIDECTOMY     UPPER GASTROINTESTINAL ENDOSCOPY       Current Outpatient Medications  Medication Sig Dispense Refill   BIOTIN PO Take 550 mg by mouth.     CALCIUM PO Take by mouth.     diclofenac sodium (VOLTAREN) 1 % GEL Apply 1 application topically.     gabapentin (NEURONTIN) 300 MG capsule Take 1 capsule (300 mg total) by mouth at bedtime. (Patient taking differently: Take 300 mg by mouth 3 (three) times daily.) 90 capsule 0   ibuprofen (ADVIL,MOTRIN) 200 MG tablet Take 200 mg by mouth every 6 (six) hours as needed.     LINZESS 72 MCG capsule Take 72 mcg by mouth daily.     lisinopril-hydrochlorothiazide (ZESTORETIC) 10-12.5 MG per tablet Take 1 tablet by mouth daily. 90 tablet 2   meloxicam (MOBIC) 7.5 MG  tablet Take 7.5 mg by mouth daily.     metoprolol tartrate (LOPRESSOR) 100 MG tablet Take 1 tablet (100 mg total) by mouth once for 1 dose. TAKE TWO HOURS PRIOR TO  SCHEDULE CARDIAC TEST 1 tablet 0   metroNIDAZOLE (METROGEL) 0.75 % gel Apply 1 application topically daily.     Multiple Vitamin (MULTIVITAMIN) tablet Take 1 tablet by mouth daily.     nortriptyline (PAMELOR) 25 MG capsule Take 50 mg by mouth.      Omega-3 Fatty Acids (FISH OIL PO) Take 1 each by mouth.     pantoprazole (PROTONIX) 40 MG tablet Take 1 tablet by mouth every other day.     RESTASIS 0.05 % ophthalmic emulsion Apply 2 drops to eye daily.     rosuvastatin (CRESTOR) 5 MG tablet Take 5 mg by mouth at bedtime.     tretinoin (RETIN-A) 0.05 % cream Apply topically at bedtime.     triamcinolone cream (KENALOG) 0.1 % Apply 1 Application topically as needed.     venlafaxine XR (EFFEXOR-XR) 150 MG 24 hr capsule TAKE ONE TABLET BY MOUTH EVERY DAY WITH  BREAKFAST 90 capsule 0   MAGNESIUM PO Take by mouth. (Patient not taking: Reported on 06/06/2023)     predniSONE (STERAPRED UNI-PAK) 10 MG tablet  (Patient not taking: Reported on 06/06/2023)     VITAMIN E PO Take by mouth. (Patient not taking: Reported on 06/06/2023)     No current facility-administered medications for this visit.    Allergies:   Statins and Tape    Social History:  The patient  reports that she quit smoking about 41 years ago. Her smoking use included cigarettes. She has never used smokeless tobacco. She reports current alcohol use of about 7.0 standard drinks of alcohol per week. She reports that she does not use drugs.   Family History:  The patient's family history includes Cancer in her cousin and paternal grandmother; Heart attack (age of onset: 52) in her brother; Heart attack (age of onset: 80) in her father; Hyperlipidemia in her brother and mother; Hypertension (age of onset: 86) in her brother.    ROS:  Please see the history of present illness.   Otherwise, review of systems are positive for none.   All other systems are reviewed and negative.    PHYSICAL EXAM: VS:  BP (!) 122/58 (BP Location: Left Arm, Patient Position: Sitting, Cuff Size: Normal)   Pulse 90   Ht 5\' 4"  (1.626 m)   Wt 148 lb 12.8 oz (67.5 kg)   SpO2 92%   BMI 25.54 kg/m  , BMI Body mass index is 25.54 kg/m. GENERAL:  Well appearing HEENT:  Pupils equal round and reactive, fundi not visualized, oral mucosa unremarkable NECK:  No jugular venous distention, waveform within normal limits, carotid upstroke brisk and symmetric, no bruits, no thyromegaly LYMPHATICS:  No cervical, inguinal adenopathy LUNGS:  Clear to auscultation bilaterally BACK:  No CVA tenderness CHEST:  Unremarkable HEART:  PMI not displaced or sustained,S1 and S2 within normal limits, no S3, no S4, no clicks, no rubs, no murmurs ABD:  Flat, positive bowel sounds normal in frequency in pitch, no bruits, no rebound, no  guarding, no midline pulsatile mass, no hepatomegaly, no splenomegaly EXT:  2 plus pulses throughout, no edema, no cyanosis no clubbing SKIN:  No rashes no nodules NEURO:  Cranial nerves II through XII grossly intact, motor grossly intact throughout PSYCH:  Cognitively intact, oriented to person place and time  EKG:  EKG Interpretation Date/Time:  Friday June 06 2023 14:20:36 EDT Ventricular Rate:  90 PR Interval:  158 QRS Duration:  82 QT Interval:  380 QTC Calculation: 464 R Axis:   80  Text Interpretation: Normal sinus rhythm Normal ECG No previous ECGs available Confirmed by Rollene Rotunda ([id number]) on 06/06/2023 2:44:57 PM     Recent Labs: No results found for requested labs within last 365 days.    Lipid Panel No results found for: "CHOL", "TRIG", "HDL", "CHOLHDL", "VLDL", "LDLCALC", "LDLDIRECT"    Wt Readings from Last 3 Encounters:  06/06/23 148 lb 12.8 oz (67.5 kg)  04/10/17 147 lb 11.2 oz (67 kg)  03/20/16 147 lb 3.2 oz (66.8 kg)      Other studies Reviewed: Additional studies/ records that were reviewed today include: labs. Review of the above records demonstrates:  Please see elsewhere in the note.     ASSESSMENT AND PLAN:  HTN: Her blood pressure is well-controlled.  She will continue the meds as listed.  Dyslipidemia: LDL is 75 with an HDL of 69.  She does not like taking the Crestor because she thinks it causes some cramping.  For now we will continue with this dosing goals of therapy will depend on the testing below.  Chest discomfort: There is anginal greater than nonanginal features.  She has significant cardiovascular risk factors.  The pretest probability of obstructive coronary disease is moderately high.  I will proceed with coronary CTA.  Further evaluation will be based on this.  Current medicines are reviewed at length with the patient today.  The patient does not have concerns regarding medicines.  The following changes have been made:  no  change  Labs/ tests ordered today include:   Orders Placed This Encounter  Procedures   CT CORONARY MORPH W/CTA COR W/SCORE W/CA W/CM &/OR WO/CM   EKG 12-Lead     Disposition:   FU with with me based on the results of the above   Signed, Rollene Rotunda, MD  06/08/2023 1:56 PM    [facility]

## 2023-06-06 ENCOUNTER — Ambulatory Visit: Payer: PPO | Attending: Cardiology | Admitting: Cardiology

## 2023-06-06 ENCOUNTER — Encounter: Payer: Self-pay | Admitting: Cardiology

## 2023-06-06 VITALS — BP 122/58 | HR 90 | Ht 64.0 in | Wt 148.8 lb

## 2023-06-06 DIAGNOSIS — R072 Precordial pain: Secondary | ICD-10-CM

## 2023-06-06 DIAGNOSIS — I10 Essential (primary) hypertension: Secondary | ICD-10-CM

## 2023-06-06 DIAGNOSIS — E785 Hyperlipidemia, unspecified: Secondary | ICD-10-CM

## 2023-06-06 MED ORDER — METOPROLOL TARTRATE 100 MG PO TABS: 100.0000 mg | ORAL_TABLET | Freq: Once | ORAL | 0 refills | Status: AC

## 2023-06-06 NOTE — Patient Instructions (Addendum)
 Medication Instructions:  Metoprolol as directed 2 hours prior to CTA. *If you need a refill on your cardiac medications before your next appointment, please call your pharmacy*    Testing/Procedures:   Your cardiac CT will be scheduled at one of the below locations:   [facility] [address] [place], [place] [address] [phone]    Please arrive at the [facility] and Children's Entrance (Entrance C2) of [facility] 30 minutes prior to test start time. You can use the FREE valet parking offered at entrance C (encouraged to control the heart rate for the test)  Proceed to the [facility] Radiology Department (first floor) to check-in and test prep.  All radiology patients and guests should use entrance C2 at [facility], accessed from [facility], even though the hospital's physical address listed is [address].        Please follow these instructions carefully (unless otherwise directed):  An IV will be required for this test and Nitroglycerin will be given.    On the Night Before the Test: Be sure to Drink plenty of water. Do not consume any caffeinated/decaffeinated beverages or chocolate 12 hours prior to your test. Do not take any antihistamines 12 hours prior to your test.   On the Day of the Test: Drink plenty of water until 1 hour prior to the test. Do not eat any food 1 hour prior to test. You may take your regular medications prior to the test.  Take metoprolol (Lopressor) two hours prior to test. If you take Lisinospril -Hydrochlorothiazide, please HOLD on the morning of the test. FEMALES- please wear underwire-free bra if available, avoid dresses & tight clothing          After the Test: Drink plenty of water. After receiving IV contrast, you may experience a mild flushed feeling. This is normal. On occasion, you may experience a mild rash up to 24 hours after the test. This is not dangerous. If  this occurs, you can take Benadryl 25 mg, Zyrtec, Claritin, or Allegra and increase your fluid intake. (Patients taking Tikosyn should avoid Benadryl, and may take Zyrtec, Claritin, or Allegra) If you experience trouble breathing, this can be serious. If it is severe call 911 IMMEDIATELY. If it is mild, please call our office.  We will call to schedule your test 2-4 weeks out understanding that some insurance companies will need an authorization prior to the service being performed.   For more information and frequently asked questions, please visit our website : [email or web address]  For non-scheduling related questions, please contact the cardiac imaging nurse navigator should you have any questions/concerns: Cardiac Imaging Nurse Navigators Direct Office Dial: [phone]   For scheduling needs, including cancellations and rescheduling, please call [place], [phone].     Follow-Up: At [facility], you and your health needs are our priority.  As part of our continuing mission to provide you with exceptional heart care, we have created designated Provider Care Teams.  These Care Teams include your primary Cardiologist (physician) and Advanced Practice Providers (APPs -  Physician Assistants and Nurse Practitioners) who all work together to provide you with the care you need, when you need it.  We recommend signing up for the patient portal called "MyChart".  Sign up information is provided on this After Visit Summary.  MyChart is used to connect with patients for Virtual Visits (Telemedicine).  Patients are able to view lab/test results, encounter notes, upcoming appointments, etc.  Non-urgent messages can be sent to your provider as well.   To learn more about what you can do with MyChart, go to [email or web address].    Your next appointment:    As needed.   Provider:   Rollene Rotunda, MD     Other Instructions

## 2023-06-08 ENCOUNTER — Encounter: Payer: Self-pay | Admitting: Cardiology

## 2023-06-09 ENCOUNTER — Other Ambulatory Visit: Payer: Self-pay | Admitting: Family Medicine

## 2023-06-09 DIAGNOSIS — Z1231 Encounter for screening mammogram for malignant neoplasm of breast: Secondary | ICD-10-CM

## 2023-06-17 DIAGNOSIS — Z853 Personal history of malignant neoplasm of breast: Secondary | ICD-10-CM | POA: Diagnosis not present

## 2023-06-17 DIAGNOSIS — N6321 Unspecified lump in the left breast, upper outer quadrant: Secondary | ICD-10-CM | POA: Diagnosis not present

## 2023-06-18 ENCOUNTER — Telehealth (HOSPITAL_COMMUNITY): Payer: Self-pay | Admitting: *Deleted

## 2023-06-18 NOTE — Telephone Encounter (Signed)
 Reaching out to patient to offer assistance regarding upcoming cardiac imaging study; pt verbalizes understanding of appt date/time, parking situation and where to check in, pre-test NPO status and medications ordered, and verified current allergies; name and call back number provided for further questions should they arise Johney Frame RN Navigator Cardiac Imaging [facility] [phone] office [phone] cell

## 2023-06-19 ENCOUNTER — Encounter (HOSPITAL_COMMUNITY): Payer: Self-pay

## 2023-06-19 ENCOUNTER — Ambulatory Visit (HOSPITAL_COMMUNITY)
Admission: RE | Admit: 2023-06-19 | Discharge: 2023-06-19 | Disposition: A | Discharge status: Home / Self Care | Source: Ambulatory Visit | Attending: Cardiology | Admitting: Cardiology

## 2023-06-19 ENCOUNTER — Other Ambulatory Visit: Payer: Self-pay | Admitting: Family Medicine

## 2023-06-19 DIAGNOSIS — R072 Precordial pain: Secondary | ICD-10-CM | POA: Diagnosis not present

## 2023-06-19 DIAGNOSIS — N6321 Unspecified lump in the left breast, upper outer quadrant: Secondary | ICD-10-CM

## 2023-06-19 MED ORDER — NITROGLYCERIN 0.4 MG SL SUBL
SUBLINGUAL_TABLET | SUBLINGUAL | Status: AC
  Filled 2023-06-19: qty 2

## 2023-06-19 MED ORDER — IOHEXOL 350 MG/ML SOLN
95.0000 mL | Freq: Once | INTRAVENOUS | Status: AC | PRN
  Administered 2023-06-19: 95 mL via INTRAVENOUS

## 2023-06-19 MED ORDER — METOPROLOL TARTRATE 5 MG/5ML IV SOLN: 10.0000 mg | Freq: Once | INTRAVENOUS | Status: DC | PRN

## 2023-06-19 MED ORDER — DILTIAZEM HCL 25 MG/5ML IV SOLN: 10.0000 mg | INTRAVENOUS | Status: DC | PRN

## 2023-06-19 MED ORDER — NITROGLYCERIN 0.4 MG SL SUBL
0.8000 mg | SUBLINGUAL_TABLET | Freq: Once | SUBLINGUAL | Status: AC
  Administered 2023-06-19: 0.8 mg via SUBLINGUAL

## 2023-06-23 ENCOUNTER — Telehealth: Payer: Self-pay | Admitting: Cardiology

## 2023-06-23 NOTE — Telephone Encounter (Signed)
 Patient identification verified by 2 forms. Marilynn Rail, RN    Called and spoke to patient  Informed patient:  -Dr. Antoine Poche has not provided input/advisement on results  -she will be outreached once results from Dr. Antoine Poche available  Patient verbalized understanding, no questions at this time

## 2023-06-23 NOTE — Telephone Encounter (Signed)
Pt calling to f/u on CT results. Please advise

## 2023-06-30 ENCOUNTER — Encounter: Payer: Self-pay | Admitting: *Deleted

## 2023-06-30 ENCOUNTER — Telehealth: Payer: Self-pay | Admitting: Cardiology

## 2023-06-30 DIAGNOSIS — I2510 Atherosclerotic heart disease of native coronary artery without angina pectoris: Secondary | ICD-10-CM

## 2023-06-30 DIAGNOSIS — E785 Hyperlipidemia, unspecified: Secondary | ICD-10-CM

## 2023-06-30 MED ORDER — PRAVASTATIN SODIUM 40 MG PO TABS: 40.0000 mg | ORAL_TABLET | Freq: Every day | ORAL | 3 refills | Status: DC

## 2023-06-30 NOTE — Telephone Encounter (Signed)
 Spoke to patient. Verified name and DOB. Relayed the following results: Eilleen Grates, MD   She had non obstructive CAD.  She does have conray calcium.  She has some coronary calcium.  She said that Crestor caused cramping.  I would suggest Pravachol 40 mg which should not cause cramping and repeat a lipid profile in 3 months.   Patient stated she has been feeling palpitations. I asked when they started, patient could not say she said they were on and off. I offered the patient an appointment with an APP. Patient stated she will monitor and call if she feels they become worse. Orders placed for Pravachol 40 mg (dc crestor) and lab work in 3 months.  Patient verbalized understanding of CT results, medication change, and follow up labs.  Josie LPN

## 2023-06-30 NOTE — Telephone Encounter (Signed)
 Patient was returning call. Please advise ?

## 2023-07-03 ENCOUNTER — Telehealth: Payer: Self-pay | Admitting: *Deleted

## 2023-07-03 ENCOUNTER — Other Ambulatory Visit

## 2023-07-03 ENCOUNTER — Encounter

## 2023-07-03 NOTE — Telephone Encounter (Signed)
 [name] ,MD

## 2023-07-03 NOTE — Telephone Encounter (Signed)
-----   Message from [name] sent at 06/26/2023 10:55 PM EDT ----- She had non obstructive CAD.  She does have conray calcium.  She has some coronary calcium.  She said that Crestor caused cramping.  I would suggest Pravachol 40 mg which should not cause cramping and repeat a lipid profile in 3 months.  Call Ms. Lautner with the results and send results to Jinger Mount, DO (Inactive)

## 2023-07-03 NOTE — Telephone Encounter (Signed)
 Call patient. Informed patient of Dr Lavonne Prairie results.  Patient states she prefer not to start Pravachol 40 mg. Patient states she thinks she has tried this before and she had sever cramps taking Pravachol.  Patient states she is still taking Rosuvastatin and CoQ10 and the cramps seems to be less.   RN informed patient will defer to Dr Lavonne Prairie.

## 2023-07-03 NOTE — Telephone Encounter (Signed)
 Patient would like to continue with Rosuvastatin 5 mg  instead of switching to Pravachol 40 mg. This okay'd by Dr Lavonne Prairie  07/03/23  Patient is aware to contact office when Rosuvastatin is needed a refill.

## 2023-07-16 ENCOUNTER — Ambulatory Visit
Admission: RE | Admit: 2023-07-16 | Discharge: 2023-07-16 | Disposition: A | Discharge status: Home / Self Care | Source: Ambulatory Visit | Attending: Family Medicine | Admitting: Family Medicine

## 2023-07-16 DIAGNOSIS — N6321 Unspecified lump in the left breast, upper outer quadrant: Secondary | ICD-10-CM | POA: Diagnosis not present

## 2023-07-21 DIAGNOSIS — H02834 Dermatochalasis of left upper eyelid: Secondary | ICD-10-CM | POA: Diagnosis not present

## 2023-07-21 DIAGNOSIS — H02413 Mechanical ptosis of bilateral eyelids: Secondary | ICD-10-CM | POA: Diagnosis not present

## 2023-07-21 DIAGNOSIS — H0261 Xanthelasma of right upper eyelid: Secondary | ICD-10-CM | POA: Diagnosis not present

## 2023-07-21 DIAGNOSIS — H57813 Brow ptosis, bilateral: Secondary | ICD-10-CM | POA: Diagnosis not present

## 2023-07-21 DIAGNOSIS — H0279 Other degenerative disorders of eyelid and periocular area: Secondary | ICD-10-CM | POA: Diagnosis not present

## 2023-07-21 DIAGNOSIS — H53483 Generalized contraction of visual field, bilateral: Secondary | ICD-10-CM | POA: Diagnosis not present

## 2023-07-21 DIAGNOSIS — H0264 Xanthelasma of left upper eyelid: Secondary | ICD-10-CM | POA: Diagnosis not present

## 2023-07-21 DIAGNOSIS — H02831 Dermatochalasis of right upper eyelid: Secondary | ICD-10-CM | POA: Diagnosis not present

## 2023-07-21 DIAGNOSIS — H538 Other visual disturbances: Secondary | ICD-10-CM | POA: Diagnosis not present

## 2023-07-21 DIAGNOSIS — H0260 Xanthelasma of unspecified eye, unspecified eyelid: Secondary | ICD-10-CM | POA: Diagnosis not present

## 2023-08-16 ENCOUNTER — Other Ambulatory Visit: Payer: Self-pay

## 2023-08-16 ENCOUNTER — Emergency Department (HOSPITAL_BASED_OUTPATIENT_CLINIC_OR_DEPARTMENT_OTHER)
Admission: EM | Admit: 2023-08-16 | Discharge: 2023-08-16 | Disposition: A | Discharge status: Home / Self Care | Attending: Emergency Medicine | Admitting: Emergency Medicine

## 2023-08-16 ENCOUNTER — Encounter (HOSPITAL_BASED_OUTPATIENT_CLINIC_OR_DEPARTMENT_OTHER): Payer: Self-pay

## 2023-08-16 DIAGNOSIS — E782 Mixed hyperlipidemia: Secondary | ICD-10-CM | POA: Diagnosis not present

## 2023-08-16 DIAGNOSIS — Z853 Personal history of malignant neoplasm of breast: Secondary | ICD-10-CM | POA: Diagnosis not present

## 2023-08-16 DIAGNOSIS — H10213 Acute toxic conjunctivitis, bilateral: Secondary | ICD-10-CM | POA: Diagnosis not present

## 2023-08-16 DIAGNOSIS — H579 Unspecified disorder of eye and adnexa: Secondary | ICD-10-CM | POA: Diagnosis present

## 2023-08-16 DIAGNOSIS — T551X1A Toxic effect of detergents, accidental (unintentional), initial encounter: Secondary | ICD-10-CM | POA: Insufficient documentation

## 2023-08-16 DIAGNOSIS — C50412 Malignant neoplasm of upper-outer quadrant of left female breast: Secondary | ICD-10-CM | POA: Diagnosis not present

## 2023-08-16 DIAGNOSIS — I10 Essential (primary) hypertension: Secondary | ICD-10-CM | POA: Diagnosis not present

## 2023-08-16 MED ORDER — TETRACAINE HCL 0.5 % OP SOLN
1.0000 [drp] | Freq: Once | OPHTHALMIC | Status: AC
  Administered 2023-08-16: 2 [drp] via OPHTHALMIC
  Filled 2023-08-16: qty 4

## 2023-08-16 NOTE — ED Notes (Signed)
 Discharge paperwork given and verbally understood.

## 2023-08-16 NOTE — Discharge Instructions (Signed)
 Please call Dr. Gregor Learned office to schedule a recheck appointment in 2-3 days. Return to the ED with any new or concerning symptoms such as eye pain, drainage, visual disturbance.   The number for Poison Control is [phone] if you want to ask any questions or need further information.

## 2023-08-16 NOTE — ED Triage Notes (Signed)
 She reports a "splash" of drain cleanier getting into both of her eyes just p.t.a. she tells me that she irrigated both eyes at home with water before coming. Upon hearing this, I immediately took her to a room where she was met by our P.A. Sherri and we irrigated bilat. Eyes with 250 cc of sterile NSS. It should be noted that the aperture of her eyes was literally insufficient to accommodate morgan lenses, therefore I and my colleague, Jerrilyn Moras held her eyelids open after sufficient numbing drops and manually irrigated both eyes. Also, we were unable to locate Ph paper.

## 2023-08-16 NOTE — ED Provider Notes (Signed)
 [place] EMERGENCY DEPARTMENT AT [facility] Provider Note   CSN: [id number] Arrival date & time: 08/16/23  1455     History  Chief Complaint  Patient presents with   Eye Problem    [name] is a 78 y.o. female.  Patient to ED after getting a household chemical splashed into both eyes just prior to arrival, about 2 hours ago. She was using [id number] sani-sticks and water from the drain splashed into the eyes causing burning. She has a bottle of eye wash at home and irrigated prior to arrival. She reports recent surgery to eye lids 3 weeks ago. She has also had surgery remotely for cataracts.   The history is provided by the patient. No language interpreter was used.  Eye Problem      Home Medications Prior to Admission medications   Medication Sig Start Date End Date Taking? Authorizing Provider  BIOTIN PO Take 550 mg by mouth.    [provider]  CALCIUM PO Take by mouth.    [provider]  co-enzyme Q-10 30 MG capsule Take 30 mg by mouth daily.    [provider]  diclofenac sodium (VOLTAREN) 1 % GEL Apply 1 application topically.    [provider]  gabapentin  (NEURONTIN ) 300 MG capsule Take 1 capsule (300 mg total) by mouth at bedtime. Patient taking differently: Take 300 mg by mouth 3 (three) times daily. 04/10/17   Magrinat, Gustav C, MD  ibuprofen (ADVIL,MOTRIN) 200 MG tablet Take 200 mg by mouth every 6 (six) hours as needed.    [provider]  LINZESS 72 MCG capsule Take 72 mcg by mouth daily. 12/30/22   [provider]  lisinopril -hydrochlorothiazide  (ZESTORETIC ) 10-12.5 MG per tablet Take 1 tablet by mouth daily. 11/16/13   Croitoru, Mihai, MD  MAGNESIUM PO Take by mouth. Patient not taking: Reported on 06/06/2023    [provider]  meloxicam (MOBIC) 7.5 MG tablet Take 7.5 mg by mouth daily. 11/01/21   [provider]  metoprolol  tartrate (LOPRESSOR ) 100 MG tablet Take 1 tablet  (100 mg total) by mouth once for 1 dose. TAKE TWO HOURS PRIOR TO  SCHEDULE CARDIAC TEST 06/06/23 06/06/23  Eilleen Grates, MD  metroNIDAZOLE (METROGEL) 0.75 % gel Apply 1 application topically daily.    [provider]  Multiple Vitamin (MULTIVITAMIN) tablet Take 1 tablet by mouth daily.    [provider]  nortriptyline (PAMELOR) 25 MG capsule Take 50 mg by mouth.  05/10/13   [provider]  Omega-3 Fatty Acids (FISH OIL PO) Take 1 each by mouth.    [provider]  pantoprazole (PROTONIX) 40 MG tablet Take 1 tablet by mouth every other day. 10/06/18   [provider]  predniSONE (STERAPRED UNI-PAK) 10 MG tablet  06/07/13   [provider]  RESTASIS 0.05 % ophthalmic emulsion Apply 2 drops to eye daily. 08/31/12   [provider]  rosuvastatin (CRESTOR) 5 MG tablet Take 5 mg by mouth daily. 07/10/21   [provider]  tretinoin (RETIN-A) 0.05 % cream Apply topically at bedtime.    [provider]  triamcinolone  cream (KENALOG ) 0.1 % Apply 1 Application topically as needed.    [provider]  venlafaxine  XR (EFFEXOR -XR) 150 MG 24 hr capsule TAKE ONE TABLET BY MOUTH EVERY DAY WITH BREAKFAST 09/01/17   Magrinat, Gustav C, MD  VITAMIN E PO Take by mouth. Patient not taking: Reported on 06/06/2023    [provider]  Allergies    Statins and Tape    Review of Systems   Review of Systems  Physical Exam Updated Vital Signs BP (!) 143/61 (BP Location: Right Arm)   Pulse 93   Resp 16   SpO2 94%  Physical Exam Constitutional:      General: She is not in acute distress.    Appearance: She is well-developed. She is not ill-appearing.  Eyes:     Comments: PERRL. No conjunctival redness or swelling. FROM. Lids appear unremarkable without swelling or redness.   Pulmonary:     Effort: Pulmonary effort is normal.  Musculoskeletal:        General: Normal range of motion.     Cervical back: Normal  range of motion.  Skin:    General: Skin is warm and dry.  Neurological:     Mental Status: She is alert and oriented to person, place, and time.     ED Results / Procedures / Treatments   Labs (all labs ordered are listed, but only abnormal results are displayed) Labs Reviewed - No data to display  EKG None  Radiology No results found.  Procedures Procedures    Medications Ordered in ED Medications  tetracaine (PONTOCAINE) 0.5 % ophthalmic solution 1-2 drop (2 drops Both Eyes Given by Other 08/16/23 1606)    ED Course/ Medical Decision Making/ A&P Clinical Course as of 08/16/23 1728  Sat Aug 16, 2023  1725 Patient with chemical exposure to bilateral eyes. Discussed with Poison Control who recommended copious irrigation. No photophobia and no pain with eye movement. Attempt to evaluate eye pH unsuccessful as litmus paper unable to be located.  [SU]  1727 Per poison control, no further management required. Patient provided the phone contact for poison control. Discussed return precautions.  [SU]    Clinical Course User Index [SU] Mandy Second, PA-C                                 Medical Decision Making Risk Prescription drug management.           Final Clinical Impression(s) / ED Diagnoses Final diagnoses:  Chemical conjunctivitis of both eyes    Rx / DC Orders ED Discharge Orders     None         Rama Burkitt 08/16/23 1728    Tonya Fredrickson, MD 08/17/23 1108

## 2023-09-05 DIAGNOSIS — M2042 Other hammer toe(s) (acquired), left foot: Secondary | ICD-10-CM | POA: Diagnosis not present

## 2023-09-05 DIAGNOSIS — M21612 Bunion of left foot: Secondary | ICD-10-CM | POA: Diagnosis not present

## 2023-09-05 DIAGNOSIS — M2022 Hallux rigidus, left foot: Secondary | ICD-10-CM | POA: Diagnosis not present

## 2023-09-12 DIAGNOSIS — H16142 Punctate keratitis, left eye: Secondary | ICD-10-CM | POA: Diagnosis not present

## 2023-09-15 DIAGNOSIS — E782 Mixed hyperlipidemia: Secondary | ICD-10-CM | POA: Diagnosis not present

## 2023-09-15 DIAGNOSIS — C50412 Malignant neoplasm of upper-outer quadrant of left female breast: Secondary | ICD-10-CM | POA: Diagnosis not present

## 2023-09-15 DIAGNOSIS — I10 Essential (primary) hypertension: Secondary | ICD-10-CM | POA: Diagnosis not present

## 2023-09-15 DIAGNOSIS — Z853 Personal history of malignant neoplasm of breast: Secondary | ICD-10-CM | POA: Diagnosis not present

## 2023-09-25 DIAGNOSIS — H16142 Punctate keratitis, left eye: Secondary | ICD-10-CM | POA: Diagnosis not present

## 2023-10-03 DIAGNOSIS — H16142 Punctate keratitis, left eye: Secondary | ICD-10-CM | POA: Diagnosis not present

## 2023-10-16 DIAGNOSIS — E782 Mixed hyperlipidemia: Secondary | ICD-10-CM | POA: Diagnosis not present

## 2023-10-16 DIAGNOSIS — I10 Essential (primary) hypertension: Secondary | ICD-10-CM | POA: Diagnosis not present

## 2023-10-16 DIAGNOSIS — C50412 Malignant neoplasm of upper-outer quadrant of left female breast: Secondary | ICD-10-CM | POA: Diagnosis not present

## 2023-10-16 DIAGNOSIS — Z853 Personal history of malignant neoplasm of breast: Secondary | ICD-10-CM | POA: Diagnosis not present

## 2023-10-20 DIAGNOSIS — M2042 Other hammer toe(s) (acquired), left foot: Secondary | ICD-10-CM | POA: Diagnosis not present

## 2023-10-20 DIAGNOSIS — M21612 Bunion of left foot: Secondary | ICD-10-CM | POA: Diagnosis not present

## 2023-10-21 DIAGNOSIS — M2550 Pain in unspecified joint: Secondary | ICD-10-CM | POA: Diagnosis not present

## 2023-10-21 DIAGNOSIS — Z79899 Other long term (current) drug therapy: Secondary | ICD-10-CM | POA: Diagnosis not present

## 2023-11-16 DIAGNOSIS — E782 Mixed hyperlipidemia: Secondary | ICD-10-CM | POA: Diagnosis not present

## 2023-11-16 DIAGNOSIS — I10 Essential (primary) hypertension: Secondary | ICD-10-CM | POA: Diagnosis not present

## 2023-11-16 DIAGNOSIS — C50412 Malignant neoplasm of upper-outer quadrant of left female breast: Secondary | ICD-10-CM | POA: Diagnosis not present

## 2023-11-16 DIAGNOSIS — Z853 Personal history of malignant neoplasm of breast: Secondary | ICD-10-CM | POA: Diagnosis not present

## 2023-12-03 DIAGNOSIS — M2042 Other hammer toe(s) (acquired), left foot: Secondary | ICD-10-CM | POA: Diagnosis not present

## 2023-12-03 DIAGNOSIS — M21612 Bunion of left foot: Secondary | ICD-10-CM | POA: Diagnosis not present

## 2023-12-11 DIAGNOSIS — Z23 Encounter for immunization: Secondary | ICD-10-CM | POA: Diagnosis not present

## 2023-12-16 DIAGNOSIS — C50412 Malignant neoplasm of upper-outer quadrant of left female breast: Secondary | ICD-10-CM | POA: Diagnosis not present

## 2023-12-16 DIAGNOSIS — E782 Mixed hyperlipidemia: Secondary | ICD-10-CM | POA: Diagnosis not present

## 2023-12-16 DIAGNOSIS — Z853 Personal history of malignant neoplasm of breast: Secondary | ICD-10-CM | POA: Diagnosis not present

## 2023-12-16 DIAGNOSIS — I10 Essential (primary) hypertension: Secondary | ICD-10-CM | POA: Diagnosis not present

## 2023-12-30 ENCOUNTER — Telehealth: Payer: Self-pay | Admitting: Cardiology

## 2023-12-30 NOTE — Telephone Encounter (Signed)
 Pt c/o swelling/edema: STAT if pt has developed SOB within 24 hours  If swelling, where is the swelling located? Legs but when she lays down it goes away   How much weight have you gained and in what time span? No   Have you gained 2 pounds in a day or 5 pounds in a week? No   Do you have a log of your daily weights (if so, list)? States she is the same weight around 148   Are you currently taking a fluid pill? She states she is taking - lisinopril -hydrochlorothiazide .   Are you currently SOB? No   Have you traveled recently in a car or plane for an extended period of time? 2-3 hrs going to the beach 10/4-10/6

## 2023-12-30 NOTE — Telephone Encounter (Signed)
 Lavona Agent, MD to Catherine Avelina CROME, RN     12/30/23  3:15 PM Agree with suggestions.   S/w the patient and given info above. Asked her to let us  know if she gains 2-3 pounds overnight or 5 pounds in a week or develops any shortness of breath. She verbalized understanding.

## 2023-12-30 NOTE — Telephone Encounter (Signed)
 I spoke with patient. She reports she will have periodic swelling in her ankles.  Usually in both ankles but sometimes right is a little more swollen.  Does not happen daily and has been going on for several months.  Reports she has broken veins in her foot but this is not new. No shortness of breath.  Swelling goes down over night but worsens during the day.  She is on her feet a lot during the day.  Tries to watch salt intake but does like eating popcorn.  I advised patient to limit salt intake and try to keep her legs elevated when possible. Will forward to Dr Lavona for review/recommendations.

## 2024-01-16 DIAGNOSIS — C50412 Malignant neoplasm of upper-outer quadrant of left female breast: Secondary | ICD-10-CM | POA: Diagnosis not present

## 2024-01-16 DIAGNOSIS — E782 Mixed hyperlipidemia: Secondary | ICD-10-CM | POA: Diagnosis not present

## 2024-01-16 DIAGNOSIS — I10 Essential (primary) hypertension: Secondary | ICD-10-CM | POA: Diagnosis not present

## 2024-01-16 DIAGNOSIS — Z853 Personal history of malignant neoplasm of breast: Secondary | ICD-10-CM | POA: Diagnosis not present

## 2024-01-28 DIAGNOSIS — Z6824 Body mass index (BMI) 24.0-24.9, adult: Secondary | ICD-10-CM | POA: Diagnosis not present

## 2024-01-28 DIAGNOSIS — J019 Acute sinusitis, unspecified: Secondary | ICD-10-CM | POA: Diagnosis not present

## 2024-01-28 DIAGNOSIS — R051 Acute cough: Secondary | ICD-10-CM | POA: Diagnosis not present

## 2024-02-02 DIAGNOSIS — R051 Acute cough: Secondary | ICD-10-CM | POA: Diagnosis not present

## 2024-02-10 DIAGNOSIS — F329 Major depressive disorder, single episode, unspecified: Secondary | ICD-10-CM | POA: Diagnosis not present

## 2024-02-10 DIAGNOSIS — Z6824 Body mass index (BMI) 24.0-24.9, adult: Secondary | ICD-10-CM | POA: Diagnosis not present

## 2024-02-10 DIAGNOSIS — Z1331 Encounter for screening for depression: Secondary | ICD-10-CM | POA: Diagnosis not present

## 2024-02-10 DIAGNOSIS — K219 Gastro-esophageal reflux disease without esophagitis: Secondary | ICD-10-CM | POA: Diagnosis not present

## 2024-02-10 DIAGNOSIS — Z0000 Encounter for general adult medical examination without abnormal findings: Secondary | ICD-10-CM | POA: Diagnosis not present

## 2024-02-10 DIAGNOSIS — G90511 Complex regional pain syndrome I of right upper limb: Secondary | ICD-10-CM | POA: Diagnosis not present

## 2024-02-10 DIAGNOSIS — E782 Mixed hyperlipidemia: Secondary | ICD-10-CM | POA: Diagnosis not present

## 2024-02-10 DIAGNOSIS — M8589 Other specified disorders of bone density and structure, multiple sites: Secondary | ICD-10-CM | POA: Diagnosis not present

## 2024-02-10 DIAGNOSIS — I10 Essential (primary) hypertension: Secondary | ICD-10-CM | POA: Diagnosis not present

## 2024-02-10 DIAGNOSIS — M1990 Unspecified osteoarthritis, unspecified site: Secondary | ICD-10-CM | POA: Diagnosis not present

## 2024-02-15 DIAGNOSIS — I10 Essential (primary) hypertension: Secondary | ICD-10-CM | POA: Diagnosis not present

## 2024-02-15 DIAGNOSIS — E782 Mixed hyperlipidemia: Secondary | ICD-10-CM | POA: Diagnosis not present

## 2024-02-15 DIAGNOSIS — C50412 Malignant neoplasm of upper-outer quadrant of left female breast: Secondary | ICD-10-CM | POA: Diagnosis not present

## 2024-02-15 DIAGNOSIS — Z853 Personal history of malignant neoplasm of breast: Secondary | ICD-10-CM | POA: Diagnosis not present
